# Patient Record
Sex: Female | Born: 1990 | Race: Black or African American | Hispanic: Yes | Marital: Single | Smoking: Former smoker
Health system: Southern US, Community
[De-identification: ages and names within clinical notes are randomized; demographics above are authoritative.]

## PROBLEM LIST (undated history)

## (undated) ENCOUNTER — Inpatient Hospital Stay (HOSPITAL_COMMUNITY): Payer: Self-pay

## (undated) DIAGNOSIS — N739 Female pelvic inflammatory disease, unspecified: Secondary | ICD-10-CM

## (undated) DIAGNOSIS — F32A Depression, unspecified: Secondary | ICD-10-CM

## (undated) DIAGNOSIS — L509 Urticaria, unspecified: Secondary | ICD-10-CM

## (undated) DIAGNOSIS — F419 Anxiety disorder, unspecified: Secondary | ICD-10-CM

## (undated) DIAGNOSIS — Z8619 Personal history of other infectious and parasitic diseases: Secondary | ICD-10-CM

## (undated) DIAGNOSIS — G43909 Migraine, unspecified, not intractable, without status migrainosus: Secondary | ICD-10-CM

## (undated) DIAGNOSIS — D649 Anemia, unspecified: Secondary | ICD-10-CM

## (undated) DIAGNOSIS — F329 Major depressive disorder, single episode, unspecified: Secondary | ICD-10-CM

## (undated) DIAGNOSIS — B009 Herpesviral infection, unspecified: Secondary | ICD-10-CM

## (undated) DIAGNOSIS — M1990 Unspecified osteoarthritis, unspecified site: Secondary | ICD-10-CM

## (undated) DIAGNOSIS — R064 Hyperventilation: Secondary | ICD-10-CM

## (undated) DIAGNOSIS — T783XXA Angioneurotic edema, initial encounter: Secondary | ICD-10-CM

## (undated) DIAGNOSIS — J45909 Unspecified asthma, uncomplicated: Secondary | ICD-10-CM

## (undated) HISTORY — DX: Unspecified asthma, uncomplicated: J45.909

## (undated) HISTORY — DX: Major depressive disorder, single episode, unspecified: F32.9

## (undated) HISTORY — DX: Urticaria, unspecified: L50.9

## (undated) HISTORY — DX: Personal history of other infectious and parasitic diseases: Z86.19

## (undated) HISTORY — DX: Migraine, unspecified, not intractable, without status migrainosus: G43.909

## (undated) HISTORY — DX: Angioneurotic edema, initial encounter: T78.3XXA

## (undated) HISTORY — DX: Unspecified osteoarthritis, unspecified site: M19.90

## (undated) HISTORY — DX: Female pelvic inflammatory disease, unspecified: N73.9

## (undated) HISTORY — DX: Depression, unspecified: F32.A

## (undated) HISTORY — DX: Herpesviral infection, unspecified: B00.9

---
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
## 2012-01-08 ENCOUNTER — Encounter (HOSPITAL_COMMUNITY): Payer: Self-pay | Admitting: Cardiology

## 2012-01-08 ENCOUNTER — Emergency Department (HOSPITAL_COMMUNITY)
Admission: EM | Admit: 2012-01-08 | Discharge: 2012-01-08 | Disposition: A | Discharge status: Home / Self Care | Payer: Medicaid Other | Source: Home / Self Care

## 2012-01-08 DIAGNOSIS — R531 Weakness: Secondary | ICD-10-CM

## 2012-01-08 DIAGNOSIS — R5383 Other fatigue: Secondary | ICD-10-CM

## 2012-01-08 HISTORY — DX: Anemia, unspecified: D64.9

## 2012-01-08 HISTORY — DX: Hyperventilation: R06.4

## 2012-01-08 MED ORDER — IBUPROFEN 800 MG PO TABS: 800.0000 mg | ORAL_TABLET | Freq: Three times a day (TID) | ORAL | Status: AC

## 2012-01-08 MED ORDER — IBUPROFEN 800 MG PO TABS
800.0000 mg | ORAL_TABLET | Freq: Once | ORAL | Status: AC
  Administered 2012-01-08: 800 mg via ORAL

## 2012-01-08 MED ORDER — IBUPROFEN 800 MG PO TABS
ORAL_TABLET | ORAL | Status: AC
  Filled 2012-01-08: qty 1

## 2012-01-08 NOTE — ED Provider Notes (Signed)
History     CSN: [id number]  Arrival date & time 01/08/12  1907   None     Chief Complaint  Patient presents with  . Near Syncope  . Cough  . Chills    (Consider location/radiation/quality/duration/timing/severity/associated sxs/prior treatment) Patient is a 21 y.o. female presenting with cough. The history is provided by the patient. No language interpreter was used.  Cough This is a new problem. The current episode started 1 to 2 hours ago. The problem has been gradually worsening. The cough is non-productive. There has been no fever. Associated symptoms include chills and sweats. Pertinent negatives include no shortness of breath. The treatment provided no relief. She is not a smoker. Her past medical history is significant for pneumonia.  hx of anemia,  No medical doctor,  Pt stopped taking vitamins.  Pt reports she has not been eating or drinking normally,  Pt began feeling bad at work [employer].  Pt felt like she would pass out.   Past Medical History  Diagnosis Date  . Anemia   . Hyperventilation     History reviewed. No pertinent past surgical history.  Family History  Problem Relation Age of Onset  . Hypertension Other   . Diabetes Other   . Cancer Other     History  Substance Use Topics  . Smoking status: Never Smoker   . Smokeless tobacco: Not on file  . Alcohol Use: No    OB History    Grav Para Term Preterm Abortions TAB SAB Ect Mult Living                  Review of Systems  Constitutional: Positive for chills.  Respiratory: Positive for cough. Negative for shortness of breath.     Allergies  Review of patient's allergies indicates no known allergies.  Home Medications   Current Outpatient Rx  Name Route Sig Dispense Refill  . MULTI-VITAMIN/MINERALS PO TABS Oral Take 1 tablet by mouth daily.      Marland Kitchen NORGESTIMATE-ETH ESTRADIOL 0.25-35 MG-MCG PO TABS Oral Take 1 tablet by mouth daily.        BP 117/68  Pulse 79  Temp(Src) 98.2 F (36.8 C)  (Oral)  Resp 22  SpO2 100%  LMP 01/01/2012  Physical Exam  Nursing note and vitals reviewed. Constitutional: She is oriented to person, place, and time. She appears well-developed and well-nourished.  HENT:  Head: Normocephalic and atraumatic.  Right Ear: External ear normal.  Mouth/Throat: Oropharynx is clear and moist.  Eyes: Conjunctivae and EOM are normal. Pupils are equal, round, and reactive to light.  Neck: Normal range of motion.  Cardiovascular: Normal rate and normal heart sounds.   Pulmonary/Chest: Effort normal.  Abdominal: Soft.  Musculoskeletal: Normal range of motion.  Neurological: She is alert and oriented to person, place, and time.  Psychiatric: She has a normal mood and affect.    ED Course  Procedures (including critical care time)  Labs Reviewed - No data to display No results found.   No diagnosis found.    MDM  Pt given ibuprofen,  I advised her she needs a primary MD Pt given referral number       Langston Masker, [place] 01/08/12 2110

## 2012-01-08 NOTE — ED Notes (Signed)
Pt states she was at work started feeling tired and hot. Had episode of hyperventilation. Pt had chills and head started hurting. Has history of hyperventilation.  Had sore throat last week. Has occasional cough with some mucus.

## 2012-01-10 NOTE — ED Provider Notes (Signed)
Dx: weakness Medical screening examination/treatment/procedure(s) were performed by non-physician practitioner and as supervising physician I was immediately available for consultation/collaboration.  Luiz Blare MD   Luiz Blare, MD 01/10/12 [phone]

## 2012-06-26 ENCOUNTER — Encounter (HOSPITAL_COMMUNITY): Payer: Self-pay | Admitting: Emergency Medicine

## 2012-06-26 ENCOUNTER — Emergency Department (INDEPENDENT_AMBULATORY_CARE_PROVIDER_SITE_OTHER)
Admission: EM | Admit: 2012-06-26 | Discharge: 2012-06-26 | Disposition: A | Discharge status: Home / Self Care | Payer: Self-pay | Source: Home / Self Care | Attending: Family Medicine | Admitting: Family Medicine

## 2012-06-26 DIAGNOSIS — H00029 Hordeolum internum unspecified eye, unspecified eyelid: Secondary | ICD-10-CM

## 2012-06-26 MED ORDER — MOXIFLOXACIN HCL 0.5 % OP SOLN: 1.0000 [drp] | Freq: Three times a day (TID) | OPHTHALMIC | Status: AC

## 2012-06-26 MED ORDER — MOXIFLOXACIN HCL 0.5 % OP SOLN: 1.0000 [drp] | Freq: Three times a day (TID) | OPHTHALMIC | Status: DC

## 2012-06-26 NOTE — ED Notes (Signed)
Eye pain onset Sunday night.  Has been using warm compresses

## 2012-06-26 NOTE — ED Provider Notes (Signed)
History     CSN: [id number]  Arrival date & time 06/26/12  1244   First MD Initiated Contact with Patient 06/26/12 1330      No chief complaint on file.   (Consider location/radiation/quality/duration/timing/severity/associated sxs/prior treatment) Patient is a 21 y.o. female presenting with eye problem. The history is provided by the patient and a parent.  Eye Problem  This is a new problem. The current episode started more than 2 days ago. The problem has been gradually worsening. There is pain in the right eye. There was no injury mechanism. The pain is mild. There is no history of trauma to the eye. There is no known exposure to pink eye. She does not wear contacts. Associated symptoms include discharge, eye redness and itching. Pertinent negatives include no blurred vision, no decreased vision, no photophobia, no nausea and no vomiting. She has tried water for the symptoms. The treatment provided mild relief.    Past Medical History  Diagnosis Date  . Anemia   . Hyperventilation     No past surgical history on file.  Family History  Problem Relation Age of Onset  . Hypertension Other   . Diabetes Other   . Cancer Other     History  Substance Use Topics  . Smoking status: Never Smoker   . Smokeless tobacco: Not on file  . Alcohol Use: No    OB History    Grav Para Term Preterm Abortions TAB SAB Ect Mult Living                  Review of Systems  Constitutional: Negative.   HENT: Negative.   Eyes: Positive for discharge, redness and itching. Negative for blurred vision, photophobia, pain and visual disturbance.  Gastrointestinal: Negative for nausea and vomiting.  Skin: Positive for itching.    Allergies  Review of patient's allergies indicates no known allergies.  Home Medications   Current Outpatient Rx  Name Route Sig Dispense Refill  . MOXIFLOXACIN HCL 0.5 % OP SOLN Right Eye Place 1 drop into the right eye 3 (three) times daily. Use after warm  compress to eye. 3 mL 0  . MULTI-VITAMIN/MINERALS PO TABS Oral Take 1 tablet by mouth daily.      Marland Kitchen NORGESTIMATE-ETH ESTRADIOL 0.25-35 MG-MCG PO TABS Oral Take 1 tablet by mouth daily.        There were no vitals taken for this visit.  Physical Exam  Nursing note and vitals reviewed. Constitutional: She appears well-developed and well-nourished.  HENT:  Head: Normocephalic.  Right Ear: External ear normal.  Left Ear: External ear normal.  Mouth/Throat: Oropharynx is clear and moist.  Eyes: EOM are normal. Pupils are equal, round, and reactive to light. No foreign bodies found. Right eye exhibits discharge and exudate. Left eye exhibits no discharge and no exudate. Right conjunctiva is injected. Left conjunctiva is not injected.    ED Course  Procedures (including critical care time)  Labs Reviewed - No data to display No results found.   1. Sty, internal       MDM          Linna Hoff, MD 06/26/12 1425

## 2012-06-26 NOTE — ED Notes (Signed)
Call from patient, requesting less costly Rx , and to have it called in to [facility]. Spoke w Dr Caron Presume, who authorized tobrex opth solution, 1 drop , TID to her affected eye, after a warm compress. Left message on voice mail at pharmacy

## 2012-07-03 IMAGING — US US PELVIS COMPLETE
1 series · 13 of 25 positions shown · non-contrast
Comparison: CT 07/24/2011

CLINICAL DATA: Lower abdominal pain.  Left ovarian cyst on CT

TRANSABDOMINAL AND TRANSVAGINAL ULTRASOUND OF PELVIS
DOPPLER ULTRASOUND OF OVARIES
TECHNIQUE: Both transabdominal and transvaginal ultrasound
examinations of the pelvis were performed. Transabdominal technique
was performed for global imaging of the pelvis including uterus,
ovaries, adnexal regions, and pelvic cul-de-sac.
It was necessary to proceed with endovaginal exam following the
transabdominal exam to visualize the ovaries and uterus.
Color and duplex Doppler ultrasound was utilized to evaluate blood
flow to the ovaries.

[Series 1: us pelvis complete · 0.30mm/px · 13 of 42 slices shown]
[im 1/42]
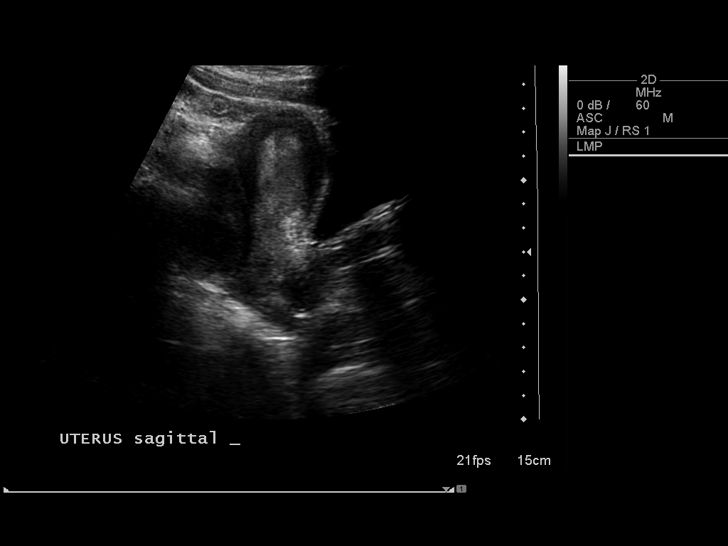
[im 4/42]
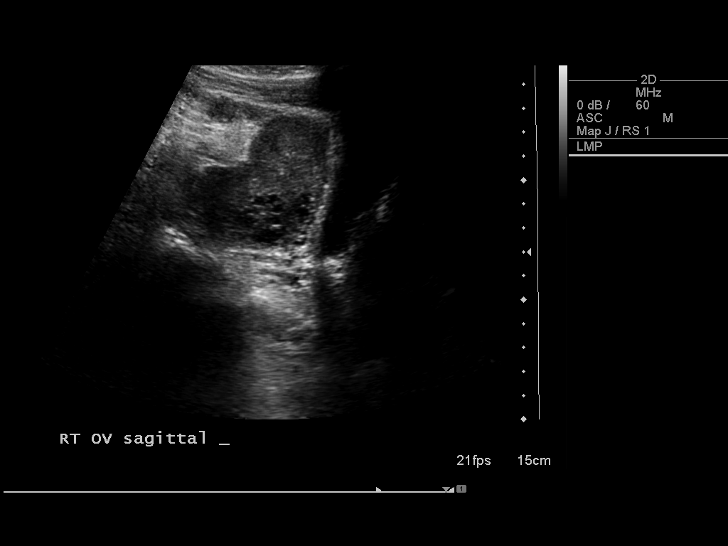
[im 7/42]
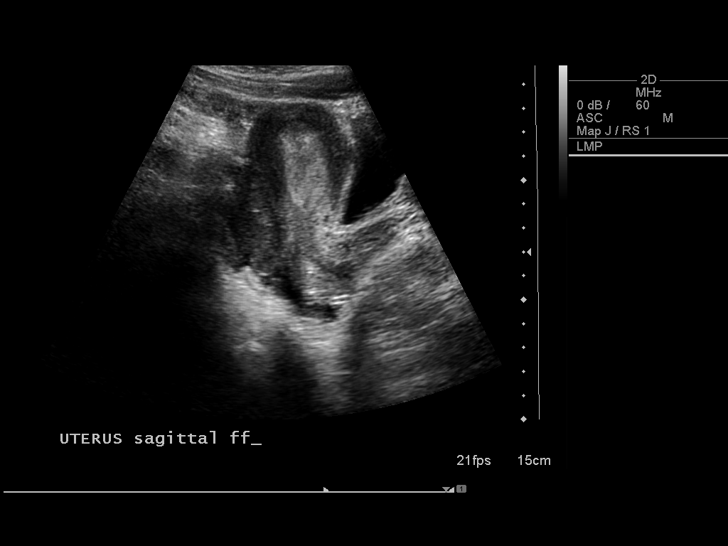
[im 11/42]
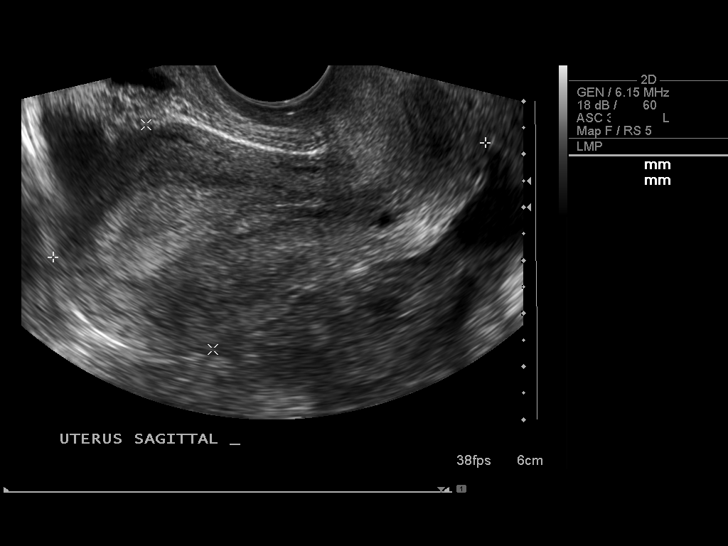
[im 14/42]
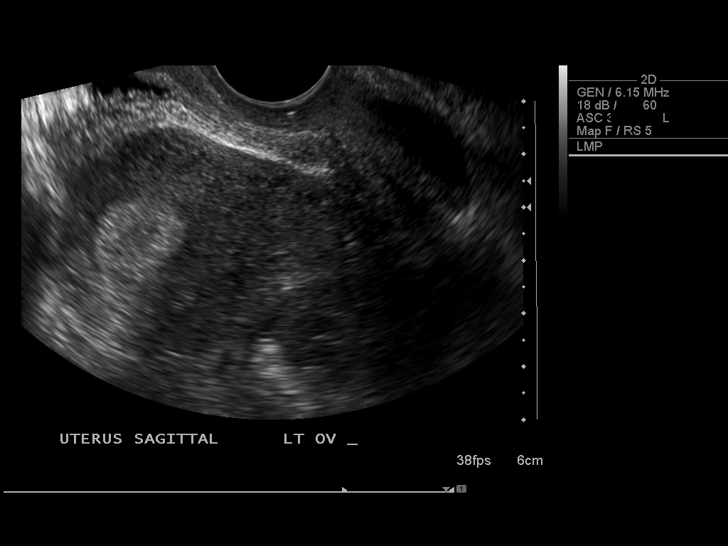
[im 18/42]
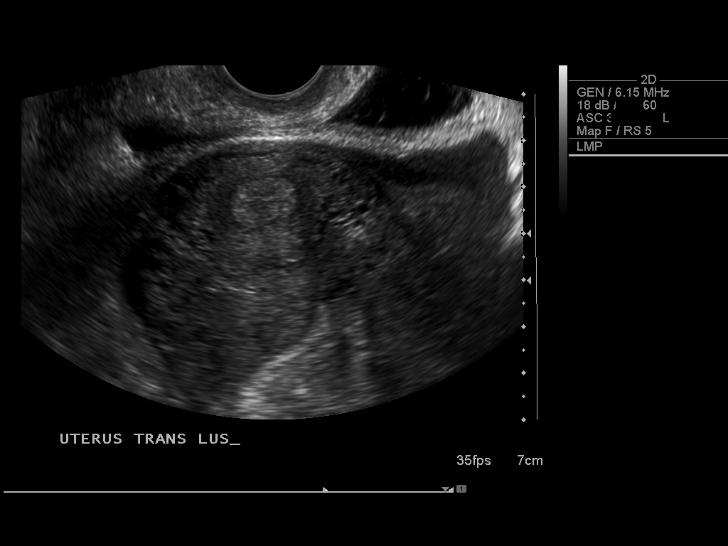
[im 21/42]
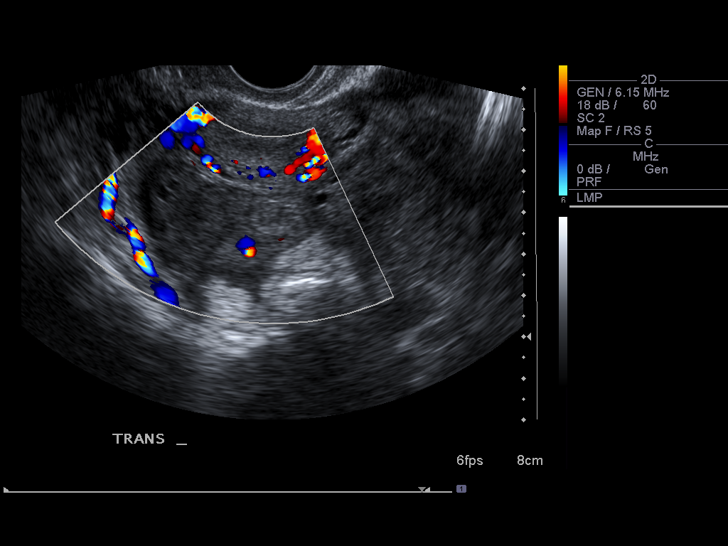
[im 24/42]
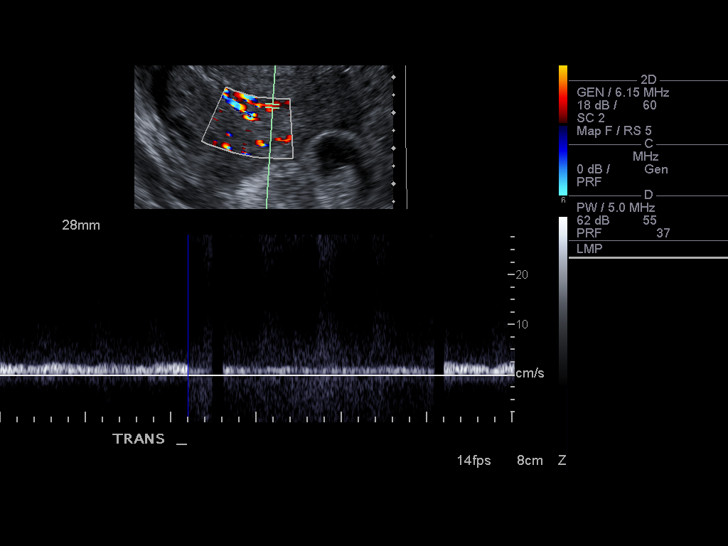
[im 28/42]
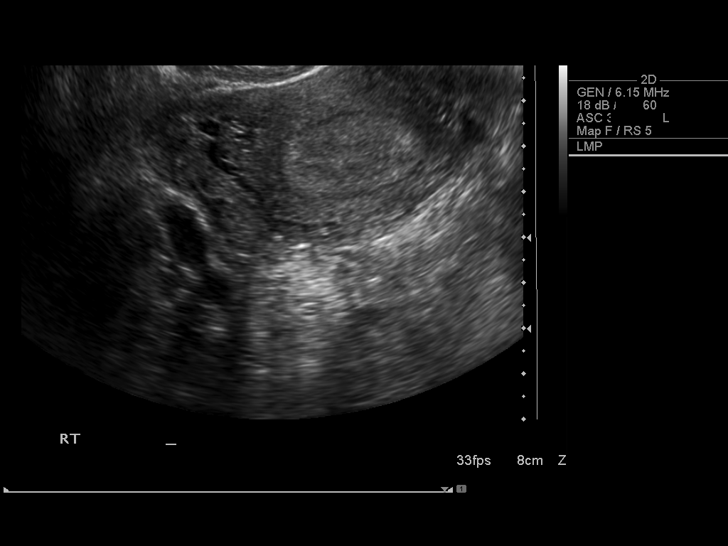
[im 31/42]
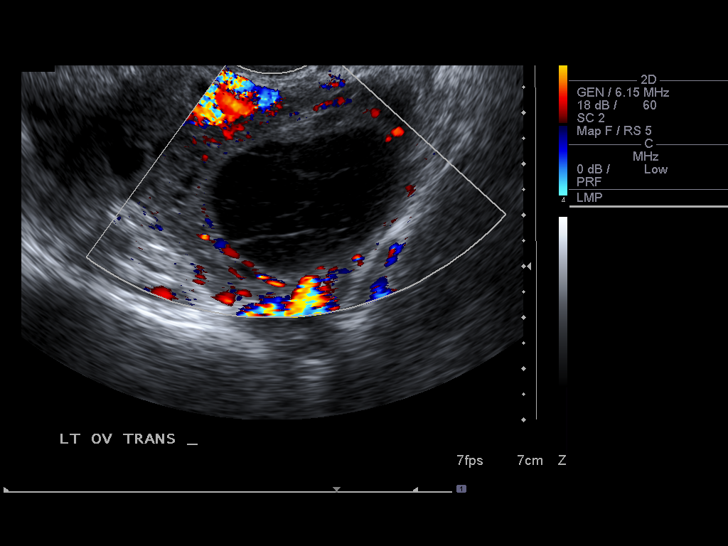
[im 35/42]
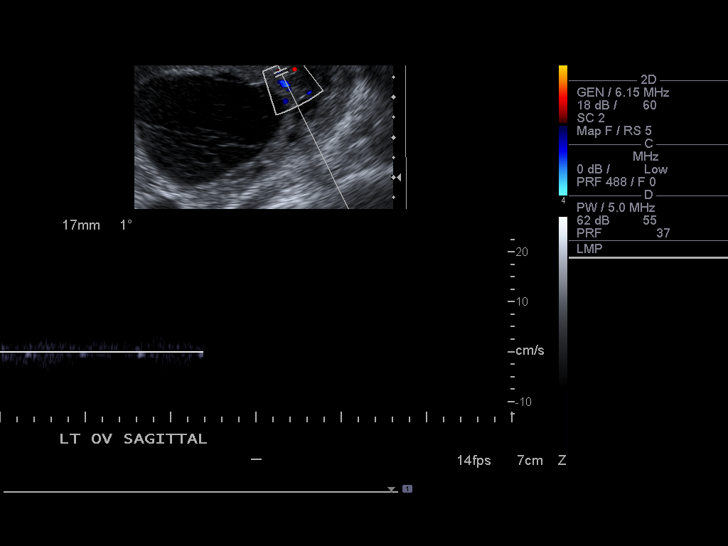
[im 38/42]
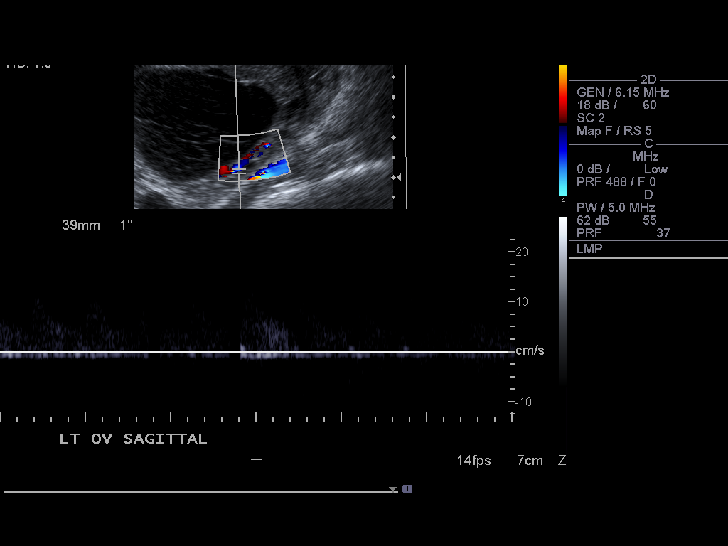
[im 42/42]
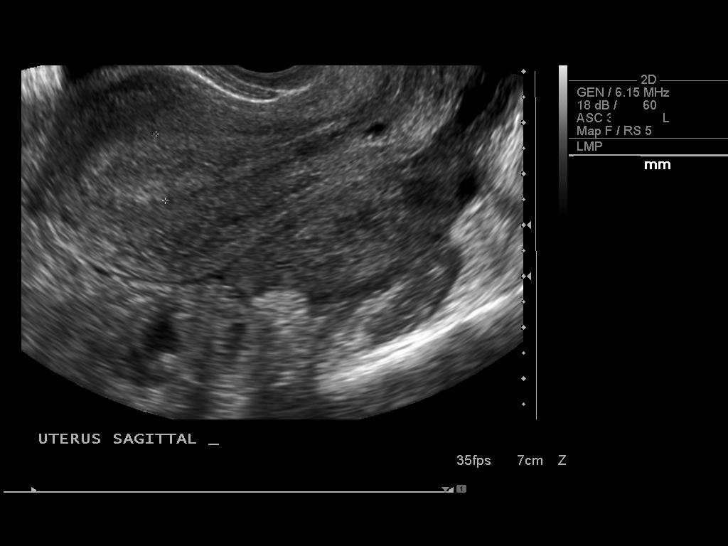

[13 of 25 positions shown; findings below may reference images not displayed]

FINDINGS: Uterus:  Normal in size and appearance

Endometrium:  Thickened endometrium measuring 1.6 cm.  Endometrium
is homogeneous.

Right ovary: Normal appearance/no adnexal mass

Left ovary:   Complex cyst left ovary measures 4 x 4.1 cm.
Internal septations are present.

Pulsed Doppler evaluation demonstrates normal low-resistance
arterial and venous waveforms in both ovaries.

Small amount of free fluid.
IMPRESSION: 4 cm complex cyst left ovary with a small amount of free fluid.
This is most likely a functional cyst.

No sonographic evidence for ovarian torsion.

## 2012-07-09 ENCOUNTER — Other Ambulatory Visit: Payer: Self-pay | Admitting: Obstetrics & Gynecology

## 2012-07-09 ENCOUNTER — Ambulatory Visit (INDEPENDENT_AMBULATORY_CARE_PROVIDER_SITE_OTHER): Payer: Medicaid Other | Admitting: Obstetrics & Gynecology

## 2012-07-09 ENCOUNTER — Encounter: Payer: Self-pay | Admitting: Obstetrics & Gynecology

## 2012-07-09 VITALS — BP 97/67 | HR 76 | Temp 98.1°F | Ht 64.0 in | Wt 149.9 lb

## 2012-07-09 DIAGNOSIS — N949 Unspecified condition associated with female genital organs and menstrual cycle: Secondary | ICD-10-CM

## 2012-07-09 DIAGNOSIS — R102 Pelvic and perineal pain: Secondary | ICD-10-CM

## 2012-07-09 DIAGNOSIS — N730 Acute parametritis and pelvic cellulitis: Secondary | ICD-10-CM

## 2012-07-09 DIAGNOSIS — N739 Female pelvic inflammatory disease, unspecified: Secondary | ICD-10-CM

## 2012-07-09 MED ORDER — CEFTRIAXONE SODIUM 250 MG IJ SOLR: 250.0000 mg | Freq: Once | INTRAMUSCULAR | Status: DC

## 2012-07-09 MED ORDER — CEFTRIAXONE SODIUM 1 G IJ SOLR
250.0000 mg | Freq: Once | INTRAMUSCULAR | Status: AC
  Administered 2012-07-09: 250 mg via INTRAMUSCULAR

## 2012-07-09 MED ORDER — AZITHROMYCIN 250 MG PO TABS: 1000.0000 mg | ORAL_TABLET | Freq: Once | ORAL | Status: AC

## 2012-07-09 NOTE — Patient Instructions (Signed)
Pelvic Inflammatory Disease  Pelvic Inflammatory Disease (PID) is an infection in some or all of your female organs. This includes the womb (uterus), ovaries, fallopian tubes and tissues in the pelvis. PID is a common cause of sudden onset (acute) lower abdominal (pelvic) pain. PID can be treated, but it is a serious infection. It may take weeks before you are completely well. In some cases, hospitalization is needed for surgery or to administer medications to kill germs (antibiotics) through your veins (intravenously).  CAUSES    It may be caused by germs that are spread during sexual contact.   PID can also occur following:   The birth of a baby.   A miscarriage.   An abortion.   Major surgery of the pelvis.   Use of an IUD.   Sexual assault.  SYMPTOMS    Abdominal or pelvic pain.   Fever.   Chills.   Abnormal vaginal discharge.  DIAGNOSIS   Your caregiver will choose some of these methods to make a diagnosis:   A physical exam and history.   Blood tests.   Cultures of the vagina and cervix.   X-rays or ultrasound.   A procedure to look inside the pelvis (laparoscopy).  TREATMENT    Use of antibiotics by mouth or intravenously.   Treatment of sexual partners when the infection is an sexually transmitted disease (STD).   Hospitalization and surgery may be needed.  RISKS AND COMPLICATIONS    PID can cause women to become unable to have children (sterile) if left untreated or if partially treated. That is why it is important to finish all medications given to you.   Sterility or future tubal (ectopic) pregnancies can occur in fully treated individuals. This is why it is so important to follow your prescribed treatment.   It can cause longstanding (chronic) pelvic pain after frequent infections.   Painful intercourse.   Pelvic abscesses.   In rare cases, surgery or a hysterectomy may be needed.   If this is a sexually transmitted infection (STI), you are also at risk for any other STD  including AIDSor human papillomavirus (HPV).  HOME CARE INSTRUCTIONS    Finish all medication as prescribed. Incomplete treatment will put you at risk for sterility and tubal pregnancy.   Only take over-the-counter or prescription medicines for pain, discomfort, or fever as directed by your caregiver.   Do not have sex until treatment is completed or as directed by your caregiver. If PID is confirmed, your recent sexual contacts will need treatment.   Keep your follow-up appointments.  SEEK MEDICAL CARE IF:    You have increased or abnormal vaginal discharge.   You need prescription medication for your pain.   Your partner has an STD.   You are vomiting.   You cannot take your medications.  SEEK IMMEDIATE MEDICAL CARE IF:    You have a fever.   You develop increased abdominal or pelvic pain.   You develop chills.   You have pain when you urinate.   You are not better after 72 hours following treatment.  Document Released: 12/09/2005 Document Revised: 11/28/2011 Document Reviewed: 08/22/2007  [employer] Patient Information 2012 [employer]

## 2012-07-09 NOTE — Progress Notes (Signed)
Subjective:     Patient ID: Shelby Graves, female   DOB: January 03, 1991, 21 y.o.   MRN: [id number]  HPI Patient is a 21 yo woman with PMH of functional left ovarian cyst who presents with left sided pelvic pain and irregular menses. Patient reports that July 2012 she was diagnosed with functional L ovarian cyst and given ortho-cyclen. Her menses corrected and her pelvic pain resolved, however 2/2 headaches and blurry vision she stopped taking the birth control in December 2012. Patient then reports her periods became irregular again and she began having heavy bleeding with clots alternating with spotty bleeding. She currently reports continued left pelvic pain that is constant in nature, 6/10 and nothing seems to make the pain better or worse. She has not tried taking anything for the pain as well. She does not report the pain being precipitated by anything and the pain feels the same, not better nor worse. She denies any fevers, chills, night sweats, N/V/D, bladder or bowel changes. Patient also reports in early 2012 (approx. February/March) she began gaining weight and developing irregular hair growth. Over the past 1 yr she has gained approx. 30 lbs and denies any changes in diet or physical activity. She has also been tweezing the irregular hair growth on her neck and chest.   Review of Systems  Constitutional: Negative.   Eyes: Negative.   Respiratory: Negative.   Cardiovascular: Negative.   Gastrointestinal: Negative.   Genitourinary: Positive for vaginal bleeding, vaginal discharge and pelvic pain ( Left). Negative for difficulty urinating.  Musculoskeletal: Negative.   Neurological: Positive for headaches.  Hematological: Negative.   Psychiatric/Behavioral: Negative.        Objective:   Physical Exam  Constitutional: She is oriented to person, place, and time. She appears well-developed and well-nourished.  HENT:  Head: Normocephalic and atraumatic.  Eyes: Conjunctivae are normal.    Neck: Normal range of motion. Neck supple.  Cardiovascular: Normal rate, regular rhythm and normal heart sounds.  Exam reveals no friction rub.   No murmur heard. Pulmonary/Chest: Effort normal and breath sounds normal.  Abdominal: Soft. Bowel sounds are normal.  Genitourinary: Cervix exhibits motion tenderness and discharge ( blood at os). Right adnexum displays tenderness and fullness. Left adnexum displays tenderness and fullness. Vaginal discharge found.  Musculoskeletal: Normal range of motion.  Neurological: She is alert and oriented to person, place, and time.  Skin: Skin is warm and dry.  Psychiatric: She has a normal mood and affect. Her behavior is normal. Judgment and thought content normal.       Assessment:     1) PID - patient has B/L adnexal tenderness and CMT, recent sexual intercourse. GC/CHL and UPT obtained. 2) Possible PCOS - will treat for PID first, then re-evaluate PCOS at follow-up    Plan:     1) PID - Rocephin IM x 1 in clinic, Rx sent for Azithromycin 1000mg  x 1 2) [place], pelvic for assessment of pelvic pain/adnexal tenderness

## 2012-07-10 ENCOUNTER — Ambulatory Visit (HOSPITAL_COMMUNITY)
Admission: RE | Admit: 2012-07-10 | Discharge: 2012-07-10 | Disposition: A | Discharge status: Home / Self Care | Payer: Medicaid Other | Source: Ambulatory Visit | Attending: Obstetrics & Gynecology | Admitting: Obstetrics & Gynecology

## 2012-07-10 DIAGNOSIS — R102 Pelvic and perineal pain: Secondary | ICD-10-CM

## 2012-07-10 DIAGNOSIS — N949 Unspecified condition associated with female genital organs and menstrual cycle: Secondary | ICD-10-CM | POA: Insufficient documentation

## 2012-07-10 LAB — GC/CHLAMYDIA PROBE AMP, GENITAL: Chlamydia, DNA Probe: NEGATIVE

## 2012-07-13 ENCOUNTER — Ambulatory Visit (INDEPENDENT_AMBULATORY_CARE_PROVIDER_SITE_OTHER): Payer: Medicaid Other | Admitting: Obstetrics and Gynecology

## 2012-07-13 ENCOUNTER — Encounter: Payer: Self-pay | Admitting: Obstetrics and Gynecology

## 2012-07-13 VITALS — BP 110/62 | Ht 64.0 in | Wt 151.0 lb

## 2012-07-13 DIAGNOSIS — N898 Other specified noninflammatory disorders of vagina: Secondary | ICD-10-CM

## 2012-07-13 DIAGNOSIS — E559 Vitamin D deficiency, unspecified: Secondary | ICD-10-CM

## 2012-07-13 DIAGNOSIS — N921 Excessive and frequent menstruation with irregular cycle: Secondary | ICD-10-CM | POA: Insufficient documentation

## 2012-07-13 DIAGNOSIS — N920 Excessive and frequent menstruation with regular cycle: Secondary | ICD-10-CM

## 2012-07-13 LAB — POCT WET PREP (WET MOUNT)
Bacteria Wet Prep HPF POC: NEGATIVE
KOH Wet Prep POC: NEGATIVE
pH: 5

## 2012-07-13 LAB — CBC
HCT: 34.2 % — ABNORMAL LOW (ref 36.0–46.0)
Platelets: 354 10*3/uL (ref 150–400)
RDW: 16.4 % — ABNORMAL HIGH (ref 11.5–15.5)
WBC: 6.3 10*3/uL (ref 4.0–10.5)

## 2012-07-13 NOTE — Progress Notes (Signed)
C/o vag d/c with vaginal itching since earlier this yr off and on.  Thinks she has recurrent yeast infxns. C/o heavy cycles since January some with pain.  Tried ortho cyclen but gave her bad migraines and she has no history of headaches or migraines.  Filed Vitals:   07/13/12 0953  BP: 110/62   ROS: noncontributory  Pelvic exam:  VULVA: normal appearing vulva with no masses, tenderness or lesions,  VAGINA: normal appearing vagina with normal color and discharge, no lesions, CERVIX: normal appearing cervix without discharge or lesions,  UTERUS: uterus is normal size, shape, consistency and nontender,  ADNEXA: normal adnexa in size, nontender and no masses.  Results for orders placed in visit on 07/09/12  GC/CHLAMYDIA PROBE AMP, GENITAL      Component Value Range   Chlamydia, DNA Probe NEGATIVE     GC Probe Amp, Genital NEGATIVE    POCT PREGNANCY, URINE      Component Value Range   Preg Test, Ur NEGATIVE  NEGATIVE   Results for orders placed in visit on 07/13/12  POCT WET PREP (WET MOUNT)      Component Value Range   Source Wet Prep POC       WBC, Wet Prep HPF POC       Bacteria Wet Prep HPF POC Negative     BACTERIA WET PREP MORPHOLOGY POC       Clue Cells Wet Prep HPF POC None     CLUE CELLS WET PREP WHIFF POC Negative Whiff     Yeast Wet Prep HPF POC None     KOH Wet Prep POC Negative     Trichomonas Wet Prep HPF POC Negative     pH 5.0       A/P Pt had u/s on 7/19 - will try to get report Labs today Wet prep Trial of rephresh and reeval sxs (itching) at [facility]

## 2012-07-14 LAB — VITAMIN D 25 HYDROXY (VIT D DEFICIENCY, FRACTURES): Vit D, 25-Hydroxy: 24 ng/mL — ABNORMAL LOW (ref 30–89)

## 2012-07-14 LAB — TSH: TSH: 1.026 u[IU]/mL (ref 0.350–4.500)

## 2012-07-16 ENCOUNTER — Ambulatory Visit (INDEPENDENT_AMBULATORY_CARE_PROVIDER_SITE_OTHER): Payer: Self-pay | Admitting: Family Medicine

## 2012-07-16 ENCOUNTER — Encounter: Payer: Self-pay | Admitting: Family Medicine

## 2012-07-16 VITALS — BP 104/71 | HR 74 | Ht 65.0 in | Wt 148.0 lb

## 2012-07-16 DIAGNOSIS — N920 Excessive and frequent menstruation with regular cycle: Secondary | ICD-10-CM

## 2012-07-16 DIAGNOSIS — R109 Unspecified abdominal pain: Secondary | ICD-10-CM | POA: Insufficient documentation

## 2012-07-16 DIAGNOSIS — K3189 Other diseases of stomach and duodenum: Secondary | ICD-10-CM

## 2012-07-16 DIAGNOSIS — E559 Vitamin D deficiency, unspecified: Secondary | ICD-10-CM | POA: Insufficient documentation

## 2012-07-16 DIAGNOSIS — D509 Iron deficiency anemia, unspecified: Secondary | ICD-10-CM | POA: Insufficient documentation

## 2012-07-16 DIAGNOSIS — G43109 Migraine with aura, not intractable, without status migrainosus: Secondary | ICD-10-CM | POA: Insufficient documentation

## 2012-07-16 LAB — POCT URINALYSIS DIPSTICK
Leukocytes, UA: NEGATIVE
Protein, UA: NEGATIVE
Urobilinogen, UA: 0.2

## 2012-07-16 LAB — CBC
MCHC: 33.5 g/dL (ref 30.0–36.0)
Platelets: 348 10*3/uL (ref 150–400)
RDW: 16.1 % — ABNORMAL HIGH (ref 11.5–15.5)

## 2012-07-16 MED ORDER — AMITRIPTYLINE HCL 25 MG PO TABS: 25.0000 mg | ORAL_TABLET | Freq: Every day | ORAL | Status: DC

## 2012-07-16 MED ORDER — DOCUSATE SODIUM 100 MG PO CAPS: 100.0000 mg | ORAL_CAPSULE | Freq: Two times a day (BID) | ORAL | Status: AC | PRN

## 2012-07-16 MED ORDER — CALCIUM-VITAMIN D 500-200 MG-UNIT PO TABS: 1.0000 | ORAL_TABLET | Freq: Two times a day (BID) | ORAL | Status: DC

## 2012-07-16 MED ORDER — FERROUS SULFATE 325 (65 FE) MG PO TABS: 325.0000 mg | ORAL_TABLET | Freq: Two times a day (BID) | ORAL | Status: DC

## 2012-07-16 NOTE — Assessment & Plan Note (Signed)
A: discussed that this and polymenorrhea is common in women her age.  Patient with normal transvaginal ultrasounds, normal uterine stripe and negative u preg. Likelihood of endometrial pathology very low given age and lack of risk factors. Endometrial biopsy low yield and not work risk at this time.  P:  Check coags at f/u.  patient to consider taking additional birth control (depo, nexplanon, IUD) to improve symptoms.  Will hold off on treatment of this until f/u.

## 2012-07-16 NOTE — Assessment & Plan Note (Signed)
A: chronic daily migraine. No evidence of intracranial pathology or pseudotumor cerebri. P:  Start elavil Avoid excessive motrin use as this may trigger rebound headaches.

## 2012-07-16 NOTE — Assessment & Plan Note (Signed)
A: unclear etiology. Reassuring exam. Differential include UTI (negative UA), GERD, biliary colic, dysmenorrhea, abdominal migraines, pancreatitis and appendicitis. Pancreatitis and appendicitis highly unlikely due to lack of fever, prolonged course and normal exam.  P: -check CBC, CMP, -consider lipase  -f/u based on labs.

## 2012-07-16 NOTE — Patient Instructions (Signed)
Debbe,  Thank you for coming in to see me today.  1. For headaches: start elavil. Take 1 tab nightly. Try to not take ibuprofen for next week or so as to avoid rebound headaches. 2. For anemia: start iron work up to twice daily. Take the stool softener colace, if you develop constipation.  3. For stomach pain:  We will start with labs today. Based on labs we will come up with a treatment plan. This may be pain related to your period. If you develop fever, vomiting or severe please call and come in.   F/u with me in 2 weeks.   Dr. Armen Pickup

## 2012-07-16 NOTE — Progress Notes (Signed)
Subjective:     Patient ID: Shelby Graves, female   DOB: 1991/08/18, 21 y.o.   MRN: [id number]  HPI 21 yo F new patient presents to establish primary care. She previously received medical care at urgent care. She would like to discuss the following:  1. Frequent headaches: usually unilateral headaches increasing in frequency since the last month. Started in October 2012. She initially associated with taking OCPs so she stopped taking them. Has used motrin in the past w/o significant relief. Headaches occur daily. Precipitated by mood lability. Associated with fatigue.She denies focal weakness or sensory changes including vision changes. She denies head trauma or injury.   2. Anemia: low Hgb. Took iron in the past but it caused diarrhea so she stopped. Admit to fatigue,  SOB and DOE but this is getting better as she is working on conditioning. Denies palpitations, presyncope and syncope.   3. Heavy menses: since Jan 2013. Two period per month. Associated with cramping and heavy bleeding. Menarches age 41. Periods last 7 days. She is not sexually active. Currently bleeding.   4. Periumbilical pain: x 2 weeks. Exacerbated by food. Associated with nausea. No vomiting or fever. Bowels range from constipation to loose. Currently loose. Denies blood in stool.   5. Vit D deficiency: noted on recent blood work. Patient drinks milk.  Past Medical History  Diagnosis Date  . Hyperventilation   . Anemia 2012  . Migraine 2010  . Ovarian cyst 2012    Diagnosed on pelvic ultrasound. F/u Ultrasound 06/2012 revealed no cyst.   . PID (pelvic inflammatory disease) 2012    GC and chlamydia   . Asthma 2009    last used inhaler 2009   History reviewed. No pertinent past surgical history.  Family History  Problem Relation Age of Onset  . Hyperlipidemia Mother   . Hypertension Mother   . Kidney disease Mother   . Hypertension Father   . Hypertension Brother   . Hyperlipidemia Brother   . Cancer Maternal  Aunt 58    Ovarian CA  . Diabetes Maternal Grandmother   . Diabetes Maternal Grandfather    History   Social History  . Marital Status: Single    Spouse Name: N/A    Number of Children: N/A  . Years of Education: N/A   Occupational History  . Student      [employer]   Social History Main Topics  . Smoking status: Never Smoker   . Smokeless tobacco: Never Used  . Alcohol Use: No  . Drug Use: No  . Sexually Active: Not Currently    Birth Control/ Protection: Condom   Other Topics Concern  . Not on file   Social History Narrative   Lives alone. Own apartment.    Review of Systems As per HPI +: cough, DOE, N/D/Constipation, abdominal pain, frequent urination at night, pain with urination, weight loss, headaches, excessive thirst  -: fever, night sweat,     Objective:   Physical Exam BP 104/71  Pulse 74  Ht 5\' 5"  (1.651 m)  Wt 148 lb (67.132 kg)  BMI 24.63 kg/m2  LMP 07/04/2012 General appearance: alert, cooperative and no distress Head: Normocephalic, without obvious abnormality, atraumatic discomfort to palpation or R forehead and temple.  Eyes: conjunctivae/corneas clear. PERRL, EOM's intact. Fundi benign. Ears: normal TM's and external ear canals both ears Nose: Nares normal. Septum midline. Mucosa normal. No drainage or sinus tenderness. Throat: lips, mucosa, and tongue normal; teeth and gums normal Neck: no adenopathy, no  carotid bruit, no JVD, supple, symmetrical, trachea midline and thyroid not enlarged, symmetric, no tenderness/mass/nodules Lungs: clear to auscultation bilaterally Heart: regular rate and rhythm, S1, S2 normal, no murmur, click, rub or gallop Abdomen: soft, non-tender; bowel sounds normal; no masses,  no organomegaly discomfort to palpation of periumbilical area w/o rebound or guarding. No RUQ  or RLQ tenderness.  Extremities: extremities normal, atraumatic, no cyanosis or edema Pulses: 2+ and symmetric Skin: Skin color, texture, turgor normal. No  rashes or lesions Neurologic: Grossly normal  Assessment and Plan:

## 2012-07-16 NOTE — Assessment & Plan Note (Signed)
A: suspected based on microcytic anemia. P: check iron levels Start iron with colace as needed.

## 2012-07-16 NOTE — Assessment & Plan Note (Signed)
A: at risk given black race.  P: replete with oral vit D per orders.

## 2012-07-17 ENCOUNTER — Telehealth: Payer: Self-pay | Admitting: Family Medicine

## 2012-07-17 ENCOUNTER — Encounter: Payer: Self-pay | Admitting: Family Medicine

## 2012-07-17 DIAGNOSIS — N920 Excessive and frequent menstruation with regular cycle: Secondary | ICD-10-CM

## 2012-07-17 LAB — COMPREHENSIVE METABOLIC PANEL
CO2: 27 mEq/L (ref 19–32)
Creat: 0.78 mg/dL (ref 0.50–1.10)
Glucose, Bld: 72 mg/dL (ref 70–99)
Sodium: 136 mEq/L (ref 135–145)
Total Bilirubin: 0.5 mg/dL (ref 0.3–1.2)
Total Protein: 7.2 g/dL (ref 6.0–8.3)

## 2012-07-17 LAB — IRON: Iron: 53 ug/dL (ref 42–145)

## 2012-07-17 LAB — FERRITIN: Ferritin: 6 ng/mL — ABNORMAL LOW (ref 10–291)

## 2012-07-17 MED ORDER — FERROUS SULFATE 325 (65 FE) MG PO TABS: 325.0000 mg | ORAL_TABLET | Freq: Three times a day (TID) | ORAL | Status: DC

## 2012-07-17 NOTE — Telephone Encounter (Signed)
Called patient. Left VM. Labs normal except IDA. Plan to work up to taking iron TID with 1/2 glass of orange juice, not with food for best absorption.

## 2012-08-06 ENCOUNTER — Ambulatory Visit (INDEPENDENT_AMBULATORY_CARE_PROVIDER_SITE_OTHER): Payer: Medicaid Other | Admitting: Obstetrics and Gynecology

## 2012-08-06 ENCOUNTER — Encounter: Payer: Self-pay | Admitting: Obstetrics and Gynecology

## 2012-08-06 VITALS — BP 100/62 | Ht 65.0 in | Wt 154.0 lb

## 2012-08-06 DIAGNOSIS — N920 Excessive and frequent menstruation with regular cycle: Secondary | ICD-10-CM

## 2012-08-06 DIAGNOSIS — E559 Vitamin D deficiency, unspecified: Secondary | ICD-10-CM

## 2012-08-06 MED ORDER — VITAMIN D3 1.25 MG (50000 UT) PO CAPS: 50000.0000 [IU] | ORAL_CAPSULE | ORAL | Status: DC

## 2012-08-06 NOTE — Progress Notes (Signed)
Pt seen for f/u today U/S reviewed at outside facility and noted to be wnl with nl size uterus and nl bil ovaries Labs  Filed Vitals:   08/06/12 1002  BP: 100/62   A/P Vit D deficiency - RX vit D Pt declines OCPs secondary to migraines and makes them worse Discussed prog only OCP vs lysteda Pt wants to discuss with her fahter and will call to let me know her decision F/U heavy cycle and meds in 

## 2012-08-12 ENCOUNTER — Ambulatory Visit: Payer: Self-pay | Admitting: Family Medicine

## 2012-09-18 ENCOUNTER — Ambulatory Visit (INDEPENDENT_AMBULATORY_CARE_PROVIDER_SITE_OTHER): Payer: Self-pay | Admitting: Family Medicine

## 2012-09-18 ENCOUNTER — Encounter: Payer: Self-pay | Admitting: Family Medicine

## 2012-09-18 VITALS — BP 102/68 | HR 66 | Temp 98.2°F | Ht 65.0 in | Wt 155.0 lb

## 2012-09-18 DIAGNOSIS — N920 Excessive and frequent menstruation with regular cycle: Secondary | ICD-10-CM

## 2012-09-18 DIAGNOSIS — Z309 Encounter for contraceptive management, unspecified: Secondary | ICD-10-CM

## 2012-09-18 DIAGNOSIS — D509 Iron deficiency anemia, unspecified: Secondary | ICD-10-CM

## 2012-09-18 MED ORDER — DOCUSATE SODIUM 100 MG PO CAPS: 100.0000 mg | ORAL_CAPSULE | Freq: Two times a day (BID) | ORAL | Status: DC

## 2012-09-18 MED ORDER — NORETHINDRONE 0.35 MG PO TABS: 1.0000 | ORAL_TABLET | Freq: Every day | ORAL | Status: DC

## 2012-09-18 NOTE — Assessment & Plan Note (Addendum)
A:  Menometrorrhagia. Normal pelvic U/S 07/10/12. No fibroids.  P: Reassurance. Endometriosis unlikely given cyclical nature of pain. Irreg periods common. Do not worry about infertility until she has been unsuccessful conceiving after one year of actively trying.  Iron daily workup to TID Colace

## 2012-09-18 NOTE — Progress Notes (Signed)
Subjective:     Patient ID: Shelby Graves, female   DOB: Nov 14, 1991, 21 y.o.   MRN: [id number]  HPI 21 yo F presents for f/u visit to discuss the following:  1. Menometrorrhagia: heavy menstrual periods. LMP 08/31/12-09/08/12. Started bleeding again 09/10/12-09/15/12 with clots and cramping. Not compliant with iron. Has L sided pelvic pain that is worsened with intercourse. Wants to know if this could be endometriosis.   2. Contraception: having sex. One partner. Inconsistent condom use. Last unprotected sex yesterday. Reluctant to use contraception out of concern for worsening bleeding, moodiness, weight gain, and infertility risk. Does not desire pregnancy anytime soon. Has not tried to get pregnant (one year of unprotected sex).   3. Weight management: doing yoga class daily for toning. Has gained 7 lbs over the past two months. Often skips lunch and eats a small breakfast. Goal weight 140 lbs.    Review of Systems As per HPI     Objective:   Physical Exam BP 102/68  Pulse 66  Temp 98.2 F (36.8 C) (Oral)  Ht 5\' 5"  (1.651 m)  Wt 155 lb (70.308 kg)  BMI 25.79 kg/m2  LMP 09/11/2012 Wt Readings from Last 3 Encounters:  09/18/12 155 lb (70.308 kg) (84.41%*)  08/06/12 154 lb (69.854 kg) (83.93%*)  07/16/12 148 lb (67.132 kg) (79.19%*)   * Growth percentiles are based on CDC 2-20 Years data.  General appearance: alert, cooperative and no distress     Assessment and Plan:

## 2012-09-18 NOTE — Patient Instructions (Addendum)
Shelby Graves,  Thank you for coming in today  1. For contraception and to help prevent/lighten bleeding between periods: take ortho micronor daily at the same time each day. 2. STD and additional pregnancy prevention: condoms with every sexual encounter. 3. Iron deficiency anemia: iron at least twice daily, goal of three times daily. Colace twice daily to prevent constipation.  4. Weight management: low carb diet  For your diet:  1. Make sure to eat breakfast, lunch and dinner (also may add one snack mid morning or mid afternoon).  2. Carbs: no more than 2 servings (30 gram/2oz) per meal and 1 serving per snack.  3. 1 serving of vegetable with every lunch and dinner.  4. Exercise such that you sweat some and your heart rate goes up most days of the week.  5. Water, water, water   F/u in 2 months (2 cycles) to let me know how the pill is working for you.   Dr. Armen Pickup

## 2012-09-18 NOTE — Assessment & Plan Note (Signed)
Ortho micronor Condoms

## 2012-12-02 ENCOUNTER — Ambulatory Visit (INDEPENDENT_AMBULATORY_CARE_PROVIDER_SITE_OTHER): Payer: No Typology Code available for payment source | Admitting: Family Medicine

## 2012-12-02 ENCOUNTER — Encounter: Payer: Self-pay | Admitting: Family Medicine

## 2012-12-02 VITALS — BP 112/76 | HR 73 | Temp 98.6°F | Ht 65.0 in | Wt 153.3 lb

## 2012-12-02 DIAGNOSIS — H538 Other visual disturbances: Secondary | ICD-10-CM

## 2012-12-02 DIAGNOSIS — N921 Excessive and frequent menstruation with irregular cycle: Secondary | ICD-10-CM

## 2012-12-02 DIAGNOSIS — N920 Excessive and frequent menstruation with regular cycle: Secondary | ICD-10-CM

## 2012-12-02 DIAGNOSIS — D509 Iron deficiency anemia, unspecified: Secondary | ICD-10-CM

## 2012-12-02 DIAGNOSIS — F39 Unspecified mood [affective] disorder: Secondary | ICD-10-CM | POA: Insufficient documentation

## 2012-12-02 DIAGNOSIS — G43109 Migraine with aura, not intractable, without status migrainosus: Secondary | ICD-10-CM

## 2012-12-02 DIAGNOSIS — Z23 Encounter for immunization: Secondary | ICD-10-CM

## 2012-12-02 LAB — CBC
HCT: 36.5 % (ref 36.0–46.0)
RDW: 15.7 % — ABNORMAL HIGH (ref 11.5–15.5)
WBC: 6.4 10*3/uL (ref 4.0–10.5)

## 2012-12-02 LAB — PROLACTIN: Prolactin: 6.3 ng/mL

## 2012-12-02 MED ORDER — AMITRIPTYLINE HCL 50 MG PO TABS: 50.0000 mg | ORAL_TABLET | Freq: Every day | ORAL | Status: DC

## 2012-12-02 MED ORDER — CALCIUM-VITAMIN D 500-200 MG-UNIT PO TABS: 1.0000 | ORAL_TABLET | Freq: Two times a day (BID) | ORAL | Status: DC

## 2012-12-02 MED ORDER — MEDROXYPROGESTERONE ACETATE 10 MG PO TABS: ORAL_TABLET | ORAL | Status: DC

## 2012-12-02 MED ORDER — IBUPROFEN 600 MG PO TABS: 600.0000 mg | ORAL_TABLET | Freq: Three times a day (TID) | ORAL | Status: DC | PRN

## 2012-12-02 MED ORDER — FERROUS SULFATE 325 (65 FE) MG PO TABS: 325.0000 mg | ORAL_TABLET | Freq: Three times a day (TID) | ORAL | Status: DC

## 2012-12-02 NOTE — Assessment & Plan Note (Addendum)
A. HA and light sensitivity with normal ocular exam and visual acuity. Suspect migraine.  P: Restart elavil 50 mg nightly for prevention.

## 2012-12-02 NOTE — Assessment & Plan Note (Addendum)
1. Provera on  The 21st day of your cycle (start today) repeat up to three cycles if still experiencing heavy or irregular bleeding.  2. Ibuprofen 600 mg 3 times daily starting the day before period. 3. Stop birth control pill.

## 2012-12-02 NOTE — Assessment & Plan Note (Signed)
A: mood disorder anxiety predominant with some depressive symptoms.  P: Elavil 50 mg nightly (pateint tolerated 25 mg well).  Advised patient seek counseling services on campus as they are often free or low cost.

## 2012-12-02 NOTE — Progress Notes (Signed)
Subjective:     Patient ID: Shelby Graves, female   DOB: 04-25-1991, 21 y.o.   MRN: [id number]  HPI 21 yo F presents for f/u visit to discuss the following:  1. Menometrorrhagia: was taking OCP for the last 3 months. Lightened periods. Still having frequent periods. LMP 11/11/12 x 9 days. Prior to that 11/02/12 x 7 days. Has cramping with periods.   2. Headaches: intermittent. Bilateral frontal and temple. Associated with sensitivity to light and blurred vision in R eye. Denies double vision. Denies Nausea, fever, vomiting. She denies personal or family history of vision loss or impairment. She is not taking NSAID or elavil. She admits to poor sleep due to staying up late studying and anxiety.   3. Anxiety: x 4 months. Associated with feeling overwhelmed, crying and shortness of breath. Occurs once weekly. Last episode yesterday associated with misplaced rent money order and $25 fee. She is going to school full time and working part time. She is not in a romantic relationship. She denies personal or family history of anxiety or depression.   Review of Systems As per HPI     Objective:   Physical Exam BP 112/76  Pulse 73  Temp 98.6 F (37 C) (Oral)  Ht 5\' 5"  (1.651 m)  Wt 153 lb 4.8 oz (69.536 kg)  BMI 25.51 kg/m2 General appearance: alert, cooperative and no distress Eyes: conjunctivae/corneas clear. PERRL, EOM's intact. Fundi benign. GAD-7: score of 16. 1-5. 2-1,4, 7. 3-2,3,6. Extremely difficult to function. Moderate-severe anxiety.  PHQ-9: score of 13. 1-6,7,8. 2-3,4. 3-1,2,5. Very difficult to function. Moderate depressive symptoms. Minor depression.      Assessment and Plan:

## 2012-12-02 NOTE — Patient Instructions (Addendum)
Shelby Graves,  Thank you for coming in today.   For bleeding: 1. Provera on  The 21st day of your cycle (start today) repeat up to three cycles if still experiencing heavy or irregular bleeding.  2. Ibuprofen 600 mg 3 times daily starting the day before period. 3. Stop birth control pill.  For anxiety with poor sleep: 1. Increase elavil to 50 mg nightly 2. Talk with your parents about your concerns and needs.  3. Look in to counseling services at student health.   F/u in 6 weeks.   Dr. Armen Pickup

## 2012-12-03 ENCOUNTER — Encounter: Payer: Self-pay | Admitting: *Deleted

## 2012-12-03 ENCOUNTER — Telehealth: Payer: Self-pay | Admitting: *Deleted

## 2012-12-03 NOTE — Telephone Encounter (Signed)
LMOVM for pt to return call.  Please just let her know that her labs were normal.  Will also mail reminder incase pt does not get the meesage. Finas Delone, Maryjo Rochester

## 2012-12-03 NOTE — Telephone Encounter (Signed)
Message copied by Osborne Oman on Thu Dec 03, 2012  1:50 PM ------      Message from: Dessa Phi      Created: Thu Dec 03, 2012 12:22 PM       Normal labs: hgb and prolactin level. Please inform patient.

## 2013-01-04 ENCOUNTER — Telehealth: Payer: Self-pay | Admitting: Family Medicine

## 2013-01-04 DIAGNOSIS — Z0000 Encounter for general adult medical examination without abnormal findings: Secondary | ICD-10-CM

## 2013-01-04 NOTE — Telephone Encounter (Signed)
Dental referral placed for health care maintenance. If a more specific diagnosis is required the patient will have to schedule an appt.

## 2013-01-04 NOTE — Telephone Encounter (Signed)
Patient is calling requesting a Dental referral.  The need it faxed over to [facility].  The fax # is [phone].

## 2013-01-15 ENCOUNTER — Encounter (HOSPITAL_COMMUNITY): Payer: Self-pay | Admitting: Emergency Medicine

## 2013-01-15 ENCOUNTER — Emergency Department (HOSPITAL_COMMUNITY)
Admission: EM | Admit: 2013-01-15 | Discharge: 2013-01-15 | Disposition: A | Discharge status: Home / Self Care | Payer: No Typology Code available for payment source | Attending: Emergency Medicine | Admitting: Emergency Medicine

## 2013-01-15 DIAGNOSIS — Z8679 Personal history of other diseases of the circulatory system: Secondary | ICD-10-CM | POA: Insufficient documentation

## 2013-01-15 DIAGNOSIS — Z862 Personal history of diseases of the blood and blood-forming organs and certain disorders involving the immune mechanism: Secondary | ICD-10-CM | POA: Insufficient documentation

## 2013-01-15 DIAGNOSIS — J45909 Unspecified asthma, uncomplicated: Secondary | ICD-10-CM | POA: Insufficient documentation

## 2013-01-15 DIAGNOSIS — Z8659 Personal history of other mental and behavioral disorders: Secondary | ICD-10-CM | POA: Insufficient documentation

## 2013-01-15 DIAGNOSIS — R109 Unspecified abdominal pain: Secondary | ICD-10-CM | POA: Insufficient documentation

## 2013-01-15 DIAGNOSIS — Z8619 Personal history of other infectious and parasitic diseases: Secondary | ICD-10-CM | POA: Insufficient documentation

## 2013-01-15 DIAGNOSIS — M545 Low back pain, unspecified: Secondary | ICD-10-CM | POA: Insufficient documentation

## 2013-01-15 DIAGNOSIS — F17200 Nicotine dependence, unspecified, uncomplicated: Secondary | ICD-10-CM | POA: Insufficient documentation

## 2013-01-15 DIAGNOSIS — N739 Female pelvic inflammatory disease, unspecified: Secondary | ICD-10-CM | POA: Insufficient documentation

## 2013-01-15 DIAGNOSIS — Z8709 Personal history of other diseases of the respiratory system: Secondary | ICD-10-CM | POA: Insufficient documentation

## 2013-01-15 DIAGNOSIS — Z8742 Personal history of other diseases of the female genital tract: Secondary | ICD-10-CM | POA: Insufficient documentation

## 2013-01-15 DIAGNOSIS — Z3202 Encounter for pregnancy test, result negative: Secondary | ICD-10-CM | POA: Insufficient documentation

## 2013-01-15 HISTORY — DX: Anxiety disorder, unspecified: F41.9

## 2013-01-15 LAB — URINE MICROSCOPIC-ADD ON

## 2013-01-15 LAB — URINALYSIS, ROUTINE W REFLEX MICROSCOPIC
Glucose, UA: NEGATIVE mg/dL
Nitrite: NEGATIVE
Protein, ur: NEGATIVE mg/dL
Urobilinogen, UA: 1 mg/dL (ref 0.0–1.0)

## 2013-01-15 LAB — POCT PREGNANCY, URINE: Preg Test, Ur: NEGATIVE

## 2013-01-15 LAB — WET PREP, GENITAL: Yeast Wet Prep HPF POC: NONE SEEN

## 2013-01-15 MED ORDER — METRONIDAZOLE 500 MG PO TABS
500.0000 mg | ORAL_TABLET | Freq: Once | ORAL | Status: AC
  Administered 2013-01-15: 500 mg via ORAL
  Filled 2013-01-15: qty 1

## 2013-01-15 MED ORDER — DOXYCYCLINE HYCLATE 100 MG PO TABS
100.0000 mg | ORAL_TABLET | Freq: Once | ORAL | Status: AC
  Administered 2013-01-15: 100 mg via ORAL
  Filled 2013-01-15: qty 1

## 2013-01-15 MED ORDER — OXYCODONE-ACETAMINOPHEN 5-325 MG PO TABS
1.0000 | ORAL_TABLET | Freq: Once | ORAL | Status: AC
  Administered 2013-01-15: 1 via ORAL
  Filled 2013-01-15: qty 1

## 2013-01-15 MED ORDER — OXYCODONE-ACETAMINOPHEN 5-325 MG PO TABS: 1.0000 | ORAL_TABLET | ORAL | Status: DC | PRN

## 2013-01-15 MED ORDER — METRONIDAZOLE 500 MG PO TABS: 500.0000 mg | ORAL_TABLET | Freq: Two times a day (BID) | ORAL | Status: DC

## 2013-01-15 MED ORDER — LIDOCAINE HCL (PF) 1 % IJ SOLN
INTRAMUSCULAR | Status: AC
  Administered 2013-01-15: 2 mL
  Filled 2013-01-15: qty 5

## 2013-01-15 MED ORDER — DOXYCYCLINE HYCLATE 100 MG PO CAPS: 100.0000 mg | ORAL_CAPSULE | Freq: Two times a day (BID) | ORAL | Status: DC

## 2013-01-15 MED ORDER — CEFTRIAXONE SODIUM 250 MG IJ SOLR
250.0000 mg | Freq: Once | INTRAMUSCULAR | Status: AC
  Administered 2013-01-15: 250 mg via INTRAMUSCULAR
  Filled 2013-01-15: qty 250

## 2013-01-15 NOTE — ED Notes (Signed)
Period started last Friday and stopped this am and now has a vag d/c hurts to void also  Lower abd pain

## 2013-01-15 NOTE — ED Provider Notes (Signed)
History     CSN: [id number]  Arrival date & time 01/15/13  1134   First MD Initiated Contact with Patient 01/15/13 1151       chief complaint: Vaginal pain   The history is provided by the patient.   patient reports to 3 days of worsening vaginal discharge and lower abdominal pain.  She also has low back pain.  Her symptoms are mild to moderate in severity.  She has a history of PID, gonorrhea, chlamydia there is been treated before in the past.  She denies fever but reports chills today.  Her pain is moderate in severity.  No flank pain or urinary symptoms  Past Medical History  Diagnosis Date  . Hyperventilation   . Anemia 2012  . Migraine 2010  . Ovarian cyst 2012    Diagnosed on pelvic ultrasound. F/u Ultrasound 06/2012 revealed no cyst.   . PID (pelvic inflammatory disease) 2012    GC and chlamydia   . Asthma 2009    last used inhaler 2009  . Yeast infection   . Hx of chlamydia infection   . History of gonorrhea   . H/O varicella   . Anxiety     History reviewed. No pertinent past surgical history.  Family History  Problem Relation Age of Onset  . Hyperlipidemia Mother   . Hypertension Mother   . Kidney disease Mother   . Hypertension Father   . Hypertension Brother   . Hyperlipidemia Brother   . Cancer Maternal Aunt 58    Ovarian CA  . Diabetes Maternal Grandmother   . Diabetes Maternal Grandfather     History  Substance Use Topics  . Smoking status: Current Some Day Smoker  . Smokeless tobacco: Never Used  . Alcohol Use: No    OB History    Grav Para Term Preterm Abortions TAB SAB Ect Mult Living                  Review of Systems  All other systems reviewed and are negative.    Allergies  Review of patient's allergies indicates no known allergies.  Home Medications   Current Outpatient Rx  Name  Route  Sig  Dispense  Refill  . OVER THE COUNTER MEDICATION   Oral   Take 1 tablet by mouth 2 (two) times daily. Vitex Vitamin for hormone  correction and irregular bleeding.           BP 124/73  Pulse 111  Temp 99.1 F (37.3 C) (Oral)  Resp 18  SpO2 100%  Physical Exam  Nursing note and vitals reviewed. Constitutional: She is oriented to person, place, and time. She appears well-developed and well-nourished. No distress.  HENT:  Head: Normocephalic and atraumatic.  Eyes: EOM are normal.  Neck: Normal range of motion.  Cardiovascular: Normal rate, regular rhythm and normal heart sounds.   Pulmonary/Chest: Effort normal and breath sounds normal.  Abdominal: Soft. She exhibits no distension.       Mild suprapubic abdominal tenderness  Genitourinary:       Normal external genitalia.  No adnexal masses or fullness.  Cervical motion tenderness on exam.  Cervical erythema with frank cervical discharge.  No vaginal bleeding noted.  Musculoskeletal: Normal range of motion.  Neurological: She is alert and oriented to person, place, and time.  Skin: Skin is warm and dry.  Psychiatric: She has a normal mood and affect. Judgment normal.    ED Course  Procedures (including critical care time)  Labs Reviewed  URINALYSIS, ROUTINE W REFLEX MICROSCOPIC - Abnormal; Notable for the following:    Ketones, ur >80 (*)     Leukocytes, UA SMALL (*)     All other components within normal limits  WET PREP, GENITAL - Abnormal; Notable for the following:    WBC, Wet Prep HPF POC TOO NUMEROUS TO COUNT (*)     All other components within normal limits  POCT PREGNANCY, URINE  URINE MICROSCOPIC-ADD ON  GC/CHLAMYDIA PROBE AMP   No results found.   1. Pelvic inflammatory disease (PID)       MDM  Patient will be treated for pelvic inflammatory disease.  Home with pain medicine and antibiotics.        Lyanne Co, MD 01/15/13 1355

## 2013-01-18 NOTE — ED Notes (Signed)
+  Chlamydia. Patient treated with Rocephin. [facility] faxed.

## 2013-02-15 ENCOUNTER — Encounter: Payer: Self-pay | Admitting: Family Medicine

## 2013-02-15 ENCOUNTER — Ambulatory Visit (INDEPENDENT_AMBULATORY_CARE_PROVIDER_SITE_OTHER): Payer: No Typology Code available for payment source | Admitting: Family Medicine

## 2013-02-15 VITALS — BP 102/64 | HR 82 | Temp 98.2°F | Ht 65.0 in | Wt 150.0 lb

## 2013-02-15 DIAGNOSIS — Z0000 Encounter for general adult medical examination without abnormal findings: Secondary | ICD-10-CM

## 2013-02-15 DIAGNOSIS — F329 Major depressive disorder, single episode, unspecified: Secondary | ICD-10-CM

## 2013-02-15 DIAGNOSIS — N921 Excessive and frequent menstruation with irregular cycle: Secondary | ICD-10-CM

## 2013-02-15 DIAGNOSIS — F39 Unspecified mood [affective] disorder: Secondary | ICD-10-CM

## 2013-02-15 DIAGNOSIS — N920 Excessive and frequent menstruation with regular cycle: Secondary | ICD-10-CM

## 2013-02-15 DIAGNOSIS — G43109 Migraine with aura, not intractable, without status migrainosus: Secondary | ICD-10-CM

## 2013-02-15 NOTE — Assessment & Plan Note (Signed)
See above regarding specific issues.  She had GC/Chlamydia, HIV done recently. When she gets results, she was advised to let us know so we can document results into our records.  She is not on birth control and declines at this time. She will use condoms/be abstinent.

## 2013-02-15 NOTE — Assessment & Plan Note (Signed)
She seems to be getting more headaches. She was advised to follow-up to discuss this further. OTC analgesics do not seem to provide significant relief.

## 2013-02-15 NOTE — Assessment & Plan Note (Signed)
Her stressors seem to be causing her significant distress. PHQ-9 10/27. She was advised to follow-up to discuss this.

## 2013-02-15 NOTE — Assessment & Plan Note (Signed)
Improved on Vitex. Normal period for the past month.

## 2013-02-15 NOTE — Patient Instructions (Addendum)
Please call and let us know your lab results done at [facility].   We will check your thyroid hormone today.  If your lab results are normal, I will send you a letter with the results. If abnormal, someone at the clinic will get in touch with you.   Please follow-up with Dr. Armen Pickup to talk about your migraines.   It was nice to meet you today.

## 2013-02-15 NOTE — Progress Notes (Signed)
  Subjective:    Patient ID: Shelby Graves, female    DOB: [birth year], 22 y.o.   MRN: [id number]  HPI # Preventative She is not on birth control. She does not want to be because it messes up her periods.  She will try being abstinent/using condoms.   # Menometorrhagia This resolved after starting Vitex.  Her periods have been regular for the past month. She stopped taking a few weeks ago when hospitalized for PID. She will re-start and wanted to let us know.   # Migraines, headaches She continues to get these frequently. She thinks it is worse now due to her increased stress level.  She also endorses unintentional 5 lb weight gain, not eating as well, "depression".   Review of Systems Denies fevers, chills, abdominal pain, dysuria, vaginal discharge/irritation  Allergies, medication, past medical history reviewed.  Smoking status noted.  Family history: 10/27, very difficult      Objective:   Physical Exam GEN: NAD; well-nourished, -appearing PSYCH: pleasant; appears mildly anxious CV: RRR, normal S1/S2, no murmur PULM: NI WOB; CTAB ABD: soft, NT, ND EXT: no edema NECK: no thyromegaly, palpable nodules      Assessment & Plan:

## 2013-02-16 ENCOUNTER — Encounter: Payer: Self-pay | Admitting: Family Medicine

## 2013-05-24 ENCOUNTER — Encounter: Payer: Self-pay | Admitting: Family Medicine

## 2013-05-24 ENCOUNTER — Telehealth: Payer: Self-pay | Admitting: Family Medicine

## 2013-05-24 ENCOUNTER — Ambulatory Visit (INDEPENDENT_AMBULATORY_CARE_PROVIDER_SITE_OTHER): Payer: BC Managed Care – PPO | Admitting: Family Medicine

## 2013-05-24 VITALS — BP 107/71 | HR 99 | Temp 98.6°F | Ht 65.0 in | Wt 152.0 lb

## 2013-05-24 DIAGNOSIS — D509 Iron deficiency anemia, unspecified: Secondary | ICD-10-CM

## 2013-05-24 DIAGNOSIS — R498 Other voice and resonance disorders: Secondary | ICD-10-CM

## 2013-05-24 DIAGNOSIS — R499 Unspecified voice and resonance disorder: Secondary | ICD-10-CM | POA: Insufficient documentation

## 2013-05-24 MED ORDER — FERROUS SULFATE 325 (65 FE) MG PO TABS: 325.0000 mg | ORAL_TABLET | Freq: Three times a day (TID) | ORAL | Status: DC

## 2013-05-24 MED ORDER — OMEPRAZOLE 40 MG PO CPDR: 40.0000 mg | DELAYED_RELEASE_CAPSULE | Freq: Every day | ORAL | Status: DC

## 2013-05-24 NOTE — Patient Instructions (Signed)
Galaxy,  Thank you for coming in today. Please start Prilosec take in 20 minutes daily before your meal.   If hgb is < 12 you should restart daily iron.   Let us try the Prilosec for 2-3 weeks. Then retry singing. If there is no improvement, call me and I will place ENT referral.   Dr. Armen Pickup

## 2013-05-24 NOTE — Assessment & Plan Note (Signed)
A: patient not compliant with medications. I suspect that she is still anemic. P:  POC Hgb. Restart oral iron therapy for Hgb < 12 .

## 2013-05-24 NOTE — Telephone Encounter (Signed)
Patient's hemoglobin is 10.4. Please restart iron therapy per ferrous sulfate 325 mg TID with meals.

## 2013-05-24 NOTE — Progress Notes (Signed)
Subjective:     Patient ID: Shelby Graves, female   DOB: 01-Jun-1991, 22 y.o.   MRN: [id number]  HPI 22 yo  female presents for follow visit to discuss the following:  #1 late menstrual period: Period was 2 weeks late. First day of her LMP  was 05/18/2013. She takes active with one partner and uses condoms consistently. She would not like additional birth control.  #2 fatigue: Patient presented persistent fatigue. She currently works at [employer] Friday's usually gets up at 2 AM. She states her last dose. Was heavy for the first 2 days only. She stopped taking oral iron therapy. She denies chest pain palpitations.  #3 change in her voice: Patient reports checking her voice over the past 6 months. She was previously in a choir singing alto and soprano two years ago She now feels that she cannot sing the soprano part. She's also concerned because she tends to have a chronic soreness on the left side of her throat. She denies fever. She does have a history of reflux. She states that she burps and gets heartburn. She has taken an OTC PPI in the past but not recently.   Review of Systems As per HPI     Objective:   Physical Exam BP 107/71  Pulse 99  Temp(Src) 98.6 F (37 C) (Oral)  Ht 5\' 5"  (1.651 m)  Wt 152 lb (68.947 kg)  BMI 25.29 kg/m2  LMP 05/20/2013 General appearance: alert, cooperative and no distress Ears: normal TM's and external ear canals both ears Throat: lips, mucosa, and tongue normal; teeth and gums normal, tonsillar stone on the R side Lungs: clear to auscultation bilaterally Heart: regular rate and rhythm, S1, S2 normal, no murmur, click, rub or gallop    Assessment and Plan:

## 2013-05-24 NOTE — Assessment & Plan Note (Signed)
A: change in voice either physiologically, could be strain but patient does not sign regularly. Also irritation related to reflux is likely. P: Rest voice Daily PPI per orders Advised patient to call in for persistent pain and straining after taking PPI for 2-3 weeks. If she is still symptomatic I will send her to ENT for direct laryngoscopy.

## 2013-07-21 ENCOUNTER — Encounter: Payer: Self-pay | Admitting: Sports Medicine

## 2013-07-21 ENCOUNTER — Ambulatory Visit (INDEPENDENT_AMBULATORY_CARE_PROVIDER_SITE_OTHER): Payer: BC Managed Care – PPO | Admitting: Sports Medicine

## 2013-07-21 VITALS — BP 102/69 | HR 67 | Temp 98.3°F | Wt 158.0 lb

## 2013-07-21 DIAGNOSIS — S59909A Unspecified injury of unspecified elbow, initial encounter: Secondary | ICD-10-CM

## 2013-07-21 DIAGNOSIS — S59901A Unspecified injury of right elbow, initial encounter: Secondary | ICD-10-CM | POA: Insufficient documentation

## 2013-07-21 MED ORDER — MELOXICAM 15 MG PO TABS: 15.0000 mg | ORAL_TABLET | Freq: Every day | ORAL | Status: DC

## 2013-07-21 NOTE — Progress Notes (Signed)
  [facility]  Patient name: Shelby Graves MRN [id number]  Date of birth: [birth year]  CC & HPI:  Shelby Graves is a 22 y.o. female presenting today for:  # R Elbow pain:  Context  pain and weakness at medial/posterior elbow following volleyball game  Onset/Duration:  8 days  Illicit ing Event/Injury:  Yes , overhead serving during pool volleyball  Character:  sharp and occasionally burning   Severity:  severe, job limiting  Radiation:  occasionally partially down forearm  Aggravating:  straightening or swinging arm  Associated Symptoms:  no numbness/tingling, weakness  Effective Therapies:  nothing tried  Inneffective Therapies:    RED FLAGS:  no trauma, no falls    ROS:  PER HPI  Pertinent History Reviewed:  Medical & Surgical Hx:  Reviewed:  Medications: Reviewed & Updated - see associated section Social History: Reviewed -  reports that she has quit smoking. She started smoking about 10 months ago. She has never used smokeless tobacco.  Objective Findings:  Vitals: BP 102/69  Pulse 67  Temp(Src) 98.3 F (36.8 C) (Oral)  Wt 158 lb (71.668 kg)  BMI 26.29 kg/m2  PE: GENERAL:  young at female. In no discomfort; no respiratory distress  PSYCH:  alert and appropriate, good insight      NeuroMSK  right elbow Exam: Appearance:  there is no edema , bruising or deformity   Palpation:  tender to palpation diffusely on the medial aspect of the arm   ROM: Active Passive  Full but with pain      Neurovascular:   upper extremity myotomes 5+/5  Upper street dermatomes grossly intact to light touch   MSK Testing:  there is slight laxity of the MCL on the right elbow with valgus stress but there is a solid endpoint.  She does have a positive Tinel sign radiating down to her right fourth and fifth fingers .  There's no radial head tenderness, upper extremity squeeze test is negative           Assessment & Plan:

## 2013-07-21 NOTE — Patient Instructions (Addendum)
It was nice to see you today.   Today we discussed: 1. Injury of elbow, right, initial encounter I think you have sprained a ligament in your elbow: Take daily for the next 2 weeks then as needed - meloxicam (MOBIC) 15 MG tablet; Take 1 tablet (15 mg total) by mouth daily.  Dispense: 30 tablet; Refill: 0  Be sure to rest your elbow and avoid big motions.  If you continue to have pain and would like to consider a brace please let our office know  Please plan to return to see our Sports medicine Department if you are not improved in 2 weeks.   Their number is: [phone]

## 2013-07-21 NOTE — Assessment & Plan Note (Addendum)
Laxity of MCL of R elbow with + Tinnels There was no associated trauma to will defer x-rays at this time Suspect MCL ligament injury with secondary Nerve Irritation Given anti-inflammatories, instructed to rest, ICE TID F/u in two weeks with SM if not improved for [place]

## 2013-08-24 ENCOUNTER — Ambulatory Visit (INDEPENDENT_AMBULATORY_CARE_PROVIDER_SITE_OTHER): Payer: BC Managed Care – PPO | Admitting: Family Medicine

## 2013-08-24 VITALS — BP 102/68 | Ht 65.0 in | Wt 156.0 lb

## 2013-08-24 DIAGNOSIS — G5621 Lesion of ulnar nerve, right upper limb: Secondary | ICD-10-CM

## 2013-08-24 DIAGNOSIS — G5620 Lesion of ulnar nerve, unspecified upper limb: Secondary | ICD-10-CM

## 2013-08-24 DIAGNOSIS — M7700 Medial epicondylitis, unspecified elbow: Secondary | ICD-10-CM

## 2013-08-24 DIAGNOSIS — M7701 Medial epicondylitis, right elbow: Secondary | ICD-10-CM

## 2013-08-24 MED ORDER — DICLOFENAC SODIUM 75 MG PO TBEC: 75.0000 mg | DELAYED_RELEASE_TABLET | Freq: Two times a day (BID) | ORAL | Status: DC

## 2013-08-24 NOTE — Progress Notes (Signed)
CC: Right elbow pain and numbness HPI: Patient is a pleasant 22 year old female who presents for evaluation of right elbow pain. She states that her pain initially started at the end of July when she was on vacation and was playing some recreational volleyball in the pool. She notes that she was doing a series of 3 serves in a row and felt immediate pain in the posterior medial aspect of her elbow. She had no improvement so she saw her primary doctor in early August who recommended ice and meloxicam. Patient states that her pain has improved some but she does continue to have some pain. She mostly is concerned about the numbness that she is feeling along the medial aspect of her forearm as well as into her right pinky at times. She says that the arm seems to fall asleep very easily. 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 She feels leg holding the tray when she is waiting on tables also increases her pain. When the pain happens it is sharp. She states the arm feels weak. She notes that she also has trouble with full extension of her elbow.  ROS: As above in the HPI. All other systems are stable or negative.  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 She does not smoke. She drinks alcohol occasionally. Family: Positive for diabetes, hypertension, ovarian cancer  Allergies: No known drug allergies    OBJECTIVE: APPEARANCE:  Patient in no acute distress.The patient appeared well nourished and normally developed. HEENT: No scleral icterus. Conjunctiva non-injected Resp: Non labored Skin: No rash MSK:  Right elbow:  - FROM in flexion, extension, pronation, supination - No tenderness to palpation over the lateral upper condyle, antecubital fossa, olecranon - Tenderness to palpation over the medial epicondyle as well as in the cubital tunnel - Strength is 5 out of 5 in elbow flexion,  pronation, supination, wrist flexion and extension. Wrist flexion against resistance causes pain. Normal grip strength in the fingers. - Sensation to light touch is abnormal on the medial forearm and pinky finger of the right hand  MSK [place]: Limited musculoskeletal ultrasound of the right elbow was performed in the transverse and longitudinal views. Medial elbow was visualized. There was increased hypoechoic signal in both longitudinal and transverse views at the medial condyle at the attachment of the common flexor tendon. No obvious tear. Ulnar nerve also visualized and appeared mildly thickened without hypoechoic changes or subluxation  ASSESSMENT: #1. Right medial elbow pain secondary to medial epicondylitis and ulnar nerve irritation   PLAN: Unfortunately, patient has not had a good response yet to NSAID therapy. My initial plan was to treat her with a six-day prednisone dose pack however patient declined this because she says she re: has trouble sleeping. We will therefore try diclofenac along with ice massage and home exercise plan. Patient was given exercises to do with wrist extension, flexion, pronation, and supination. She also declined a body helix compression sleeve today do to expense. We will see her back in 3 weeks to see how she is doing.

## 2013-08-24 NOTE — Patient Instructions (Addendum)
Thank you for coming in today.  No additional right arm lifting in gym class. Limit activity/lifting with right arm to the exercises on the handout that I gave you.  1. Taken diclofenac 2 x per day with food 2. Do exercises 2 x per day 3. Consider prednisone  Followup in 3 weeks.

## 2013-08-31 ENCOUNTER — Ambulatory Visit: Payer: BC Managed Care – PPO | Admitting: Family Medicine

## 2013-09-02 ENCOUNTER — Ambulatory Visit (INDEPENDENT_AMBULATORY_CARE_PROVIDER_SITE_OTHER): Payer: BC Managed Care – PPO | Admitting: Family Medicine

## 2013-09-02 ENCOUNTER — Other Ambulatory Visit (HOSPITAL_COMMUNITY)
Admission: RE | Admit: 2013-09-02 | Discharge: 2013-09-02 | Disposition: A | Discharge status: Home / Self Care | Payer: BC Managed Care – PPO | Source: Ambulatory Visit | Attending: Family Medicine | Admitting: Family Medicine

## 2013-09-02 ENCOUNTER — Encounter: Payer: Self-pay | Admitting: Family Medicine

## 2013-09-02 VITALS — BP 104/72 | HR 84 | Temp 98.0°F | Ht 65.0 in | Wt 159.0 lb

## 2013-09-02 DIAGNOSIS — Z113 Encounter for screening for infections with a predominantly sexual mode of transmission: Secondary | ICD-10-CM | POA: Insufficient documentation

## 2013-09-02 DIAGNOSIS — N760 Acute vaginitis: Secondary | ICD-10-CM

## 2013-09-02 DIAGNOSIS — N898 Other specified noninflammatory disorders of vagina: Secondary | ICD-10-CM

## 2013-09-02 LAB — POCT WET PREP (WET MOUNT): Clue Cells Wet Prep Whiff POC: POSITIVE

## 2013-09-02 MED ORDER — METRONIDAZOLE 500 MG PO TABS: 500.0000 mg | ORAL_TABLET | Freq: Two times a day (BID) | ORAL | Status: DC

## 2013-09-02 NOTE — Progress Notes (Signed)
Patient ID: Shelby Graves, female   DOB: 1991-05-15, 22 y.o.   MRN: [id number]  [facility] Fonda Rochon M. Rhilynn Preyer, MD Phone: [phone]   Subjective: HPI: Patient is a 22 y.o. female presenting to clinic today for vaginal odor and STD check.  Vaginitis Patient presents for evaluation of an abnormal vaginal odor. Symptoms have been present for 1 month. Vaginal symptoms: abnormal bleeding: bleeding after period and odor. Contraception: none. She denies blisters, bumps, local irritation and vulvar itching. Sexually transmitted infection risk: possible STD exposure. Menstrual flow: Regular.  She has history of GC/Ch with PID and is currently feeling some inflammation in her pelvis.  History Reviewed: Former smoker. Health Maintenance: Needs flu shot  ROS: Please see HPI above.  Objective: Office vital signs reviewed. BP 104/72  Pulse 84  Temp(Src) 98 F (36.7 C) (Oral)  Ht 5\' 5"  (1.651 m)  Wt 159 lb (72.122 kg)  BMI 26.46 kg/m2  LMP 08/23/2013  Physical Examination:  General: Awake, alert. NAD HEENT: Atraumatic, normocephalic Neck: No masses palpated. No LAD Pulm: CTAB, no wheezes Cardio: RRR, no murmurs appreciated Abdomen:+BS, soft, nontender, nondistended GU: No external irritation. White, mucus d/c in vaginal. No CMT however cervix is inflammed. No abd tenderness Extremities: No edema Neuro: Grossly intact  Assessment: 22 y.o. female with vaginitis  Plan: See Problem List and After Visit Summary

## 2013-09-02 NOTE — Patient Instructions (Addendum)
Bacterial Vaginosis  Bacterial vaginosis is an infection of the vagina. A healthy vagina has many kinds of good germs (bacteria). Sometimes the number of good germs can change. This allows bad germs to move in and cause an infection. You may be given medicine (antibiotics) to treat the infection. Or, you may not need treatment at all.  HOME CARE   Take your medicine as told. Finish them even if you start to feel better.   Do not have sex until you finish your medicine.   Do not douche.   Practice safe sex.   Tell your sex partner that you have an infection. They should see their doctor for treatment if they have problems.  GET HELP RIGHT AWAY IF:   You do not get better after 3 days of treatment.   You have grey fluid (discharge) coming from your vagina.   You have pain.   You have a temperature of 102 F (38.9 C) or higher.  MAKE SURE YOU:    Understand these instructions.   Will watch your condition.   Will get help right away if you are not doing well or get worse.  Document Released: 09/17/2008 Document Revised: 03/02/2012 Document Reviewed: 09/17/2008  [employer] Patient Information 2014 [employer].

## 2013-09-03 DIAGNOSIS — N760 Acute vaginitis: Secondary | ICD-10-CM | POA: Insufficient documentation

## 2013-09-03 DIAGNOSIS — B9689 Other specified bacterial agents as the cause of diseases classified elsewhere: Secondary | ICD-10-CM | POA: Insufficient documentation

## 2013-09-03 NOTE — Assessment & Plan Note (Signed)
Labs and exam consistent with BV. Will treat with Flagyl PO. Patient advised not to drink alcohol while on medication. Tested for other STD and will call if any of these are positive. Pt agrees with plan and will return if not improved.

## 2013-09-14 ENCOUNTER — Ambulatory Visit (INDEPENDENT_AMBULATORY_CARE_PROVIDER_SITE_OTHER): Payer: BC Managed Care – PPO | Admitting: Family Medicine

## 2013-09-14 VITALS — BP 112/79 | Ht 65.0 in | Wt 160.0 lb

## 2013-09-14 DIAGNOSIS — M25529 Pain in unspecified elbow: Secondary | ICD-10-CM

## 2013-09-14 DIAGNOSIS — M25521 Pain in right elbow: Secondary | ICD-10-CM

## 2013-09-14 DIAGNOSIS — S59901A Unspecified injury of right elbow, initial encounter: Secondary | ICD-10-CM

## 2013-09-14 DIAGNOSIS — S59909A Unspecified injury of unspecified elbow, initial encounter: Secondary | ICD-10-CM

## 2013-09-14 MED ORDER — MELOXICAM 15 MG PO TABS: 15.0000 mg | ORAL_TABLET | Freq: Every day | ORAL | Status: DC

## 2013-09-14 NOTE — Patient Instructions (Signed)
Thank you for coming in today  Try ACE wrap on elbow Continue ice Continue meloxicam  Call if you decide that you would like referral to surgeon

## 2013-09-14 NOTE — Progress Notes (Signed)
CC: Followup right elbow pain HPI: Patient is a 22 year old female who I saw several weeks ago for right elbow pain and ulnar hand numbness following a injury while playing volleyball several weeks prior. She had evidence of ulnar nerve irritation as well as medial epicondylitis on exam and ultrasound. She was given a home exercise program as well as meloxicam. She declined prednisone at that time. She returns for followup today. She states that overall she thinks her symptoms have improved some. She does continue to get intermittent pain and numbness. She notes that she continues to get the numbness in her fifth finger. She gets pain that radiates sometimes from her elbow down her forearm. She has had a couple times where she felt a pop in her elbow that was associated with pain and numbness.  ROS: As above in the HPI. All other systems are stable or negative.  OBJECTIVE: APPEARANCE:  Patient in no acute distress.The patient appeared well nourished and normally developed. HEENT: No scleral icterus. Conjunctiva non-injected Resp: Non labored Skin: No rash MSK:  Elbow exam: Full range of motion without pain in flexion, extension, supination, pronation Mild tenderness to palpation over the medial upper condyle Palpation over the cubital tunnel and ulnar nerve recreates the patient's symptoms with pain and numbness Strength is 5 out of 5 in grip, interosseous , pinky finger flexion and extension No atrophy of hypothenar eminence  ASSESSMENT: #1. Right elbow pain most consistent with ulnar nerve irritation and possible ulnar nerve subluxation   PLAN: I did discuss with the patient that if her symptoms continue to be very bothersome with her, I recommend consultation with an upper extremity surgeon given my concern for ulnar nerve subluxation. She declined this at this time I would prefer to wait since her symptoms do continue to be improving slowly. I did also recommended elbow compression  sleeve however do to cost limitation she declined this today. We did discuss that using an over-the-counter Ace wrap may provide some compression and she can try this. Recommended that she continue her meloxicam which was refilled today as well as continue to ice the elbow. We did review the home exercises for medial epicondylitis. She will followup as needed if her symptoms continue or worsen or call if she decides she would like to see the surgeon.

## 2014-04-12 ENCOUNTER — Encounter: Payer: BC Managed Care – PPO | Admitting: Family Medicine

## 2014-04-26 ENCOUNTER — Other Ambulatory Visit (HOSPITAL_COMMUNITY)
Admission: RE | Admit: 2014-04-26 | Discharge: 2014-04-26 | Disposition: A | Discharge status: Home / Self Care | Payer: BC Managed Care – PPO | Source: Ambulatory Visit | Attending: Family Medicine | Admitting: Family Medicine

## 2014-04-26 ENCOUNTER — Encounter: Payer: Self-pay | Admitting: Family Medicine

## 2014-04-26 ENCOUNTER — Ambulatory Visit (INDEPENDENT_AMBULATORY_CARE_PROVIDER_SITE_OTHER): Payer: BC Managed Care – PPO | Admitting: Family Medicine

## 2014-04-26 VITALS — BP 104/73 | HR 80 | Temp 98.5°F | Wt 170.0 lb

## 2014-04-26 DIAGNOSIS — Z124 Encounter for screening for malignant neoplasm of cervix: Secondary | ICD-10-CM

## 2014-04-26 DIAGNOSIS — D649 Anemia, unspecified: Secondary | ICD-10-CM | POA: Insufficient documentation

## 2014-04-26 DIAGNOSIS — Z01419 Encounter for gynecological examination (general) (routine) without abnormal findings: Secondary | ICD-10-CM | POA: Insufficient documentation

## 2014-04-26 DIAGNOSIS — Z202 Contact with and (suspected) exposure to infections with a predominantly sexual mode of transmission: Secondary | ICD-10-CM

## 2014-04-26 DIAGNOSIS — Z20828 Contact with and (suspected) exposure to other viral communicable diseases: Secondary | ICD-10-CM

## 2014-04-26 DIAGNOSIS — Z113 Encounter for screening for infections with a predominantly sexual mode of transmission: Secondary | ICD-10-CM | POA: Insufficient documentation

## 2014-04-26 DIAGNOSIS — D509 Iron deficiency anemia, unspecified: Secondary | ICD-10-CM

## 2014-04-26 DIAGNOSIS — N898 Other specified noninflammatory disorders of vagina: Secondary | ICD-10-CM

## 2014-04-26 LAB — POCT WET PREP (WET MOUNT): CLUE CELLS WET PREP WHIFF POC: NEGATIVE

## 2014-04-26 LAB — CBC
HEMATOCRIT: 32 % — AB (ref 36.0–46.0)
HEMOGLOBIN: 10.8 g/dL — AB (ref 12.0–15.0)
MCH: 24.8 pg — AB (ref 26.0–34.0)
MCHC: 33.8 g/dL (ref 30.0–36.0)
MCV: 73.6 fL — ABNORMAL LOW (ref 78.0–100.0)
Platelets: 351 10*3/uL (ref 150–400)
RBC: 4.35 MIL/uL (ref 3.87–5.11)
RDW: 15.3 % (ref 11.5–15.5)
WBC: 7.5 10*3/uL (ref 4.0–10.5)

## 2014-04-26 NOTE — Progress Notes (Signed)
Shelby Graves is a 23 y.o. female who [facility] for [facility].  Pt just turned 23. Sexually active w/ men. Condoms exclusively. Periods are regular. LMP 04/15/14. Unusual odor but no irritation.   Anemia: not taking iron supplement. Still w/ regular fatigue.   The following portions of the patient's history were reviewed and updated as appropriate: allergies, current medications, past medical history, family and social history, and problem list.  Patient is a nonsmoker.   Past Medical History  Diagnosis Date  . Hyperventilation   . Anemia 2012  . Migraine 2010  . Ovarian cyst 2012    Diagnosed on pelvic ultrasound. F/u Ultrasound 06/2012 revealed no cyst.   . PID (pelvic inflammatory disease) 2012    GC and chlamydia   . Asthma 2009    last used inhaler 2009  . Yeast infection   . Hx of chlamydia infection   . History of gonorrhea   . H/O varicella   . Anxiety     ROS as above otherwise neg.    Medications reviewed. Current Outpatient Prescriptions  Medication Sig Dispense Refill  . ferrous sulfate 325 (65 FE) MG tablet Take 1 tablet (325 mg total) by mouth 3 (three) times daily with meals.  90 tablet  2  . meloxicam (MOBIC) 15 MG tablet Take 1 tablet (15 mg total) by mouth daily.  30 tablet  1  . metroNIDAZOLE (FLAGYL) 500 MG tablet Take 1 tablet (500 mg total) by mouth 2 (two) times daily.  14 tablet  0  . omeprazole (PRILOSEC) 40 MG capsule Take 1 capsule (40 mg total) by mouth daily. Take 20 minutes before meals.  30 capsule  3   No current facility-administered medications for this visit.    Exam:  BP 104/73  Pulse 80  Temp(Src) 98.5 F (36.9 C) (Oral)  Wt 170 lb (77.111 kg)  LMP 04/20/2014 Gen: Well NAD HEENT: EOMI,  MMM GU: vagina nml. Walls well rugated, cervix closed and nml.   No results found for this or any previous visit (from the past 72 hour(s)).  A/P (as seen in Problem list)  No problem-specific assessment & plan notes found for this  encounter.  Wet prep, GC, CHL HIV, RPR sent  CBC for anemaia. Counseled to start iron

## 2014-04-26 NOTE — Patient Instructions (Signed)
You are doing  We will get blood work to evaluate your anemia We will call you if there are any signs of infection

## 2014-04-27 LAB — RPR

## 2014-04-27 LAB — HIV ANTIBODY (ROUTINE TESTING W REFLEX): HIV: NONREACTIVE

## 2014-04-27 MED ORDER — FERROUS SULFATE 325 (65 FE) MG PO TABS: 325.0000 mg | ORAL_TABLET | Freq: Two times a day (BID) | ORAL | Status: DC

## 2014-04-27 NOTE — Addendum Note (Signed)
Addended by: Ozella RocksMERRELL, Phuc Kluttz J on: 04/27/2014 07:06 AM   Modules accepted: Orders

## 2014-04-28 LAB — CERVICOVAGINAL ANCILLARY ONLY
Chlamydia: NEGATIVE
Neisseria Gonorrhea: NEGATIVE

## 2014-05-20 ENCOUNTER — Ambulatory Visit: Payer: BC Managed Care – PPO

## 2014-05-20 ENCOUNTER — Ambulatory Visit (INDEPENDENT_AMBULATORY_CARE_PROVIDER_SITE_OTHER): Payer: BC Managed Care – PPO | Admitting: Internal Medicine

## 2014-05-20 VITALS — BP 98/82 | HR 85 | Temp 98.5°F | Resp 16 | Ht 64.75 in | Wt 172.4 lb

## 2014-05-20 DIAGNOSIS — S46911A Strain of unspecified muscle, fascia and tendon at shoulder and upper arm level, right arm, initial encounter: Secondary | ICD-10-CM

## 2014-05-20 DIAGNOSIS — M25519 Pain in unspecified shoulder: Secondary | ICD-10-CM

## 2014-05-20 DIAGNOSIS — S66911A Strain of unspecified muscle, fascia and tendon at wrist and hand level, right hand, initial encounter: Secondary | ICD-10-CM

## 2014-05-20 DIAGNOSIS — M25511 Pain in right shoulder: Secondary | ICD-10-CM

## 2014-05-20 DIAGNOSIS — M25531 Pain in right wrist: Secondary | ICD-10-CM

## 2014-05-20 DIAGNOSIS — IMO0002 Reserved for concepts with insufficient information to code with codable children: Secondary | ICD-10-CM

## 2014-05-20 DIAGNOSIS — S63509A Unspecified sprain of unspecified wrist, initial encounter: Secondary | ICD-10-CM

## 2014-05-20 MED ORDER — HYDROCODONE-ACETAMINOPHEN 5-325 MG PO TABS: 1.0000 | ORAL_TABLET | Freq: Four times a day (QID) | ORAL | Status: DC | PRN
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
## 2014-05-20 NOTE — Patient Instructions (Addendum)
Shoulder Dislocation Your shoulder is made up of three bones: the collar bone (clavicle); the shoulder blade (scapula), which includes the socket (glenoid cavity); and the upper arm bone (humerus). Your shoulder joint is the place where these bones meet. Strong, fibrous tissues hold these bones together (ligaments). Muscles and strong, fibrous tissues that connect the muscles to these bones (tendons) allow your arm to move through this joint. The range of motion of your shoulder joint is more extensive than most of your other joints, and the glenoid cavity is very shallow. That is the reason that your shoulder joint is one of the most unstable joints in your body. It is far more prone to dislocation than your other joints. Shoulder dislocation is when your humerus is forced out of your shoulder joint. CAUSES Shoulder dislocation is caused by a forceful impact on your shoulder. This impact usually is from an injury, such as a sports injury or a fall. SYMPTOMS Symptoms of shoulder dislocation include:  Deformity of your shoulder.  Intense pain.  Inability to move your shoulder joint.  Numbness, weakness, or tingling around your shoulder joint (your neck or down your arm).  Bruising or swelling around your shoulder. DIAGNOSIS In order to diagnose a dislocated shoulder, your caregiver will perform a physical exam. Your caregiver also may have an X-ray exam done to see if you have any broken bones. Magnetic resonance imaging (MRI) is a procedure that sometimes is done to help your caregiver see any damage to the soft tissues around your shoulder, particularly your rotator cuff tendons. Additionally, your caregiver also may have electromyography done to measure the electrical discharges produced in your muscles if you have signs or symptoms of nerve damage. TREATMENT A shoulder dislocation is treated by placing the humerus back in the joint (reduction). Your caregiver does this either manually (closed  reduction), by moving your humerus back into the joint through manipulation, or through surgery (open reduction). When your humerus is back in place, severe pain should improve almost immediately. You also may need to have surgery if you have a weak shoulder joint or ligaments, and you have recurring shoulder dislocations, despite rehabilitation. In rare cases, surgery is necessary if your nerves or blood vessels are damaged during the dislocation. After your reduction, your arm will be placed in a shoulder immobilizer or sling to keep it from moving. Your caregiver will have you wear your shoulder immoblizer or sling for 3 days to 3 weeks, depending on how serious your dislocation is. When your shoulder immobilizer or sling is removed, your caregiver may prescribe physical therapy to help improve the range of motion in your shoulder joint. HOME CARE INSTRUCTIONS  The following measures can help to reduce pain and speed up the healing process:  Rest your injured joint. Do not move it. Avoid activities similar to the one that caused your injury.  Apply ice to your injured joint for the first day or two after your reduction or as directed by your caregiver. Applying ice helps to reduce inflammation and pain.  Put ice in a plastic bag.  Place a towel between your skin and the bag.  Leave the ice on for 15-20 minutes at a time, every 2 hours while you are awake.  Exercise your hand by squeezing a soft ball. This helps to eliminate stiffness and swelling in your hand and wrist.  Take over-the-counter or prescription medicine for pain or discomfort as told by your caregiver. SEEK IMMEDIATE MEDICAL CARE IF:   Your  shoulder immobilizer or sling becomes damaged.  Your pain becomes worse rather than better.  You lose feeling in your arm or hand, or they become white and cold. MAKE SURE YOU:   Understand these instructions.  Will watch your condition.  Will get help right away if you are not  doing well or get worse. Document Released: 09/03/2001 Document Revised: 03/02/2012 Document Reviewed: 09/29/2011 [facility] Patient Information 2014 [place], [place]. RICE: Routine Care for Injuries The routine care of many injuries includes Rest, Ice, Compression, and Elevation (RICE). HOME CARE INSTRUCTIONS  Rest is needed to allow your body to heal. Routine activities can usually be resumed when comfortable. Injured tendons and bones can take up to 6 weeks to heal. Tendons are the cord-like structures that attach muscle to bone.  Ice following an injury helps keep the swelling down and reduces pain.  Put ice in a plastic bag.  Place a towel between your skin and the bag.  Leave the ice on for 15-20 minutes, 03-04 times a day. Do this while awake, for the first 24 to 48 hours. After that, continue as directed by your caregiver.  Compression helps keep swelling down. It also gives support and helps with discomfort. If an elastic bandage has been applied, it should be removed and reapplied every 3 to 4 hours. It should not be applied tightly, but firmly enough to keep swelling down. Watch fingers or toes for swelling, bluish discoloration, coldness, numbness, or excessive pain. If any of these problems occur, remove the bandage and reapply loosely. Contact your caregiver if these problems continue.  Elevation helps reduce swelling and decreases pain. With extremities, such as the arms, hands, legs, and feet, the injured area should be placed near or above the level of the heart, if possible. SEEK IMMEDIATE MEDICAL CARE IF:  You have persistent pain and swelling.  You develop redness, numbness, or unexpected weakness.  Your symptoms are getting worse rather than improving after several days. These symptoms may indicate that further evaluation or further X-rays are needed. Sometimes, X-rays may not show a small broken bone (fracture) until 1 week or 10 days later. Make a follow-up appointment  with your caregiver. Ask when your X-ray results will be ready. Make sure you get your X-ray results. Document Released: 03/23/2001 Document Revised: 03/02/2012 Document Reviewed: 05/10/2011 [facility] Patient Information 2014 [place], [place].

## 2014-05-20 NOTE — Progress Notes (Signed)
   Subjective:    Patient ID: Shelby Graves, female    DOB: 03/09/1991, 23 y.o.   MRN: [id number]  HPI    Review of Systems     Objective:   Physical Exam        Assessment & Plan:

## 2014-10-11 ENCOUNTER — Encounter (HOSPITAL_COMMUNITY): Payer: Self-pay | Admitting: Emergency Medicine

## 2014-10-11 ENCOUNTER — Emergency Department (HOSPITAL_COMMUNITY)
Admission: EM | Admit: 2014-10-11 | Discharge: 2014-10-11 | Disposition: A | Discharge status: Home / Self Care | Payer: BC Managed Care – PPO | Source: Home / Self Care

## 2014-10-11 DIAGNOSIS — J069 Acute upper respiratory infection, unspecified: Secondary | ICD-10-CM

## 2014-10-11 LAB — POCT RAPID STREP A: STREPTOCOCCUS, GROUP A SCREEN (DIRECT): NEGATIVE

## 2014-10-11 NOTE — Discharge Instructions (Signed)
Upper Respiratory Infection, Adult Cepacol Lozenges Motrin 600 mg every 6 hours  Lots of fluids Nyquil or Alka Seltzer Cold Plus  An upper respiratory infection (URI) is also sometimes known as the common cold. The upper respiratory tract includes the nose, sinuses, throat, trachea, and bronchi. Bronchi are the airways leading to the lungs. Most people improve within 1 week, but symptoms can last up to 2 weeks. A residual cough may last even longer.  CAUSES Many different viruses can infect the tissues lining the upper respiratory tract. The tissues become irritated and inflamed and often become very moist. Mucus production is also common. A cold is contagious. You can easily spread the virus to others by oral contact. This includes kissing, sharing a glass, coughing, or sneezing. Touching your mouth or nose and then touching a surface, which is then touched by another person, can also spread the virus. SYMPTOMS  Symptoms typically develop 1 to 3 days after you come in contact with a cold virus. Symptoms vary from person to person. They may include:  Runny nose.  Sneezing.  Nasal congestion.  Sinus irritation.  Sore throat.  Loss of voice (laryngitis).  Cough.  Fatigue.  Muscle aches.  Loss of appetite.  Headache.  Low-grade fever. DIAGNOSIS  You might diagnose your own cold based on familiar symptoms, since most people get a cold 2 to 3 times a year. Your caregiver can confirm this based on your exam. Most importantly, your caregiver can check that your symptoms are not due to another disease such as strep throat, sinusitis, pneumonia, asthma, or epiglottitis. Blood tests, throat tests, and X-rays are not necessary to diagnose a common cold, but they may sometimes be helpful in excluding other more serious diseases. Your caregiver will decide if any further tests are required. RISKS AND COMPLICATIONS  You may be at risk for a more severe case of the common cold if you smoke  cigarettes, have chronic heart disease (such as heart failure) or lung disease (such as asthma), or if you have a weakened immune system. The very young and very old are also at risk for more serious infections. Bacterial sinusitis, middle ear infections, and bacterial pneumonia can complicate the common cold. The common cold can worsen asthma and chronic obstructive pulmonary disease (COPD). Sometimes, these complications can require emergency medical care and may be life-threatening. PREVENTION  The best way to protect against getting a cold is to practice good hygiene. Avoid oral or hand contact with people with cold symptoms. Wash your hands often if contact occurs. There is no clear evidence that vitamin C, vitamin E, echinacea, or exercise reduces the chance of developing a cold. However, it is always recommended to get plenty of rest and practice good nutrition. TREATMENT  Treatment is directed at relieving symptoms. There is no cure. Antibiotics are not effective, because the infection is caused by a virus, not by bacteria. Treatment may include:  Increased fluid intake. Sports drinks offer valuable electrolytes, sugars, and fluids.  Breathing heated mist or steam (vaporizer or shower).  Eating chicken soup or other clear broths, and maintaining good nutrition.  Getting plenty of rest.  Using gargles or lozenges for comfort.  Controlling fevers with ibuprofen or acetaminophen as directed by your caregiver.  Increasing usage of your inhaler if you have asthma. Zinc gel and zinc lozenges, taken in the first 24 hours of the common cold, can shorten the duration and lessen the severity of symptoms. Pain medicines may help with fever, muscle  aches, and throat pain. A variety of non-prescription medicines are available to treat congestion and runny nose. Your caregiver can make recommendations and may suggest nasal or lung inhalers for other symptoms.  HOME CARE INSTRUCTIONS   Only take  over-the-counter or prescription medicines for pain, discomfort, or fever as directed by your caregiver.  Use a warm mist humidifier or inhale steam from a shower to increase air moisture. This may keep secretions moist and make it easier to breathe.  Drink enough water and fluids to keep your urine clear or pale yellow.  Rest as needed.  Return to work when your temperature has returned to normal or as your caregiver advises. You may need to stay home longer to avoid infecting others. You can also use a face mask and careful hand washing to prevent spread of the virus. SEEK MEDICAL CARE IF:   After the first few days, you feel you are getting worse rather than better.  You need your caregiver's advice about medicines to control symptoms.  You develop chills, worsening shortness of breath, or brown or red sputum. These may be signs of pneumonia.  You develop yellow or brown nasal discharge or pain in the face, especially when you bend forward. These may be signs of sinusitis.  You develop a fever, swollen neck glands, pain with swallowing, or white areas in the back of your throat. These may be signs of strep throat. SEEK IMMEDIATE MEDICAL CARE IF:   You have a fever.  You develop severe or persistent headache, ear pain, sinus pain, or chest pain.  You develop wheezing, a prolonged cough, cough up blood, or have a change in your usual mucus (if you have chronic lung disease).  You develop sore muscles or a stiff neck. Document Released: 06/04/2001 Document Revised: 03/02/2012 Document Reviewed: 03/16/2014 [facility] Patient Information 2015 [place], [place]. This information is not intended to replace advice given to you by your health care provider. Make sure you discuss any questions you have with your health care provider.

## 2014-10-11 NOTE — ED Notes (Signed)
C/o  Feeling bad last weekend and gradually getting worse.  C/o  Fatigue. Sore throat. Pain with swallowing.  Body aches.  Headache.  Postnasal drip and congestion.  Denies fever, n/v/d.  No relief with no otc meds.

## 2014-10-11 NOTE — ED Provider Notes (Signed)
CSN: [id number]     Arrival date & time 10/11/14  1311 History   First MD Initiated Contact with Patient 10/11/14 1322     Chief Complaint  Patient presents with  . URI   (Consider location/radiation/quality/duration/timing/severity/associated sxs/prior Treatment) HPI Comments: C/O upper resp sx's, sore throat, nasal congestion, fatigue and malaise, headache. For 2 d.   Past Medical History  Diagnosis Date  . Hyperventilation   . Anemia 2012  . Migraine 2010  . Ovarian cyst 2012    Diagnosed on pelvic ultrasound. F/u Ultrasound 06/2012 revealed no cyst.   . PID (pelvic inflammatory disease) 2012    GC and chlamydia   . Asthma 2009    last used inhaler 2009  . Yeast infection   . Hx of chlamydia infection   . History of gonorrhea   . H/O varicella   . Anxiety   . Allergy   . Arthritis   . Depression    History reviewed. No pertinent past surgical history. Family History  Problem Relation Age of Onset  . Hyperlipidemia Mother   . Hypertension Mother   . Kidney disease Mother   . Hypertension Father   . Hypertension Brother   . Hyperlipidemia Brother   . Cancer Maternal Aunt 58    Ovarian CA  . Diabetes Maternal Grandmother   . Diabetes Maternal Grandfather    History  Substance Use Topics  . Smoking status: Former Smoker    Start date: 09/15/2012  . Smokeless tobacco: Never Used  . Alcohol Use: No   OB History   Grav Para Term Preterm Abortions TAB SAB Ect Mult Living                 Review of Systems  Constitutional: Positive for activity change and fatigue. Negative for fever, chills and appetite change.  HENT: Positive for congestion, postnasal drip, rhinorrhea, sore throat and voice change. Negative for ear pain and facial swelling.   Eyes: Negative.   Respiratory: Positive for cough. Negative for shortness of breath and wheezing.   Cardiovascular: Negative.   Gastrointestinal: Negative.   Genitourinary: Negative.   Musculoskeletal: Negative for neck  pain and neck stiffness.  Skin: Negative for pallor and rash.  Neurological: Negative.     Allergies  Review of patient's allergies indicates no known allergies.  Home Medications   Prior to Admission medications   Medication Sig Start Date End Date Taking? Authorizing Provider  HYDROcodone-acetaminophen (NORCO/VICODIN) 5-325 MG per tablet Take 1 tablet by mouth every 6 (six) hours as needed. 05/20/14   Jonita Albeehris W Guest, MD  omeprazole (PRILOSEC) 40 MG capsule Take 1 capsule (40 mg total) by mouth daily. Take 20 minutes before meals. 05/24/13   Josalyn C Funches, MD   BP 122/81  Pulse 84  Temp(Src) 98.3 F (36.8 C) (Oral)  Resp 18  SpO2 97%  LMP 09/26/2014 Physical Exam  Nursing note and vitals reviewed. Constitutional: She is oriented to person, place, and time. She appears well-developed and well-nourished. No distress.  HENT:  Mouth/Throat: No oropharyngeal exudate.  Bilat TM's nl OP with minor erythema and clear PND Turbinates red and boggy  Eyes: Conjunctivae and EOM are normal.  Neck: Normal range of motion. Neck supple.  Cardiovascular: Normal rate, regular rhythm and normal heart sounds.   Pulmonary/Chest: Effort normal and breath sounds normal. No respiratory distress. She has no wheezes. She has no rales.  Musculoskeletal: Normal range of motion. She exhibits no edema.  Lymphadenopathy:    She  has no cervical adenopathy.  Neurological: She is alert and oriented to person, place, and time.  Skin: Skin is warm and dry. No rash noted.  Psychiatric: She has a normal mood and affect.    ED Course  Procedures (including critical care time) Labs Review Labs Reviewed  POCT RAPID STREP A ([facility] CARE ONLY)   Results for orders placed during the hospital encounter of 10/11/14  POCT RAPID STREP A ([facility] CARE ONLY)      Result Value Ref Range   Streptococcus, Group A Screen (Direct) NEGATIVE  NEGATIVE    Imaging Review No results found.   MDM   1. URI (upper  respiratory infection)    OTC meds as directed Cepacol loz Fluids rest Motrin 600 mg      Hayden Rasmussenavid Latania Bascomb, NP 10/11/14 1407

## 2014-10-13 LAB — CULTURE, GROUP A STREP

## 2014-10-14 NOTE — ED Provider Notes (Signed)
Medical screening examination/treatment/procedure(s) were performed by resident physician or non-physician practitioner and as supervising physician I was immediately available for consultation/collaboration.   Slevin Gunby DOUGLAS MD.   Armonie Staten D Kerianne Gurr, MD 10/14/14 1004 

## 2014-10-16 ENCOUNTER — Telehealth: Payer: BC Managed Care – PPO | Admitting: Family

## 2014-10-16 DIAGNOSIS — H1030 Unspecified acute conjunctivitis, unspecified eye: Secondary | ICD-10-CM

## 2014-10-16 MED ORDER — NEOMYCIN-POLYMYXIN-DEXAMETH 3.5-10000-0.1 OP SUSP: 2.0000 [drp] | Freq: Four times a day (QID) | OPHTHALMIC | Status: DC

## 2014-10-16 NOTE — Progress Notes (Signed)
We are sorry that you are not feeling well.  Here is how we plan to help!  Based on what you have shared with me it looks like you have conjunctivitis.  Conjunctivitis is a common inflammatory or infectious condition of the eye that is often referred to as "pink eye".  In most cases it is contagious (viral or bacterial). However, not all conjunctivitis requires antibiotics (ex. Allergic).  We have made appropriate suggestions for you based upon your presentation.  I have prescribed Polytrim Ophthalmic drops 1-2 drops 4 times a day times 5 days  Pink eye can be highly contagious.  It is typically spread through direct contact with secretions, or contaminated objects or surfaces that one may have touched.  Strict handwashing is suggested with soap and water is urged.  If not available, use alcohol based had sanitizer.  Avoid unnecessary touching of the eye.  If you wear contact lenses, you will need to refrain from wearing them until you see no white discharge from the eye for at least 24 hours after being on medication.  You should see symptom improvement in 1-2 days after starting the medication regimen.  Call us if symptoms are not improved in 1-2 days.  Home Care:  Wash your hands often!  Do not wear your contacts until you complete your treatment plan.  Avoid sharing towels, bed linen, personal items with a person who has pink eye.  See attention for anyone in your home with similar symptoms.  Get Help Right Away If:  Your symptoms do not improve.  You develop blurred or loss of vision.  Your symptoms worsen (increased discharge, pain or redness)  Your e-visit answers were reviewed by a board certified advanced clinical practitioner to complete your personal care plan.  Depending on the condition, your plan could have included both over the counter or prescription medications.  Please review your pharmacy choice.  If there is a problem, you may call our nursing hot line at [phone]  and have the prescription routed to another pharmacy.  Your safety is important to us.  If you have drug allergies check your prescription carefully.    You can use MyChart to ask questions about today's visit, request a non-urgent call back, or ask for a work or school excuse.  You will get an e-mail in the next two days asking about your experience.  I hope that your e-visit has been valuable and will speed your recovery. Thank you for using e-visits.      

## 2014-10-26 ENCOUNTER — Telehealth: Payer: Self-pay | Admitting: Family Medicine

## 2014-10-26 ENCOUNTER — Other Ambulatory Visit (HOSPITAL_COMMUNITY)
Admission: RE | Admit: 2014-10-26 | Discharge: 2014-10-26 | Disposition: A | Discharge status: Home / Self Care | Payer: BC Managed Care – PPO | Source: Ambulatory Visit | Attending: Family Medicine | Admitting: Family Medicine

## 2014-10-26 ENCOUNTER — Ambulatory Visit (INDEPENDENT_AMBULATORY_CARE_PROVIDER_SITE_OTHER): Payer: BC Managed Care – PPO | Admitting: Family Medicine

## 2014-10-26 ENCOUNTER — Encounter: Payer: Self-pay | Admitting: Family Medicine

## 2014-10-26 VITALS — BP 106/65 | HR 80 | Temp 98.1°F | Ht 67.45 in | Wt 173.0 lb

## 2014-10-26 DIAGNOSIS — N760 Acute vaginitis: Secondary | ICD-10-CM | POA: Diagnosis not present

## 2014-10-26 DIAGNOSIS — Z113 Encounter for screening for infections with a predominantly sexual mode of transmission: Secondary | ICD-10-CM | POA: Diagnosis not present

## 2014-10-26 DIAGNOSIS — N898 Other specified noninflammatory disorders of vagina: Secondary | ICD-10-CM

## 2014-10-26 LAB — POCT WET PREP (WET MOUNT)
CLUE CELLS WET PREP WHIFF POC: NEGATIVE
WBC, Wet Prep HPF POC: >20

## 2014-10-26 LAB — POCT URINE PREGNANCY: PREG TEST UR: NEGATIVE

## 2014-10-26 MED ORDER — FLUCONAZOLE 150 MG PO TABS: 150.0000 mg | ORAL_TABLET | Freq: Once | ORAL | Status: DC

## 2014-10-26 NOTE — Telephone Encounter (Signed)
+   for yeast infection.  Diflucan x 1 called in, pt informed and no questions.  Twana FirstBryan R. Paulina FusiHess, DO of [facility] 10/26/2014, 5:53 PM

## 2014-10-26 NOTE — Progress Notes (Signed)
  Subjective:    Shelby Graves is a 23 y.o. female who presents for sexually transmitted disease check/vaginitis. Sexual history reviewed with the patient. STI Exposure: sexual contact with individual with uncertain background 1 month ago. Previous history of STI none. Current symptoms vaginal discharge: copious, white and malodorous, vaginal irritation: mild, pelvic pain: mild. Contraception: none.  Denies substantial fever, nausea, vomiting, diarrhea, or constipation.  One sexual encounter over the past 6 months that was unprotected.  Otherwise she has not had any sexual encounters.  Menstrual History: OB History    No data available      Menarche age: [age]  Patient's last menstrual period was 09/30/2014.    The following portions of the patient's history were reviewed and updated as appropriate: allergies, current medications, past family history, past medical history, past social history, past surgical history and problem list.  Review of Systems Pertinent items are noted in HPI.    Objective:    BP 106/65 mmHg  Pulse 80  Temp(Src) 98.1 F (36.7 C) (Oral)  Ht 5' 7.45" (1.713 m)  Wt 173 lb (78.472 kg)  BMI 26.74 kg/m2  LMP 09/30/2014 General:   alert, cooperative and appears stated age  Pelvis:  Vaginal: normal mucosa without prolapse or lesions Cervix: normal appearance Clinical staff offered to be present for exam: yes  Initials: JDF  Cultures:  GC and Chlamydia genprobes, HIV antibody blood test and wet prep/RPR     Assessment:    Possible STD exposure, Vaginitis       Plan:    Discussed safe sexual practice in detail Appropriate educational material was distributed See orders for STD cultures and assays Pt will call for results

## 2014-10-26 NOTE — Patient Instructions (Signed)

## 2014-10-26 NOTE — Addendum Note (Signed)
Addended by: Henri MedalHARTSELL, JAZMIN M on: 10/26/2014 02:50 PM   Modules accepted: Orders

## 2014-10-27 ENCOUNTER — Telehealth: Payer: Self-pay | Admitting: Family Medicine

## 2014-10-27 LAB — CERVICOVAGINAL ANCILLARY ONLY
Chlamydia: NEGATIVE
Neisseria Gonorrhea: NEGATIVE

## 2014-10-27 LAB — RPR

## 2014-10-27 LAB — HIV ANTIBODY (ROUTINE TESTING W REFLEX): HIV: NONREACTIVE

## 2014-10-27 NOTE — Telephone Encounter (Signed)
Discussed negative RPR/HIV with pt, no questions.  Twana FirstBryan R. Paulina FusiHess, DO [facility] 10/27/2014, 7:33 AM

## 2014-10-30 ENCOUNTER — Encounter: Payer: Self-pay | Admitting: Family Medicine

## 2014-12-23 DIAGNOSIS — G8929 Other chronic pain: Secondary | ICD-10-CM | POA: Insufficient documentation

## 2014-12-23 DIAGNOSIS — M549 Dorsalgia, unspecified: Secondary | ICD-10-CM

## 2015-06-09 ENCOUNTER — Ambulatory Visit (INDEPENDENT_AMBULATORY_CARE_PROVIDER_SITE_OTHER): Payer: BLUE CROSS/BLUE SHIELD | Admitting: Obstetrics & Gynecology

## 2015-06-09 ENCOUNTER — Encounter: Payer: Self-pay | Admitting: Obstetrics & Gynecology

## 2015-06-09 VITALS — BP 121/79 | HR 84 | Temp 98.7°F | Ht 65.0 in | Wt 166.8 lb

## 2015-06-09 DIAGNOSIS — R102 Pelvic and perineal pain: Secondary | ICD-10-CM

## 2015-06-09 NOTE — Progress Notes (Signed)
   Subjective:    Patient ID: Shelby Graves, female    DOB: May 08, 1991, 24 y.o.   MRN: [id number]  HPI  24 yo S AAP0 here today with the complaint of LLQ pain for "a few months" "constant", She has tried Tylenol with no help. She has occasional dyspareunia. Not monogamous.  Review of Systems She had cultures and HIV testing at the health dept in March [place] pap 5/15, normal She will start [employer] in the fall and works at [employer].    Objective:   Physical Exam [id number] Breathing and ambulating normally Abd- benign       Assessment & Plan:

## 2015-06-10 LAB — GC/CHLAMYDIA PROBE AMP
CT Probe RNA: NEGATIVE
GC Probe RNA: NEGATIVE

## 2015-06-14 ENCOUNTER — Ambulatory Visit (HOSPITAL_COMMUNITY)
Admission: RE | Admit: 2015-06-14 | Discharge: 2015-06-14 | Disposition: A | Discharge status: Home / Self Care | Payer: BLUE CROSS/BLUE SHIELD | Source: Ambulatory Visit | Attending: Obstetrics & Gynecology | Admitting: Obstetrics & Gynecology

## 2015-06-14 DIAGNOSIS — N831 Corpus luteum cyst: Secondary | ICD-10-CM | POA: Insufficient documentation

## 2015-06-14 DIAGNOSIS — R1032 Left lower quadrant pain: Secondary | ICD-10-CM | POA: Insufficient documentation

## 2015-06-14 DIAGNOSIS — R102 Pelvic and perineal pain: Secondary | ICD-10-CM

## 2015-06-14 DIAGNOSIS — N941 Dyspareunia: Secondary | ICD-10-CM | POA: Diagnosis not present

## 2015-08-04 ENCOUNTER — Encounter: Payer: Self-pay | Admitting: Student

## 2016-02-11 ENCOUNTER — Encounter (HOSPITAL_COMMUNITY): Payer: Self-pay | Admitting: *Deleted

## 2016-02-11 ENCOUNTER — Emergency Department (HOSPITAL_COMMUNITY)
Admission: EM | Admit: 2016-02-11 | Discharge: 2016-02-11 | Disposition: A | Discharge status: Home / Self Care | Payer: Worker's Compensation | Attending: Emergency Medicine | Admitting: Emergency Medicine

## 2016-02-11 DIAGNOSIS — Z87891 Personal history of nicotine dependence: Secondary | ICD-10-CM | POA: Insufficient documentation

## 2016-02-11 DIAGNOSIS — Z8659 Personal history of other mental and behavioral disorders: Secondary | ICD-10-CM | POA: Diagnosis not present

## 2016-02-11 DIAGNOSIS — X58XXXA Exposure to other specified factors, initial encounter: Secondary | ICD-10-CM | POA: Diagnosis not present

## 2016-02-11 DIAGNOSIS — Z8679 Personal history of other diseases of the circulatory system: Secondary | ICD-10-CM | POA: Diagnosis not present

## 2016-02-11 DIAGNOSIS — T1592XA Foreign body on external eye, part unspecified, left eye, initial encounter: Secondary | ICD-10-CM

## 2016-02-11 DIAGNOSIS — J45909 Unspecified asthma, uncomplicated: Secondary | ICD-10-CM | POA: Insufficient documentation

## 2016-02-11 DIAGNOSIS — Z8739 Personal history of other diseases of the musculoskeletal system and connective tissue: Secondary | ICD-10-CM | POA: Diagnosis not present

## 2016-02-11 DIAGNOSIS — Z862 Personal history of diseases of the blood and blood-forming organs and certain disorders involving the immune mechanism: Secondary | ICD-10-CM | POA: Diagnosis not present

## 2016-02-11 DIAGNOSIS — Y9389 Activity, other specified: Secondary | ICD-10-CM | POA: Diagnosis not present

## 2016-02-11 DIAGNOSIS — Z8742 Personal history of other diseases of the female genital tract: Secondary | ICD-10-CM | POA: Insufficient documentation

## 2016-02-11 DIAGNOSIS — Z8619 Personal history of other infectious and parasitic diseases: Secondary | ICD-10-CM | POA: Diagnosis not present

## 2016-02-11 DIAGNOSIS — Y9289 Other specified places as the place of occurrence of the external cause: Secondary | ICD-10-CM | POA: Insufficient documentation

## 2016-02-11 DIAGNOSIS — Y998 Other external cause status: Secondary | ICD-10-CM | POA: Diagnosis not present

## 2016-02-11 MED ORDER — FLUORESCEIN SODIUM 1 MG OP STRP
1.0000 | ORAL_STRIP | Freq: Once | OPHTHALMIC | Status: AC
  Administered 2016-02-11: 1 via OPHTHALMIC
  Filled 2016-02-11: qty 1

## 2016-02-11 MED ORDER — TETRACAINE HCL 0.5 % OP SOLN
2.0000 [drp] | Freq: Once | OPHTHALMIC | Status: AC
  Administered 2016-02-11: 2 [drp] via OPHTHALMIC
  Filled 2016-02-11: qty 4

## 2016-02-11 MED ORDER — FENTANYL CITRATE (PF) 100 MCG/2ML IJ SOLN
50.0000 ug | Freq: Once | INTRAMUSCULAR | Status: AC
  Administered 2016-02-11: 50 ug via NASAL
  Filled 2016-02-11: qty 2

## 2016-02-11 MED ORDER — OXYCODONE-ACETAMINOPHEN 5-325 MG PO TABS
1.0000 | ORAL_TABLET | Freq: Once | ORAL | Status: AC
  Administered 2016-02-11: 1 via ORAL
  Filled 2016-02-11: qty 1

## 2016-02-11 MED ORDER — ERYTHROMYCIN 5 MG/GM OP OINT
TOPICAL_OINTMENT | Freq: Four times a day (QID) | OPHTHALMIC | Status: DC
  Administered 2016-02-11: 03:00:00 via OPHTHALMIC
  Filled 2016-02-11: qty 3.5

## 2016-02-11 NOTE — ED Notes (Signed)
Pt was at work doing Special educational needs teacher in window at [employer] and someone threw a ramkin in window and a piece broke off and flew in pt's left eye,  She tried to remove the small black piece but the pain was too much

## 2016-02-11 NOTE — ED Provider Notes (Signed)
CSN: [id number]     Arrival date & time 02/11/16  0022 History   First MD Initiated Contact with Patient 02/11/16 0135     Chief Complaint  Patient presents with  . Foreign Body in Eye     (Consider location/radiation/quality/duration/timing/severity/associated sxs/prior Treatment) Patient is a 25 y.o. female presenting with foreign body in eye. The history is provided by the patient. No language interpreter was used.  Foreign Body in Eye This is a new problem. The current episode started today. Associated symptoms comments: Patient at her work in [employer] when a serving plate dropped to the floor causing a piece to lodge in her left eye. No visual changes. No other injury..    Past Medical History  Diagnosis Date  . Hyperventilation   . Anemia 2012  . Migraine 2010  . Ovarian cyst 2012    Diagnosed on pelvic ultrasound. F/u Ultrasound 06/2012 revealed no cyst.   . PID (pelvic inflammatory disease) 2012    GC and chlamydia   . Asthma 2009    last used inhaler 2009  . Yeast infection   . Hx of chlamydia infection   . History of gonorrhea   . H/O varicella   . Anxiety   . Allergy   . Arthritis   . Depression    History reviewed. No pertinent past surgical history. Family History  Problem Relation Age of Onset  . Hyperlipidemia Mother   . Hypertension Mother   . Kidney disease Mother   . Hypertension Father   . Hypertension Brother   . Hyperlipidemia Brother   . Cancer Maternal Aunt 58    Ovarian CA  . Diabetes Maternal Grandmother   . Diabetes Maternal Grandfather    Social History  Substance Use Topics  . Smoking status: Former Smoker    Start date: 09/15/2012  . Smokeless tobacco: Never Used  . Alcohol Use: No   OB History    Gravida Para Term Preterm AB TAB SAB Ectopic Multiple Living       Review of Systems  Eyes: Positive for pain. Negative for visual disturbance.      Allergies  Review of patient's allergies indicates no  known allergies.  Home Medications   Prior to Admission medications   Not on File   BP 117/68 mmHg  Pulse 84  Temp(Src) 98.7 F (37.1 C) (Oral)  Resp 18  Ht 5' (1.524 m)  Wt 69.4 kg  BMI 29.88 kg/m2  SpO2 98%  LMP 01/20/2016 Physical Exam  Constitutional: She appears well-developed and well-nourished. No distress.  HENT:  Head: Normocephalic.  Eyes: Conjunctivae are normal.  No hyphema of left eye. No lid swelling or redness. Left eye excessively tearing. There is a small black FB in upper lid on inversion, medial aspect. No fluorescein uptake to indicate corneal abrasion.     ED Course  Procedures (including critical care time) Labs Review Labs Reviewed - No data to display  Imaging Review No results found. I have personally reviewed and evaluated these images and lab results as part of my medical decision-making.   EKG Interpretation None     PROCEDURE:  Tetracaine drops instilled in the left eye with pain relief. Cotton tip used to removed FB from upper lid without difficulty. MDM   Final diagnoses:  None    1. FB left eye, removed  FB removed. No corneal abrasion present. Will cover with erythromycin ointment for 3 days.  Refer to ophthalmology prn for any persistent pain.      Elpidio Anis, PA-C 02/11/16 0239  April Palumbo, MD 02/11/16 [phone]

## 2016-02-11 NOTE — Discharge Instructions (Signed)
Eye Foreign Body  A foreign body refers to any object on the surface of the eye or in the eyeball that should not be there. A foreign body may be a small speck of dirt or dust, a hair or eyelash, a splinter, or any other object.   SIGNS AND SYMPTOMS  Symptoms depend on what the foreign body is and where it is in the eye. The most common locations are:    On the inner surface of the upper or lower eyelids or on the covering of the white part of the eye (conjunctiva). Symptoms in this location are:    Pain and irritation, especially when blinking.    The feeling that something is in the eye.   On the surface of the clear covering on the front of the eye (cornea). Symptoms in this location include:    Pain and irritation.     Small "rust rings" around a metallic foreign body.    The feeling that something is in the eye.    Inside the eyeball. Foreign bodies inside the eye may cause:     Great pain.     Immediate loss of vision.     Distortion of the pupil.  DIAGNOSIS   Foreign bodies are found during an exam by an eye specialist. Those on the eyelids, conjunctiva, or cornea are usually (but not always) easily found. When a foreign body is inside the eyeball, a cloudiness of the lens (cataract) may form almost right away. This makes it hard for an eye specialist to find the foreign body. Tests may be needed, including ultrasound testing, X-rays, and CT scans.  TREATMENT    Foreign bodies on the eyelids, conjunctiva, or cornea are often removed easily and painlessly.   Rust in the cornea may require the use of a drill-like instrument to remove the rust.   If the foreign body has caused a scratch or a rubbing or scraping (abrasion) of the cornea, this may be treated with antibiotic drops or ointment. A pressure patch may be put over your eye.   If the foreign body is inside your eyeball, surgery is needed right away. This is a medical emergency. Foreign bodies inside the eye threaten vision. A person may even  lose his or her eye.  HOME CARE INSTRUCTIONS    Take medicines only as directed by your health care provider. Use eye drops or ointment as directed.   If no eye patch was applied:    Keep your eye closed as much as possible.    Do not rub your eye.    Wear dark glasses as needed to protect your eyes from bright light.    Do not wear contact lenses until your eye feels normal again, or as instructed by your health care provider.    Wear a protective eye covering if there is a risk of eye injury. This is important when working with high-speed tools.   If your eye is patched:    Follow your health care provider's instructions for when to remove the patch.    Do notdrive or operate machinery if your eye is patched. Your ability to judge distances is impaired.   Keep all follow-up visits as directed by your health care provider. This is important.  SEEK MEDICAL CARE IF:    You have increased pain in your eye.   Your vision gets worse.    You have problems with your eye patch.    You have fluid (discharge)   coming from your injured eye.    You have redness and swelling around your affected eye.   MAKE SURE YOU:    Understand these instructions.   Will watch your condition.   Will get help right away if you are not doing well or get worse.     This information is not intended to replace advice given to you by your health care provider. Make sure you discuss any questions you have with your health care provider.     Document Released: 12/09/2005 Document Revised: 12/30/2014 Document Reviewed: 05/06/2013  Elsevier Interactive Patient Education 2016 [employer].

## 2016-02-11 NOTE — ED Notes (Signed)
Percocet given; pt still screaming in pain and unable to open left eye. Protocol order for fentanyl was ordered by this RN. Spoke with Lavonna Rua PA who said to put in 2 drops of tetracaine in left eye

## 2016-02-11 NOTE — ED Notes (Signed)
A black speck is visible above the pt's left eye. 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

## 2016-02-17 ENCOUNTER — Ambulatory Visit (INDEPENDENT_AMBULATORY_CARE_PROVIDER_SITE_OTHER): Payer: Worker's Compensation | Admitting: Family Medicine

## 2016-02-17 VITALS — BP 114/68 | HR 72 | Temp 98.6°F | Resp 19 | Ht 65.0 in | Wt 158.4 lb

## 2016-02-17 DIAGNOSIS — H019 Unspecified inflammation of eyelid: Secondary | ICD-10-CM

## 2016-02-17 DIAGNOSIS — T1592XS Foreign body on external eye, part unspecified, left eye, sequela: Secondary | ICD-10-CM | POA: Diagnosis not present

## 2016-02-17 MED ORDER — ERYTHROMYCIN 5 MG/GM OP OINT: 1.0000 "application " | TOPICAL_OINTMENT | Freq: Four times a day (QID) | OPHTHALMIC | Status: DC

## 2016-02-17 MED ORDER — CIPROFLOXACIN HCL 0.3 % OP SOLN: 2.0000 [drp] | OPHTHALMIC | Status: DC

## 2016-02-17 NOTE — Patient Instructions (Addendum)
Keep it covered - wear sunglasses, think about a blackout cover while you are sleeping.  DO NOT SCRATCH IT - YOU WILL SCRATCH YOUR EYEBALL. Recheck Monday morning.  I have put in a stat optho referral for you to be seen on Monday Put in the erythromycin drop up to every hour but at least four times a day.  Corneal Abrasion The cornea is the clear covering at the front and center of the eye. When looking at the colored portion of the eye (iris), you are looking through the cornea. This very thin tissue is made up of many layers. The surface layer is a single layer of cells (corneal epithelium) and is one of the most sensitive tissues in the body. If a scratch or injury causes the corneal epithelium to come off, it is called a corneal abrasion. If the injury extends to the tissues below the epithelium, the condition is called a corneal ulcer. CAUSES   Scratches.  Trauma.  Foreign body in the eye. Some people have recurrences of abrasions in the area of the original injury even after it has healed (recurrent erosion syndrome). Recurrent erosion syndrome generally improves and goes away with time. SYMPTOMS   Eye pain.  Difficulty or inability to keep the injured eye open.  The eye becomes very sensitive to light.  Recurrent erosions tend to happen suddenly, first thing in the morning, usually after waking up and opening the eye. DIAGNOSIS  Your health care provider can diagnose a corneal abrasion during an eye exam. Dye is usually placed in the eye using a drop or a small paper strip moistened by your tears. When the eye is examined with a special light, the abrasion shows up clearly because of the dye. TREATMENT   Small abrasions may be treated with antibiotic drops or ointment alone.  A pressure patch may be put over the eye. If this is done, follow your doctor's instructions for when to remove the patch. Do not drive or use machines while the eye patch is on. Judging distances is hard to  do with a patch on. If the abrasion becomes infected and spreads to the deeper tissues of the cornea, a corneal ulcer can result. This is serious because it can cause corneal scarring. Corneal scars interfere with light passing through the cornea and cause a loss of vision in the involved eye. HOME CARE INSTRUCTIONS  Use medicine or ointment as directed. Only take over-the-counter or prescription medicines for pain, discomfort, or fever as directed by your health care provider.  Do not drive or operate machinery if your eye is patched. Your ability to judge distances is impaired.  If your health care provider has given you a follow-up appointment, it is very important to keep that appointment. Not keeping the appointment could result in a severe eye infection or permanent loss of vision. If there is any problem keeping the appointment, let your health care provider know. SEEK MEDICAL CARE IF:   You have pain, light sensitivity, and a scratchy feeling in one eye or both eyes.  Your pressure patch keeps loosening up, and you can blink your eye under the patch after treatment.  Any kind of discharge develops from the eye after treatment or if the lids stick together in the morning.  You have the same symptoms in the morning as you did with the original abrasion days, weeks, or months after the abrasion healed.   This information is not intended to replace advice given to you  by your health care provider. Make sure you discuss any questions you have with your health care provider.   Document Released: 12/06/2000 Document Revised: 08/30/2015 Document Reviewed: 08/16/2013 Elsevier Interactive Patient Education [employer]

## 2016-02-17 NOTE — Progress Notes (Addendum)
Subjective:  By signing my name below, I, Rawaa Al Rifaie, attest that this documentation has been prepared under the direction and in the presence of Norberto Sorenson, MD.  Broadus John, Medical Scribe. 02/17/2016.  4:06 PM.   Patient ID: Shelby Graves, female    DOB: Mar 30, 1991, 25 y.o.   MRN: [id number]  Chief Complaint  Patient presents with  . Eye Drainage    itching, W/C  . Depression    see screening    HPI HPI Comments: Shelby Graves is a 25 y.o. female who presents to [facility] complaining of left eye pain. Pt reports that a week ago, she had an incident at work where she had a foreign object in her left eye. She went to the ED where they removed the foreign object that presented with no corneal abrasion. She was prescribed erythromycin for 3 days. Pt states that since her ED visit, her eyes have been itching, and notes having associated photophobia. Pt has been applying the ointment since her ED visit 1-2 times per day (4 days more than prescribed) and reports having vision changes with it. Pt states that she woke up today not being able to open her eyes. she indicates that she has the sensation as if there is a grind of sand is in her eye. She also has associated clear discharge,  increased changes in vision, and continuing sympotms of photophobia and the persistent itching. Pt does not usually wear sun-glasses. She reports no antibiotics allergies.    No Known Allergies  Prior to Admission medications   Not on File    Review of Systems  Constitutional: Positive for activity change.  HENT: Negative for facial swelling.   Eyes: Positive for photophobia, pain, discharge, itching and visual disturbance. Negative for redness.  Neurological: Positive for facial asymmetry and headaches. Negative for numbness.  Hematological: Does not bruise/bleed easily.  Psychiatric/Behavioral: The patient is nervous/anxious.       Objective:   Physical Exam    Constitutional: She is oriented to person, place, and time. She appears well-developed and well-nourished. No distress.  HENT:  Head: Normocephalic and atraumatic.  Eyes: EOM are normal. Pupils are equal, round, and reactive to light.  Little hemorrhage collection around 3 and 7 o'clock, but no uptake of the fluorescein.  2 mm nodule in the inner upper lid, possible yellowish pustule.  Fundoscopic exam is benign but difficult to tolerate due to pt's discomfort. No fluorescein uptake seen.  Neck: Neck supple.  Cardiovascular: Normal rate.   Pulmonary/Chest: Effort normal.  Neurological: She is alert and oriented to person, place, and time. No cranial nerve deficit.  Skin: Skin is warm and dry.  Psychiatric: She has a normal mood and affect. Her behavior is normal.  Nursing note and vitals reviewed.   BP 114/68 mmHg  Pulse 72  Temp(Src) 98.6 F (37 C) (Oral)  Resp 19  Ht  (1.651 m)  Wt 158 lb 6.4 oz (71.85 kg)  BMI 26.36 kg/m2  SpO2 95%  LMP 01/20/2016     Visual Acuity Screening   Right eye Left eye Both eyes  Without correction:  With correction:       Assessment & Plan:   1. Inflammatory lesion of eyelid   2. Foreign body in eye, left, sequela   It appears that pt has an inflammatory reaction around the site of the foreign body that was removed in the ER 1 wk prior from  inner upper conjunctiva of eyelid. I do not see any foreign body and the eye itself appears normal on exam.   However, the fact that pt is c/o blurred vision and photophobia that is worsening in her left eye is VERY concerning to me as is the mechanism of injury with a piece of plate flew up into her eye. After proparacaine was applied vision exam is normal and photophobia largely resolved. However, pt was really unable to tolerate the exam when I had her eyelid flipped so I would have liked to better examine the area.  Pt reports she has had a lot of difficulty applying the romycin gel  which she has done 1-2x/d so will switch to gtts instead. Advised eye protection and to be very cautious not to rub eye.  Placed stat optho referral and will have pt recheck on Mon morning with [place] if she has not heard about an appointment so that we can decide on the acuity at that time. oow until recheck  Orders Placed This Encounter  Procedures  . Ambulatory referral to Ophthalmology    Referral Priority:  Urgent    Referral Type:  Consultation    Referral Reason:  Specialty Services Required    Requested Specialty:  Ophthalmology    Number of Visits Requested:  1    Meds ordered this encounter  Medications  . erythromycin [facility]) ophthalmic ointment    Sig: Place 1 application into the left eye 4 (four) times daily. Use up to every hour until relief.    Dispense:  3.5 g    Refill:  0  . ciprofloxacin (CILOXAN) 0.3 % ophthalmic solution    Sig: Place 2 drops into the left eye every 2 (two) hours. Administer 1 drop, every 2 hours, while awake, for 2 days    Dispense:  10 mL    Refill:  0    I personally performed the services described in this documentation, which was scribed in my presence. The recorded information has been reviewed and considered, and addended by me as needed.  Norberto Sorenson, MD MPH

## 2016-02-19 ENCOUNTER — Ambulatory Visit (INDEPENDENT_AMBULATORY_CARE_PROVIDER_SITE_OTHER): Payer: Worker's Compensation | Admitting: Physician Assistant

## 2016-02-19 VITALS — BP 102/60 | HR 85 | Temp 98.5°F | Resp 18 | Ht 65.0 in | Wt 158.6 lb

## 2016-02-19 DIAGNOSIS — H5712 Ocular pain, left eye: Secondary | ICD-10-CM

## 2016-02-19 DIAGNOSIS — H0289 Other specified disorders of eyelid: Secondary | ICD-10-CM

## 2016-02-19 NOTE — Progress Notes (Signed)
02/19/2016 6:53 PM   DOB: 08-24-1991 / MRN: [id number]  SUBJECTIVE:  Shelby Graves is a 25 y.o. female presenting for eye lid pain that started on 02/11/16 after she was struck in the eye by an object at work. She presented to the ED and had a foreign body removed from her upper medial eyelid using a cotton swab.  She presented to Dr. Clelia Croft on 02/17/16 for follow up and was placed on Cipro drops and referred to Dr. Dione Booze.  Today she feels her symptoms are worse.  She has been compliant with Cipro drops.   She reports early morning crusting along with excessive tearing.  She denies photophobia, nausea, and vision problems.       She has No Known Allergies.   She  has a past medical history of Hyperventilation; Anemia (2012); Migraine (2010); Ovarian cyst (2012); PID (pelvic inflammatory disease) (2012); Asthma (2009); Yeast infection; chlamydia infection; History of gonorrhea; H/O varicella; Anxiety; Allergy; Arthritis; and Depression.    She  reports that she has quit smoking. She started smoking about 3 years ago. She has never used smokeless tobacco. She reports that she does not drink alcohol or use illicit drugs. She  reports that she does not currently engage in sexual activity. She reports using the following method of birth control/protection: Condom. The patient  has no past surgical history on file.  Her family history includes Cancer (age of onset: 40) in her maternal aunt; Diabetes in her maternal grandfather and maternal grandmother; Hyperlipidemia in her brother and mother; Hypertension in her brother, father, and mother; Kidney disease in her mother.  Review of Systems  Constitutional: Negative for fever and chills.  Eyes: Positive for discharge. Negative for blurred vision, double vision, pain and redness.  Respiratory: Negative for cough and shortness of breath.   Cardiovascular: Negative for chest pain.  Gastrointestinal: Negative for nausea and abdominal pain.  Genitourinary:  Negative for dysuria, urgency and frequency.  Musculoskeletal: Negative for myalgias.  Skin: Negative for rash.  Neurological: Negative for dizziness, tingling and headaches.  Psychiatric/Behavioral: Negative for depression. The patient is not nervous/anxious.     Problem list and medications reviewed and updated by myself where necessary, and exist elsewhere in the encounter.   OBJECTIVE:  BP 102/60 mmHg  Pulse 85  Temp(Src) 98.5 F (36.9 C) (Oral)  Resp 18  Ht  (1.651 m)  Wt 158 lb 9.6 oz (71.94 kg)  BMI 26.39 kg/m2  SpO2 94%  LMP 01/20/2016  Physical Exam  Constitutional: She is oriented to person, place, and time. She appears well-developed.  Eyes: Conjunctivae, EOM and lids are normal. Pupils are equal, round, and reactive to light. Lids are everted and swept, no foreign bodies found.  Slit lamp exam:      The left eye shows no corneal abrasion, no corneal flare, no corneal ulcer, no foreign body, no hyphema, no hypopyon, no fluorescein uptake and no anterior chamber bulge.  Left lid inverted.  I see no abnormalities.   Cardiovascular: Normal rate.   Pulmonary/Chest: Effort normal.  Abdominal: She exhibits no distension.  Musculoskeletal: Normal range of motion.  Neurological: She is alert and oriented to person, place, and time. No cranial nerve deficit.  Skin: Skin is warm and dry. She is not diaphoretic.  Psychiatric: She has a normal mood and affect.  Vitals reviewed.   No results found for this or any previous visit (from the past 72 hour(s)).  No results found.  Vision screened by  me after proparacaine and right eye 20/30 and left 20/25.  ASSESSMENT AND PLAN  Shelby Graves was seen today for follow-up.  Diagnoses and all orders for this visit:  Eyelid pain, left:   She was seen resting comfortably upon entering the room. I can not explain why she is having so much pain.  Her exam is normal today. Will coordinate with referrals to ensure she is quickly sent  to Dr. Dione Booze for an evaluation.    The patient was advised to call or return to clinic if she does not see an improvement in symptoms or to seek the care of the closest emergency department if she worsens with the above plan.   Deliah Boston, MHS, PA-C [facility] 02/19/2016 6:53 PM

## 2016-02-20 NOTE — Progress Notes (Signed)
That's great thank you.  Deliah Boston, MS, PA-C 2:26 PM, 02/20/2016

## 2016-03-15 ENCOUNTER — Ambulatory Visit (INDEPENDENT_AMBULATORY_CARE_PROVIDER_SITE_OTHER): Payer: BLUE CROSS/BLUE SHIELD | Admitting: Student

## 2016-03-15 VITALS — BP 111/60 | HR 89 | Temp 97.7°F | Wt 149.5 lb

## 2016-03-15 DIAGNOSIS — K529 Noninfective gastroenteritis and colitis, unspecified: Secondary | ICD-10-CM | POA: Diagnosis not present

## 2016-03-15 NOTE — Assessment & Plan Note (Signed)
Nausea vomiting likely secondary to viral gastroenteritis. Now with resolving symptoms. - Patient advised to maintain adequate hydration and to eat light foods until she can to better tolerate heavier foods. - Will follow as needed.

## 2016-03-15 NOTE — Progress Notes (Signed)
   Subjective:    Patient ID: Shelby Graves, female    DOB: 09/26/1991, 25 y.o.   MRN: [id number]   CC: nausea/vomiting  HPI: 25 year old female presenting for nausea and vomiting.  Nausea vomiting - Began on 3/18 after returning from [place] to visit family. [name] in [place] with ill with nausea vomiting at that time. - Patient denies fevers, chest pain, short of breath. - Patient last vomited on 3/21 and last had diarrhea on 3/22 - She attributes this to taking a probiotic yogurt on 3/22 - She has been able to food and drink and is able to eat breakfast this morning.    Review of Systems ROS  Per history of present illness otherwise patient denies headache, changes in vision, weakness.  Past Medical, Surgical, Social, and Family History Reviewed & Updated per EMR.   Objective:  BP 111/60 mmHg  Pulse 89  Temp(Src) 97.7 F (36.5 C) (Oral)  Wt 149 lb 8 oz (67.813 kg)  LMP 02/21/2016 (Exact Date) Vitals and nursing note reviewed  General: NAD Cardiac: RRR, normal heart sounds Respiratory: CTAB, normal effort Abdomen: soft, nontender, nondistended. Bowel sounds present Skin: warm and dry, no rashes noted Neuro: alert and oriented, no focal deficits   Assessment & Plan:    Noninfectious gastroenteritis and colitis Nausea vomiting likely secondary to viral gastroenteritis. Now with resolving symptoms. - Patient advised to maintain adequate hydration and to eat light foods until she can to better tolerate heavier foods. - Will follow as needed.     Alyssa A. Kennon RoundsHaney MD, MS Family Medicine Resident PGY-2 Pager [phone]

## 2016-03-15 NOTE — Patient Instructions (Signed)
Follow-up as needed. You likely had a viral gastroenteritis. Maintain adequate hydration, and eat light foods until you can tolerate heavier foods If questions or concerns call the office at [phone].

## 2016-05-21 ENCOUNTER — Encounter (HOSPITAL_COMMUNITY): Payer: Self-pay | Admitting: *Deleted

## 2016-05-21 ENCOUNTER — Inpatient Hospital Stay (HOSPITAL_COMMUNITY): Payer: Medicaid Other

## 2016-05-21 ENCOUNTER — Inpatient Hospital Stay (HOSPITAL_COMMUNITY)
Admission: AD | Admit: 2016-05-21 | Discharge: 2016-05-21 | Disposition: A | Discharge status: Home / Self Care | Payer: Medicaid Other | Source: Ambulatory Visit | Attending: Obstetrics and Gynecology | Admitting: Obstetrics and Gynecology

## 2016-05-21 DIAGNOSIS — R109 Unspecified abdominal pain: Secondary | ICD-10-CM | POA: Diagnosis not present

## 2016-05-21 DIAGNOSIS — O9989 Other specified diseases and conditions complicating pregnancy, childbirth and the puerperium: Secondary | ICD-10-CM | POA: Diagnosis not present

## 2016-05-21 DIAGNOSIS — Z3A01 Less than 8 weeks gestation of pregnancy: Secondary | ICD-10-CM

## 2016-05-21 DIAGNOSIS — O26891 Other specified pregnancy related conditions, first trimester: Secondary | ICD-10-CM | POA: Diagnosis not present

## 2016-05-21 DIAGNOSIS — Z87891 Personal history of nicotine dependence: Secondary | ICD-10-CM | POA: Diagnosis not present

## 2016-05-21 DIAGNOSIS — R102 Pelvic and perineal pain: Secondary | ICD-10-CM | POA: Diagnosis present

## 2016-05-21 DIAGNOSIS — Z3490 Encounter for supervision of normal pregnancy, unspecified, unspecified trimester: Secondary | ICD-10-CM

## 2016-05-21 DIAGNOSIS — O26899 Other specified pregnancy related conditions, unspecified trimester: Secondary | ICD-10-CM

## 2016-05-21 LAB — CBC WITH DIFFERENTIAL/PLATELET
BASOS ABS: 0 10*3/uL (ref 0.0–0.1)
Basophils Relative: 0 %
EOS PCT: 1 %
Eosinophils Absolute: 0.1 10*3/uL (ref 0.0–0.7)
HCT: 30.8 % — ABNORMAL LOW (ref 36.0–46.0)
Hemoglobin: 10.8 g/dL — ABNORMAL LOW (ref 12.0–15.0)
LYMPHS PCT: 23 %
Lymphs Abs: 2.3 10*3/uL (ref 0.7–4.0)
MCH: 24.5 pg — ABNORMAL LOW (ref 26.0–34.0)
MCHC: 35.1 g/dL (ref 30.0–36.0)
MCV: 69.8 fL — AB (ref 78.0–100.0)
Monocytes Absolute: 0.7 10*3/uL (ref 0.1–1.0)
Monocytes Relative: 7 %
NEUTROS ABS: 6.9 10*3/uL (ref 1.7–7.7)
Neutrophils Relative %: 70 %
PLATELETS: 339 10*3/uL (ref 150–400)
RBC: 4.41 MIL/uL (ref 3.87–5.11)
RDW: 16.9 % — ABNORMAL HIGH (ref 11.5–15.5)
WBC: 9.9 10*3/uL (ref 4.0–10.5)

## 2016-05-21 LAB — ABO/RH: ABO/RH(D): AB POS

## 2016-05-21 LAB — POCT PREGNANCY, URINE: PREG TEST UR: POSITIVE — AB

## 2016-05-21 LAB — HCG, QUANTITATIVE, PREGNANCY: hCG, Beta Chain, Quant, S: 72401 m[IU]/mL — ABNORMAL HIGH (ref <5)

## 2016-05-21 NOTE — Discharge Instructions (Signed)
Prenatal Care Providers °[facility]    [facility] ° Phone- [phone]     Phone: [phone] °         °[facility]                      [facility] ° @[place]     Phone: [phone] ° Phone: [phone] °        [facility] °[facility]     Phone: [phone] ° Phone: [phone]   °        [facility] °[facility] [place]                hone: [phone] ° Phone: [phone] °        [facility] °Dr[facility] Bernard Marshall      Phone: [phone] ° Phone: [phone] °        [facility] °[facility]                Phone: [phone] ° Women’s [facility]  ° Phone:[phone]    [facility] °         Phone: [phone] °[facility] °  Phone: [phone] °Safe Medications in Pregnancy  ° °Acne: °Benzoyl Peroxide °Salicylic Acid ° °Backache/Headache: °Tylenol: 2 regular strength every 4 hours OR °             2 Extra strength every 6 hours ° °Colds/Coughs/Allergies: °Benadryl (alcohol free) 25 mg every 6 hours as needed °Breath right strips °Claritin °Cepacol throat lozenges °Chloraseptic throat spray °Cold-Eeze- up to three times per day °Cough drops, alcohol free °Flonase (by prescription only) °Guaifenesin °Mucinex °Robitussin DM (plain only, alcohol free) °Saline nasal spray/drops °Sudafed (pseudoephedrine) & Actifed ** use only after [redacted] weeks gestation and if you do not have high blood pressure °Tylenol °Vicks Vaporub °Zinc lozenges °Zyrtec  ° °Constipation: °Colace °Ducolax suppositories °Fleet enema °Glycerin suppositories °Metamucil °Milk of magnesia °Miralax °Senokot °Smooth move tea ° °Diarrhea: °Kaopectate °Imodium A-D ° °*NO pepto Bismol ° °Hemorrhoids: °Anusol °Anusol HC °Preparation H °Tucks ° °Indigestion: °Tums °Maalox °Mylanta °Zantac  °Pepcid ° °Insomnia: °Benadryl (alcohol free) 25mg every 6 hours as needed °Tylenol  PM °Unisom, no Gelcaps ° °Leg Cramps: °Tums °MagGel ° °Nausea/Vomiting:  °Bonine °Dramamine °Emetrol °Ginger extract °Sea bands °Meclizine  °Nausea medication to take during pregnancy:  °Unisom (doxylamine succinate 25 mg tablets) Take one tablet daily at bedtime. If symptoms are not adequately controlled, the dose can be increased to a maximum recommended dose of two tablets daily (1/2 tablet in the morning, 1/2 tablet mid-afternoon and one at bedtime). °Vitamin B6 100mg tablets. Take one tablet twice a day (up to 200 mg per day). ° °Skin Rashes: °Aveeno products °Benadryl cream or 25mg every 6 hours as needed °Calamine Lotion °1% cortisone cream ° °Yeast infection: °Gyne-lotrimin 7 °Monistat 7 ° ° °**If taking multiple medications, please check labels to avoid duplicating the same active ingredients °**take medication as directed on the label °** Do not exceed 4000 mg of tylenol in 24 hours °**Do not take medications that contain aspirin or ibuprofen ° ° ° ° °First Trimester of Pregnancy °The first trimester of pregnancy is from week 1 until the end of week 12 (months 1 through 3). A week after a sperm fertilizes an egg, the egg will implant on the wall of the uterus. This   embryo will begin to develop into a baby. Genes from you and your partner are forming the baby. The female genes determine whether the baby is a boy or a girl. At 6-8 weeks, the eyes and face are formed, and the heartbeat can be seen on ultrasound. At the end of 12 weeks, all the baby's organs are formed.  °Now that you are pregnant, you will want to do everything you can to have a healthy baby. Two of the most important things are to get good prenatal care and to follow your health care provider's instructions. Prenatal care is all the medical care you receive before the baby's birth. This care will help prevent, find, and treat any problems during the pregnancy and childbirth. °BODY CHANGES °Your body goes through many changes during pregnancy.  The changes vary from woman to woman.  °· You may gain or lose a couple of pounds at first. °· You may feel sick to your stomach (nauseous) and throw up (vomit). If the vomiting is uncontrollable, call your health care provider. °· You may tire easily. °· You may develop headaches that can be relieved by medicines approved by your health care provider. °· You may urinate more often. Painful urination may mean you have a bladder infection. °· You may develop heartburn as a result of your pregnancy. °· You may develop constipation because certain hormones are causing the muscles that push waste through your intestines to slow down. °· You may develop hemorrhoids or swollen, bulging veins (varicose veins). °· Your breasts may begin to grow larger and become tender. Your nipples may stick out more, and the tissue that surrounds them (areola) may become darker. °· Your gums may bleed and may be sensitive to brushing and flossing. °· Dark spots or blotches (chloasma, mask of pregnancy) may develop on your face. This will likely fade after the baby is born. °· Your menstrual periods will stop. °· You may have a loss of appetite. °· You may develop cravings for certain kinds of food. °· You may have changes in your emotions from day to day, such as being excited to be pregnant or being concerned that something may go wrong with the pregnancy and baby. °· You may have more vivid and strange dreams. °· You may have changes in your hair. These can include thickening of your hair, rapid growth, and changes in texture. Some women also have hair loss during or after pregnancy, or hair that feels dry or thin. Your hair will most likely return to normal after your baby is born. °WHAT TO EXPECT AT YOUR PRENATAL VISITS °During a routine prenatal visit: °· You will be weighed to make sure you and the baby are growing normally. °· Your blood pressure will be taken. °· Your abdomen will be measured to track your baby's growth. °· The  fetal heartbeat will be listened to starting around week 10 or 12 of your pregnancy. °· Test results from any previous visits will be discussed. °Your health care provider may ask you: °· How you are feeling. °· If you are feeling the baby move. °· If you have had any abnormal symptoms, such as leaking fluid, bleeding, severe headaches, or abdominal cramping. °· If you are using any tobacco products, including cigarettes, chewing tobacco, and electronic cigarettes. °· If you have any questions. °Other tests that may be performed during your first trimester include: °· Blood tests to find your blood type and to check for the presence of any previous   infections. They will also be used to check for low iron levels (anemia) and Rh antibodies. Later in the pregnancy, blood tests for diabetes will be done along with other tests if problems develop. °· Urine tests to check for infections, diabetes, or protein in the urine. °· An ultrasound to confirm the proper growth and development of the baby. °· An amniocentesis to check for possible genetic problems. °· Fetal screens for spina bifida and Down syndrome. °· You may need other tests to make sure you and the baby are doing well. °· HIV (human immunodeficiency virus) testing. Routine prenatal testing includes screening for HIV, unless you choose not to have this test. °HOME CARE INSTRUCTIONS  °Medicines °· Follow your health care provider's instructions regarding medicine use. Specific medicines may be either safe or unsafe to take during pregnancy. °· Take your prenatal vitamins as directed. °· If you develop constipation, try taking a stool softener if your health care provider approves. °Diet °· Eat regular, well-balanced meals. Choose a variety of foods, such as meat or vegetable-based protein, fish, milk and low-fat dairy products, vegetables, fruits, and whole grain breads and cereals. Your health care provider will help you determine the amount of weight gain that  is right for you. °· Avoid raw meat and uncooked cheese. These carry germs that can cause birth defects in the baby. °· Eating four or five small meals rather than three large meals a day may help relieve nausea and vomiting. If you start to feel nauseous, eating a few soda crackers can be helpful. Drinking liquids between meals instead of during meals also seems to help nausea and vomiting. °· If you develop constipation, eat more high-fiber foods, such as fresh vegetables or fruit and whole grains. Drink enough fluids to keep your urine clear or pale yellow. °Activity and Exercise °· Exercise only as directed by your health care provider. Exercising will help you: °¨ Control your weight. °¨ Stay in shape. °¨ Be prepared for labor and delivery. °· Experiencing pain or cramping in the lower abdomen or low back is a good sign that you should stop exercising. Check with your health care provider before continuing normal exercises. °· Try to avoid standing for long periods of time. Move your legs often if you must stand in one place for a long time. °· Avoid heavy lifting. °· Wear low-heeled shoes, and practice good posture. °· You may continue to have sex unless your health care provider directs you otherwise. °Relief of Pain or Discomfort °· Wear a good support bra for breast tenderness.   °· Take warm sitz baths to soothe any pain or discomfort caused by hemorrhoids. Use hemorrhoid cream if your health care provider approves.   °· Rest with your legs elevated if you have leg cramps or low back pain. °· If you develop varicose veins in your legs, wear support hose. Elevate your feet for 15 minutes, 3-4 times a day. Limit salt in your diet. °Prenatal Care °· Schedule your prenatal visits by the twelfth week of pregnancy. They are usually scheduled monthly at first, then more often in the last 2 months before delivery. °· Write down your questions. Take them to your prenatal visits. °· Keep all your prenatal visits as  directed by your health care provider. °Safety °· Wear your seat belt at all times when driving. °· Make a list of emergency phone numbers, including numbers for family, friends, the hospital, and police and fire departments. °General Tips °· Ask your health care   provider for a referral to a local prenatal education class. Begin classes no later than at the beginning of month 6 of your pregnancy. °· Ask for help if you have counseling or nutritional needs during pregnancy. Your health care provider can offer advice or refer you to specialists for help with various needs. °· Do not use hot tubs, steam rooms, or saunas. °· Do not douche or use tampons or scented sanitary pads. °· Do not cross your legs for long periods of time. °· Avoid cat litter boxes and soil used by cats. These carry germs that can cause birth defects in the baby and possibly loss of the fetus by miscarriage or stillbirth. °· Avoid all smoking, herbs, alcohol, and medicines not prescribed by your health care provider. Chemicals in these affect the formation and growth of the baby. °· Do not use any tobacco products, including cigarettes, chewing tobacco, and electronic cigarettes. If you need help quitting, ask your health care provider. You may receive counseling support and other resources to help you quit. °· Schedule a dentist appointment. At home, brush your teeth with a soft toothbrush and be gentle when you floss. °SEEK MEDICAL CARE IF:  °· You have dizziness. °· You have mild pelvic cramps, pelvic pressure, or nagging pain in the abdominal area. °· You have persistent nausea, vomiting, or diarrhea. °· You have a bad smelling vaginal discharge. °· You have pain with urination. °· You notice increased swelling in your face, hands, legs, or ankles. °SEEK IMMEDIATE MEDICAL CARE IF:  °· You have a fever. °· You are leaking fluid from your vagina. °· You have spotting or bleeding from your vagina. °· You have severe abdominal cramping or  pain. °· You have rapid weight gain or loss. °· You vomit blood or material that looks like coffee grounds. °· You are exposed to German measles and have never had them. °· You are exposed to fifth disease or chickenpox. °· You develop a severe headache. °· You have shortness of breath. °· You have any kind of trauma, such as from a fall or a car accident. °  °This information is not intended to replace advice given to you by your health care provider. Make sure you discuss any questions you have with your health care provider. °  °Document Released: 12/03/2001 Document Revised: 12/30/2014 Document Reviewed: 10/19/2013 °Elsevier Interactive Patient Education ©2016 [employer] ° °

## 2016-05-21 NOTE — MAU Provider Note (Signed)
History     CSN: [id number]  Arrival date and time: 05/21/16 1801   None     No chief complaint on file.  Pelvic Pain The patient's primary symptoms include pelvic pain. This is a new problem. The current episode started 1 to 4 weeks ago. The problem occurs intermittently. The problem has been unchanged. Pain severity now: 5/10  The problem affects the left side. She is pregnant. Associated symptoms include abdominal pain and nausea. Pertinent negatives include no chills, constipation, diarrhea, dysuria, fever, frequency, urgency or vomiting. Exacerbated by: certain positions  She has tried nothing for the symptoms. She is sexually active. She uses nothing for contraception. Menstrual history: LMP 03/31/16    Past Medical History  Diagnosis Date  . Hyperventilation   . Anemia 2012  . Migraine 2010  . Ovarian cyst 2012    Diagnosed on pelvic ultrasound. F/u Ultrasound 06/2012 revealed no cyst.   . PID (pelvic inflammatory disease) 2012    GC and chlamydia   . Asthma 2009    last used inhaler 2009  . Yeast infection   . Hx of chlamydia infection   . History of gonorrhea   . H/O varicella   . Anxiety   . Allergy   . Arthritis   . Depression     No past surgical history on file.  Family History  Problem Relation Age of Onset  . Hyperlipidemia Mother   . Hypertension Mother   . Kidney disease Mother   . Hypertension Father   . Hypertension Brother   . Hyperlipidemia Brother   . Cancer Maternal Aunt 58    Ovarian CA  . Diabetes Maternal Grandmother   . Diabetes Maternal Grandfather     Social History  Substance Use Topics  . Smoking status: Former Smoker    Start date: 09/15/2012  . Smokeless tobacco: Never Used  . Alcohol Use: No    Allergies: No Known Allergies  Prescriptions prior to admission  Medication Sig Dispense Refill Last Dose  . ciprofloxacin (CILOXAN) 0.3 % ophthalmic solution Place 2 drops into the left eye every 2 (two) hours. Administer 1 drop,  every 2 hours, while awake, for 2 days 10 mL 0 Taking  . erythromycin [facility](ROMYCIN) ophthalmic ointment Place 1 application into the left eye 4 (four) times daily. Use up to every hour until relief. 3.5 g 0 Taking    Review of Systems  Constitutional: Negative for fever and chills.  Gastrointestinal: Positive for nausea and abdominal pain. Negative for vomiting, diarrhea and constipation.  Genitourinary: Positive for pelvic pain. Negative for dysuria, urgency and frequency.   Physical Exam   Blood pressure 112/73, pulse 98, temperature 98.5 F (36.9 C), resp. rate 18, weight 67.132 kg (148 lb), last menstrual period 03/31/2016.  Physical Exam  Nursing note and vitals reviewed. Constitutional: She is oriented to person, place, and time. She appears well-developed and well-nourished. No distress.  HENT:  Head: Normocephalic.  Cardiovascular: Normal rate.   Respiratory: Effort normal.  GI: Soft. There is no tenderness. There is no rebound.  Musculoskeletal: Normal range of motion.  Neurological: She is alert and oriented to person, place, and time.  Skin: Skin is warm and dry.  Psychiatric: She has a normal mood and affect.    Results for orders placed or performed during the hospital encounter of 05/21/16 (from the past 24 hour(s))  Pregnancy, urine POC     Status: Abnormal   Collection Time: 05/21/16  6:31 PM  Result Value  Ref Range   Preg Test, Ur POSITIVE (A) NEGATIVE  hCG, quantitative, pregnancy     Status: Abnormal   Collection Time: 05/21/16  6:43 PM  Result Value Ref Range   hCG, Beta Chain, Quant, S 72401 (H) <5 mIU/mL  ABO/Rh     Status: None (Preliminary result)   Collection Time: 05/21/16  6:43 PM  Result Value Ref Range   ABO/RH(D) AB POS   CBC with Differential/Platelet     Status: Abnormal   Collection Time: 05/21/16  6:43 PM  Result Value Ref Range   WBC 9.9 4.0 - 10.5 K/uL   RBC 4.41 3.87 - 5.11 MIL/uL   Hemoglobin 10.8 (L) 12.0 - 15.0 g/dL   HCT 16.1 (L)  09.6 - 46.0 %   MCV 69.8 (L) 78.0 - 100.0 fL   MCH 24.5 (L) 26.0 - 34.0 pg   MCHC 35.1 30.0 - 36.0 g/dL   RDW 04.5 (H) 40.9 - 81.1 %   Platelets 339 150 - 400 K/uL   Neutrophils Relative % 70 %   Neutro Abs 6.9 1.7 - 7.7 K/uL   Lymphocytes Relative 23 %   Lymphs Abs 2.3 0.7 - 4.0 K/uL   Monocytes Relative 7 %   Monocytes Absolute 0.7 0.1 - 1.0 K/uL   Eosinophils Relative 1 %   Eosinophils Absolute 0.1 0.0 - 0.7 K/uL   Basophils Relative 0 %   Basophils Absolute 0.0 0.0 - 0.1 K/uL   US Ob Comp Less 14 Wks  05/21/2016  CLINICAL DATA:  Chronic left pelvic pain EXAM: OBSTETRIC <14 WK [place] AND TRANSVAGINAL OB US TECHNIQUE: Both transabdominal and transvaginal ultrasound examinations were performed for complete evaluation of the gestation as well as the maternal uterus, adnexal regions, and pelvic cul-de-sac. Transvaginal technique was performed to assess early pregnancy. COMPARISON:  Pelvic ultrasound June 14, 2015 FINDINGS: Intrauterine gestational sac: Visualized Yolk sac:  Visualized Embryo:  Visualized Cardiac Activity: Visualized Heart Rate: 140  bpm CRL:  6  mm   6 w   3 d                  [place] EDC: January 11, 2017 Subchorionic hemorrhage:  None visualized. Maternal uterus/adnexae: Cervical os is closed. There is a physiologic corpus luteum in the right ovary measuring 2.0 x 1.3 x 1.2 cm. There is no other extrauterine pelvic or adnexal mass. No free pelvic fluid. IMPRESSION: Single live intrauterine gestation with estimated gestational age of 6+ weeks. Study otherwise unremarkable. Electronically Signed   By: Bretta Bang III M.D.   On: 05/21/2016 20:44   US Ob Transvaginal  05/21/2016  CLINICAL DATA:  Chronic left pelvic pain EXAM: OBSTETRIC <14 WK [place] AND TRANSVAGINAL OB US TECHNIQUE: Both transabdominal and transvaginal ultrasound examinations were performed for complete evaluation of the gestation as well as the maternal uterus, adnexal regions, and pelvic cul-de-sac. Transvaginal  technique was performed to assess early pregnancy. COMPARISON:  Pelvic ultrasound June 14, 2015 FINDINGS: Intrauterine gestational sac: Visualized Yolk sac:  Visualized Embryo:  Visualized Cardiac Activity: Visualized Heart Rate: 140  bpm CRL:  6  mm   6 w   3 d                  [place] EDC: January 11, 2017 Subchorionic hemorrhage:  None visualized. Maternal uterus/adnexae: Cervical os is closed. There is a physiologic corpus luteum in the right ovary measuring 2.0 x 1.3 x 1.2 cm. There is no other extrauterine pelvic or  adnexal mass. No free pelvic fluid. IMPRESSION: Single live intrauterine gestation with estimated gestational age of 6+ weeks. Study otherwise unremarkable. Electronically Signed   By: Bretta Bang III M.D.   On: 05/21/2016 20:44    MAU Course  Procedures  MDM   Assessment and Plan   1. Intrauterine pregnancy   2. Abdominal pain in pregnancy   3. [redacted] weeks gestation of pregnancy    DC home Comfort measures reviewed  1st Trimester precautions  Bleeding precautions RX: none  Return to MAU as needed FU with OB as planned  Follow-up Information    Schedule an appointment as soon as possible for a visit with [facility].   Contact information:   [address] [place] [address] [phone]       Tawnya Crook 05/21/2016, 8:55 PM

## 2016-05-21 NOTE — MAU Note (Signed)
Pt presents to MAU with complaints of lower abdominal cramping, sharp pain on her left side. Denies any vaginal bleeding or abnormal discharge. 3+ pregnancy tests this weekend.

## 2016-05-22 LAB — HIV ANTIBODY (ROUTINE TESTING W REFLEX): HIV SCREEN 4TH GENERATION: NONREACTIVE

## 2016-05-22 LAB — RPR: RPR Ser Ql: NONREACTIVE

## 2016-06-03 ENCOUNTER — Other Ambulatory Visit (HOSPITAL_COMMUNITY): Payer: Self-pay | Admitting: Nurse Practitioner

## 2016-06-03 DIAGNOSIS — Z369 Encounter for antenatal screening, unspecified: Secondary | ICD-10-CM

## 2016-06-14 ENCOUNTER — Inpatient Hospital Stay (HOSPITAL_COMMUNITY)
Admission: AD | Admit: 2016-06-14 | Discharge: 2016-06-14 | Disposition: A | Discharge status: Home / Self Care | Payer: Medicaid Other | Source: Ambulatory Visit | Attending: Family Medicine | Admitting: Family Medicine

## 2016-06-14 ENCOUNTER — Encounter (HOSPITAL_COMMUNITY): Payer: Self-pay | Admitting: *Deleted

## 2016-06-14 DIAGNOSIS — O219 Vomiting of pregnancy, unspecified: Secondary | ICD-10-CM | POA: Diagnosis not present

## 2016-06-14 DIAGNOSIS — Z3A10 10 weeks gestation of pregnancy: Secondary | ICD-10-CM | POA: Insufficient documentation

## 2016-06-14 DIAGNOSIS — K219 Gastro-esophageal reflux disease without esophagitis: Secondary | ICD-10-CM | POA: Diagnosis not present

## 2016-06-14 DIAGNOSIS — O210 Mild hyperemesis gravidarum: Secondary | ICD-10-CM | POA: Diagnosis present

## 2016-06-14 DIAGNOSIS — R4589 Other symptoms and signs involving emotional state: Secondary | ICD-10-CM

## 2016-06-14 DIAGNOSIS — F419 Anxiety disorder, unspecified: Secondary | ICD-10-CM | POA: Diagnosis not present

## 2016-06-14 DIAGNOSIS — O99011 Anemia complicating pregnancy, first trimester: Secondary | ICD-10-CM

## 2016-06-14 LAB — URINALYSIS, ROUTINE W REFLEX MICROSCOPIC
BILIRUBIN URINE: NEGATIVE
Glucose, UA: NEGATIVE mg/dL
HGB URINE DIPSTICK: NEGATIVE
KETONES UR: NEGATIVE mg/dL
NITRITE: NEGATIVE
Protein, ur: NEGATIVE mg/dL
Specific Gravity, Urine: <1.005 — ABNORMAL LOW (ref 1.005–1.030)
pH: 5.5 (ref 5.0–8.0)

## 2016-06-14 LAB — CBC
HCT: 29.3 % — ABNORMAL LOW (ref 36.0–46.0)
Hemoglobin: 10.3 g/dL — ABNORMAL LOW (ref 12.0–15.0)
MCH: 24.9 pg — ABNORMAL LOW (ref 26.0–34.0)
MCHC: 35.2 g/dL (ref 30.0–36.0)
MCV: 70.8 fL — ABNORMAL LOW (ref 78.0–100.0)
PLATELETS: 268 10*3/uL (ref 150–400)
RBC: 4.14 MIL/uL (ref 3.87–5.11)
RDW: 17.6 % — ABNORMAL HIGH (ref 11.5–15.5)
WBC: 8.4 10*3/uL (ref 4.0–10.5)

## 2016-06-14 LAB — URINE MICROSCOPIC-ADD ON

## 2016-06-14 MED ORDER — FERROUS SULFATE 325 (65 FE) MG PO TABS: 325.0000 mg | ORAL_TABLET | Freq: Every day | ORAL | Status: DC

## 2016-06-14 MED ORDER — METOCLOPRAMIDE HCL 5 MG/ML IJ SOLN
10.0000 mg | Freq: Once | INTRAMUSCULAR | Status: AC
Start: 2016-06-14 — End: 2016-06-14
  Administered 2016-06-14: 10 mg via INTRAVENOUS
  Filled 2016-06-14: qty 2

## 2016-06-14 MED ORDER — METOCLOPRAMIDE HCL 10 MG PO TABS: 10.0000 mg | ORAL_TABLET | Freq: Three times a day (TID) | ORAL | Status: DC

## 2016-06-14 MED ORDER — LACTATED RINGERS IV BOLUS (SEPSIS)
1000.0000 mL | Freq: Once | INTRAVENOUS | Status: AC
  Administered 2016-06-14: 1000 mL via INTRAVENOUS

## 2016-06-14 MED ORDER — HYDROXYZINE HCL 50 MG/ML IM SOLN
50.0000 mg | Freq: Once | INTRAMUSCULAR | Status: AC
  Administered 2016-06-14: 50 mg via INTRAMUSCULAR
  Filled 2016-06-14: qty 1

## 2016-06-14 MED ORDER — HYDROXYZINE PAMOATE 25 MG PO CAPS: 25.0000 mg | ORAL_CAPSULE | Freq: Three times a day (TID) | ORAL | Status: DC | PRN

## 2016-06-14 MED ORDER — FAMOTIDINE IN NACL 20-0.9 MG/50ML-% IV SOLN
20.0000 mg | Freq: Once | INTRAVENOUS | Status: AC
  Administered 2016-06-14: 20 mg via INTRAVENOUS
  Filled 2016-06-14: qty 50

## 2016-06-14 MED ORDER — RANITIDINE HCL 150 MG PO TABS: 150.0000 mg | ORAL_TABLET | Freq: Two times a day (BID) | ORAL | Status: DC

## 2016-06-14 NOTE — MAU Provider Note (Signed)
History     CSN: [id number]  Arrival date and time: 06/14/16 2039   First Provider Initiated Contact with Patient 06/14/16 2129      Chief Complaint  Patient presents with  . Morning Sickness  . Abdominal Cramping  . Chills  . Abdominal Pain   HPI   Ms.Shelby Graves is a 25 y.o. female G1P0000 @ 2365w5d presenting to MAU with N/V. She has not had problems with vomiting up until today. She has vomited 6 times today and now feels like she is vomiting up bile.   She has been having panic attacks since she found out she was pregnant. She has had 5 panic attacks this week.  On arrival to MAU the patient is very anxious, and crying. She states that she feels really bad and just wants to feel better. The father of the baby is not involved with the pregnancy and wants the patient to have an abortion. The patient would like to keep the pregnancy and does not have interest in having an abortion.   Recent US confirms IUP   OB History    Gravida Para Term Preterm AB TAB SAB Ectopic Multiple Living   1 0 0 0 0 0 0 0 0 0       Past Medical History  Diagnosis Date  . Hyperventilation   . Anemia 2012  . Migraine 2010  . Ovarian cyst 2012    Diagnosed on pelvic ultrasound. F/u Ultrasound 06/2012 revealed no cyst.   . PID (pelvic inflammatory disease) 2012    GC and chlamydia   . Asthma 2009    last used inhaler 2009  . Yeast infection   . Hx of chlamydia infection   . History of gonorrhea   . H/O varicella   . Anxiety   . Allergy   . Arthritis   . Depression     History reviewed. No pertinent past surgical history.  Family History  Problem Relation Age of Onset  . Hyperlipidemia Mother   . Hypertension Mother   . Kidney disease Mother   . Hypertension Father   . Hypertension Brother   . Hyperlipidemia Brother   . Cancer Maternal Aunt 58    Ovarian CA  . Diabetes Maternal Grandmother   . Diabetes Maternal Grandfather     Social History  Substance Use Topics  .  Smoking status: Former Smoker    Start date: 09/15/2012  . Smokeless tobacco: Never Used  . Alcohol Use: No    Allergies: No Known Allergies  Prescriptions prior to admission  Medication Sig Dispense Refill Last Dose  . Prenatal Vit-Fe Fumarate-FA (PRENATAL MULTIVITAMIN) TABS tablet Take 1 tablet by mouth daily at 12 noon.   06/14/2016 at Unknown time  . ciprofloxacin (CILOXAN) 0.3 % ophthalmic solution Place 2 drops into the left eye every 2 (two) hours. Administer 1 drop, every 2 hours, while awake, for 2 days (Patient not taking: Reported on 06/14/2016) 10 mL 0 Taking  . erythromycin [facility](ROMYCIN) ophthalmic ointment Place 1 application into the left eye 4 (four) times daily. Use up to every hour until relief. (Patient not taking: Reported on 06/14/2016) 3.5 g 0 Taking   Results for orders placed or performed during the hospital encounter of 06/14/16 (from the past 48 hour(s))  Urinalysis, Routine w reflex microscopic (not at [facility])     Status: Abnormal   Collection Time: 06/14/16  8:49 PM  Result Value Ref Range   Color, Urine YELLOW YELLOW   APPearance CLEAR  CLEAR   Specific Gravity, Urine <1.005 (L) 1.005 - 1.030   pH 5.5 5.0 - 8.0   Glucose, UA NEGATIVE NEGATIVE mg/dL   Hgb urine dipstick NEGATIVE NEGATIVE   Bilirubin Urine NEGATIVE NEGATIVE   Ketones, ur NEGATIVE NEGATIVE mg/dL   Protein, ur NEGATIVE NEGATIVE mg/dL   Nitrite NEGATIVE NEGATIVE   Leukocytes, UA TRACE (A) NEGATIVE  Urine microscopic-add on     Status: Abnormal   Collection Time: 06/14/16  8:49 PM  Result Value Ref Range   Squamous Epithelial / LPF 0-5 (A) NONE SEEN   WBC, UA 0-5 0 - 5 WBC/hpf   RBC / HPF 0-5 0 - 5 RBC/hpf   Bacteria, UA FEW (A) NONE SEEN  CBC     Status: Abnormal   Collection Time: 06/14/16 10:59 PM  Result Value Ref Range   WBC 8.4 4.0 - 10.5 K/uL   RBC 4.14 3.87 - 5.11 MIL/uL   Hemoglobin 10.3 (L) 12.0 - 15.0 g/dL   HCT 69.629.3 (L) 29.536.0 - 28.446.0 %   MCV 70.8 (L) 78.0 - 100.0 fL   MCH 24.9 (L)  26.0 - 34.0 pg   MCHC 35.2 30.0 - 36.0 g/dL   RDW 13.217.6 (H) 44.011.5 - 10.215.5 %   Platelets 268 150 - 400 K/uL    Review of Systems  Constitutional: Positive for chills. Negative for fever.  Gastrointestinal: Positive for heartburn, nausea, vomiting and abdominal pain. Negative for diarrhea and constipation.  Genitourinary: Negative for dysuria, urgency and frequency.  Neurological: Positive for dizziness (Occasional ).  Psychiatric/Behavioral: Negative for depression and suicidal ideas. The patient is nervous/anxious.    Physical Exam   Blood pressure 91/46, pulse 71, temperature 98.1 F (36.7 C), resp. rate 16, height 5\' 5"  (1.651 m), weight 144 lb 12.8 oz (65.681 kg), last menstrual period 03/31/2016, SpO2 100 %.  Physical Exam  Constitutional: She is oriented to person, place, and time. She appears well-developed and well-nourished. No distress.  Respiratory: Effort normal.  GI: Soft. Normal appearance. There is generalized tenderness. There is no rigidity, no rebound and no guarding.  Musculoskeletal: Normal range of motion.  Neurological: She is alert and oriented to person, place, and time.  Skin: Skin is warm. She is not diaphoretic.  Psychiatric: Her mood appears anxious. She expresses impulsivity. She exhibits a depressed mood. She expresses no suicidal ideation. She expresses no suicidal plans.    MAU Course  Procedures  None  MDM  + fetal heart tones via doppler.  Lr bolus X1 Vistaril IV Reglan IV Pepcid IV   GC not collected at last MAU visit; patient declined GC testing today in MAU.  PO challenge: pass   Assessment and Plan   A:  1. Nausea and vomiting during pregnancy   2. Gastroesophageal reflux disease, esophagitis presence not specified   3. Feeling anxious   4. Anemia in pregnancy, first trimester     P:  Discharge home in stable condition Rx: Iron, Zantac BID, Vistaril, Reglan Return to MAU if symptoms worsen Follow up with OB as  scheduled First trimester warning signs discussed.  Discuss anxiety with OB, patient may need counseling/Psych referral    Duane LopeJennifer I Tam Delisle, NP 06/14/2016 11:52 PM

## 2016-06-14 NOTE — Discharge Instructions (Signed)
Anemia, Nonspecific Anemia is a condition in which the concentration of red blood cells or hemoglobin in the blood is below normal. Hemoglobin is a substance in red blood cells that carries oxygen to the tissues of the body. Anemia results in not enough oxygen reaching these tissues.  CAUSES  Common causes of anemia include:   Excessive bleeding. Bleeding may be internal or external. This includes excessive bleeding from periods (in women) or from the intestine.   Poor nutrition.   Chronic kidney, thyroid, and liver disease.  Bone marrow disorders that decrease red blood cell production.  Cancer and treatments for cancer.  HIV, AIDS, and their treatments.  Spleen problems that increase red blood cell destruction.  Blood disorders.  Excess destruction of red blood cells due to infection, medicines, and autoimmune disorders. SIGNS AND SYMPTOMS   Minor weakness.   Dizziness.   Headache.  Palpitations.   Shortness of breath, especially with exercise.   Paleness.  Cold sensitivity.  Indigestion.  Nausea.  Difficulty sleeping.  Difficulty concentrating. Symptoms may occur suddenly or they may develop slowly.  DIAGNOSIS  Additional blood tests are often needed. These help your health care provider determine the best treatment. Your health care provider will check your stool for blood and look for other causes of blood loss.  TREATMENT  Treatment varies depending on the cause of the anemia. Treatment can include:   Supplements of iron, vitamin B12, or folic acid.   Hormone medicines.   A blood transfusion. This may be needed if blood loss is severe.   Hospitalization. This may be needed if there is significant continual blood loss.   Dietary changes.  Spleen removal. HOME CARE INSTRUCTIONS Keep all follow-up appointments. It often takes many weeks to correct anemia, and having your health care provider check on your condition and your response to  treatment is very important. SEEK IMMEDIATE MEDICAL CARE IF:   You develop extreme weakness, shortness of breath, or chest pain.   You become dizzy or have trouble concentrating.  You develop heavy vaginal bleeding.   You develop a rash.   You have bloody or black, tarry stools.   You faint.   You vomit up blood.   You vomit repeatedly.   You have abdominal pain.  You have a fever or persistent symptoms for more than 2-3 days.   You have a fever and your symptoms suddenly get worse.   You are dehydrated.  MAKE SURE YOU:  Understand these instructions.  Will watch your condition.  Will get help right away if you are not doing well or get worse.   This information is not intended to replace advice given to you by your health care provider. Make sure you discuss any questions you have with your health care provider.   Document Released: 01/16/2005 Document Revised: 08/11/2013 Document Reviewed: 06/04/2013 Elsevier Interactive Patient Education 2016 Elsevier Inc. Generalized Anxiety Disorder Generalized anxiety disorder (GAD) is a mental disorder. It interferes with life functions, including relationships, work, and school. GAD is different from normal anxiety, which everyone experiences at some point in their lives in response to specific life events and activities. Normal anxiety actually helps [place] prepare for and get through these life events and activities. Normal anxiety goes away after the event or activity is over.  GAD causes anxiety that is not necessarily related to specific events or activities. It also causes excess anxiety in proportion to specific events or activities. The anxiety associated with GAD is also difficult  to control. GAD can vary from mild to severe. People with severe GAD can have intense waves of anxiety with physical symptoms (panic attacks).  SYMPTOMS The anxiety and worry associated with GAD are difficult to control. This anxiety and  worry are related to many life events and activities and also occur more days than not for 6 months or longer. People with GAD also have three or more of the following symptoms (one or more in children):  Restlessness.   Fatigue.  Difficulty concentrating.   Irritability.  Muscle tension.  Difficulty sleeping or unsatisfying sleep. DIAGNOSIS GAD is diagnosed through an assessment by your health care provider. Your health care provider will ask you questions aboutyour mood,physical symptoms, and events in your life. Your health care provider may ask you about your medical history and use of alcohol or drugs, including prescription medicines. Your health care provider may also do a physical exam and blood tests. Certain medical conditions and the use of certain substances can cause symptoms similar to those associated with GAD. Your health care provider may refer you to a mental health specialist for further evaluation. TREATMENT The following therapies are usually used to treat GAD:   Medication. Antidepressant medication usually is prescribed for long-term daily control. Antianxiety medicines may be added in severe cases, especially when panic attacks occur.   Talk therapy (psychotherapy). Certain types of talk therapy can be helpful in treating GAD by providing support, education, and guidance. A form of talk therapy called cognitive behavioral therapy can teach you healthy ways to think about and react to daily life events and activities.  Stress managementtechniques. These include yoga, meditation, and exercise and can be very helpful when they are practiced regularly. A mental health specialist can help determine which treatment is best for you. Some people see improvement with one therapy. However, other people require a combination of therapies.   This information is not intended to replace advice given to you by your health care provider. Make sure you discuss any questions you  have with your health care provider.   Document Released: 04/05/2013 Document Revised: 12/30/2014 Document Reviewed: 04/05/2013 Elsevier Interactive Patient Education 2016 Elsevier Inc. Nausea and Vomiting Nausea is a sick feeling that often comes before throwing up (vomiting). Vomiting is a reflex where stomach contents come out of your mouth. Vomiting can cause severe loss of body fluids (dehydration). Children and elderly adults can become dehydrated quickly, especially if they also have diarrhea. Nausea and vomiting are symptoms of a condition or disease. It is important to find the cause of your symptoms. CAUSES   Direct irritation of the stomach lining. This irritation can result from increased acid production (gastroesophageal reflux disease), infection, food poisoning, taking certain medicines (such as nonsteroidal anti-inflammatory drugs), alcohol use, or tobacco use.  Signals from the brain.These signals could be caused by a headache, heat exposure, an inner ear disturbance, increased pressure in the brain from injury, infection, a tumor, or a concussion, pain, emotional stimulus, or metabolic problems.  An obstruction in the gastrointestinal tract (bowel obstruction).  Illnesses such as diabetes, hepatitis, gallbladder problems, appendicitis, kidney problems, cancer, sepsis, atypical symptoms of a heart attack, or eating disorders.  Medical treatments such as chemotherapy and radiation.  Receiving medicine that makes you sleep (general anesthetic) during surgery. DIAGNOSIS Your caregiver may ask for tests to be done if the problems do not improve after a few days. Tests may also be done if symptoms are severe or if the reason for the  nausea and vomiting is not clear. Tests may include:  Urine tests.  Blood tests.  Stool tests.  Cultures (to look for evidence of infection).  X-rays or other imaging studies. Test results can help your caregiver make decisions about treatment  or the need for additional tests. TREATMENT You need to stay well hydrated. Drink frequently but in small amounts.You may wish to drink water, sports drinks, clear broth, or eat frozen ice pops or gelatin dessert to help stay hydrated.When you eat, eating slowly may help prevent nausea.There are also some antinausea medicines that may help prevent nausea. HOME CARE INSTRUCTIONS   Take all medicine as directed by your caregiver.  If you do not have an appetite, do not force yourself to eat. However, you must continue to drink fluids.  If you have an appetite, eat a normal diet unless your caregiver tells you differently.  Eat a variety of complex carbohydrates (rice, wheat, potatoes, bread), lean meats, yogurt, fruits, and vegetables.  Avoid high-fat foods because they are more difficult to digest.  Drink enough water and fluids to keep your urine clear or pale yellow.  If you are dehydrated, ask your caregiver for specific rehydration instructions. Signs of dehydration may include:  Severe thirst.  Dry lips and mouth.  Dizziness.  Dark urine.  Decreasing urine frequency and amount.  Confusion.  Rapid breathing or pulse. SEEK IMMEDIATE MEDICAL CARE IF:   You have blood or brown flecks (like coffee grounds) in your vomit.  You have black or bloody stools.  You have a severe headache or stiff neck.  You are confused.  You have severe abdominal pain.  You have chest pain or trouble breathing.  You do not urinate at least once every 8 hours.  You develop cold or clammy skin.  You continue to vomit for longer than 24 to 48 hours.  You have a fever. MAKE SURE YOU:   Understand these instructions.  Will watch your condition.  Will get help right away if you are not doing well or get worse.   This information is not intended to replace advice given to you by your health care provider. Make sure you discuss any questions you have with your health care provider.    Document Released: 12/09/2005 Document Revised: 03/02/2012 Document Reviewed: 05/08/2011 Elsevier Interactive Patient Education [employer]2016 Elsevier Inc.

## 2016-06-14 NOTE — MAU Note (Signed)
Pt [place] and will help to have some family close for awhile. Her mom plans to move here when the baby is born. Pt is student at [employer] and will graduate fall 2018

## 2016-06-14 NOTE — MAU Note (Signed)
Nausea for couple wks. Worse today. Chills for couple days. Lower abd pains and hard to pee. Doesn't burn to pee but hurts in abd. Head hurting. Denies vag bleeding. Some white vag d/c with sl odor.

## 2016-06-14 NOTE — Progress Notes (Signed)
Venia CarbonJennifer Rasch NP in to discuss d/c plan with pt. Written and verbal d/c instructions given and understanding voiced.

## 2016-06-20 ENCOUNTER — Encounter (HOSPITAL_COMMUNITY): Payer: Self-pay | Admitting: Nurse Practitioner

## 2016-07-01 ENCOUNTER — Encounter (HOSPITAL_COMMUNITY): Payer: Self-pay

## 2016-07-01 ENCOUNTER — Ambulatory Visit (HOSPITAL_COMMUNITY)
Admission: RE | Admit: 2016-07-01 | Discharge: 2016-07-01 | Disposition: A | Discharge status: Home / Self Care | Payer: Medicaid Other | Source: Ambulatory Visit | Attending: Nurse Practitioner | Admitting: Nurse Practitioner

## 2016-07-01 ENCOUNTER — Other Ambulatory Visit (HOSPITAL_COMMUNITY): Payer: Self-pay | Admitting: Nurse Practitioner

## 2016-07-01 DIAGNOSIS — Z3A12 12 weeks gestation of pregnancy: Secondary | ICD-10-CM

## 2016-07-01 DIAGNOSIS — Z36 Encounter for antenatal screening of mother: Secondary | ICD-10-CM | POA: Insufficient documentation

## 2016-07-01 DIAGNOSIS — Z369 Encounter for antenatal screening, unspecified: Secondary | ICD-10-CM

## 2016-07-01 NOTE — ED Notes (Signed)
Pt reports small amt of bleeding last night after intercourse.

## 2016-07-09 ENCOUNTER — Other Ambulatory Visit (HOSPITAL_COMMUNITY): Payer: Self-pay

## 2016-07-11 ENCOUNTER — Ambulatory Visit (INDEPENDENT_AMBULATORY_CARE_PROVIDER_SITE_OTHER): Payer: Medicaid Other | Admitting: Obstetrics and Gynecology

## 2016-07-11 ENCOUNTER — Encounter: Payer: Self-pay | Admitting: Obstetrics and Gynecology

## 2016-07-11 VITALS — BP 99/66 | HR 68 | Wt 141.0 lb

## 2016-07-11 DIAGNOSIS — D582 Other hemoglobinopathies: Secondary | ICD-10-CM

## 2016-07-11 DIAGNOSIS — Z3490 Encounter for supervision of normal pregnancy, unspecified, unspecified trimester: Secondary | ICD-10-CM | POA: Insufficient documentation

## 2016-07-11 DIAGNOSIS — Z3481 Encounter for supervision of other normal pregnancy, first trimester: Secondary | ICD-10-CM

## 2016-07-11 DIAGNOSIS — Z3491 Encounter for supervision of normal pregnancy, unspecified, first trimester: Secondary | ICD-10-CM

## 2016-07-11 MED ORDER — PANTOPRAZOLE SODIUM 40 MG PO TBEC: 40.0000 mg | DELAYED_RELEASE_TABLET | Freq: Every day | ORAL | Status: DC

## 2016-07-11 MED ORDER — ALBUTEROL SULFATE HFA 108 (90 BASE) MCG/ACT IN AERS: 2.0000 | INHALATION_SPRAY | Freq: Four times a day (QID) | RESPIRATORY_TRACT | Status: DC | PRN

## 2016-07-11 NOTE — Progress Notes (Signed)
New OB Note  07/11/2016   Clinic: [facility]  Chief Complaint: NOB  Transfer of Care Patient: no  History of Present Illness: [name] is a 25 y.o. G1P0000 @ 13/5 weeks (EDC 1/20, based on 6wk u/s, Patient's last menstrual period was 03/31/2016.), with the above CC. Preg complicated by has Menometrorrhagia; Migraine with aura; IDA (iron deficiency anemia); Vitamin D deficiency; Mood disorder (HCC); Preventative health care; Injury of elbow, right; Anemia; Noninfectious gastroenteritis and colitis; Supervision of normal pregnancy in first trimester; and Hemoglobin C trait (HCC) on her problem list.   Her periods were: regular, qmonth She was using no method when she conceived.  She has Negative signs or symptoms of nausea/vomiting of pregnancy. She has Negative signs or symptoms of miscarriage or preterm labor She identifies Negative Zika risk factors for her and her partner  ROS: A 12-point review of systems was performed and negative, except as stated in the above HPI.  OBGYN History: As per HPI. OB History  Gravida Para Term Preterm AB SAB TAB Ectopic Multiple Living  1 0 0 0 0 0 0 0 0 0     # Outcome Date GA Lbr Len/2nd Weight Sex Delivery Anes PTL Lv  1 Current               Any prior children are healthy, doing well, without any problems or issues: not applicable History of pap smears: Yes. Last pap smear age 25. Abnormal: no History of STIs: Yes   Past Medical History: Past Medical History  Diagnosis Date  . Hyperventilation   . Anemia 2012  . Migraine 2010  . Ovarian cyst 2012    Diagnosed on pelvic ultrasound. F/u Ultrasound 06/2012 revealed no cyst.   . PID (pelvic inflammatory disease) 2012    GC and chlamydia   . Asthma 2009    last used inhaler 2009  . Yeast infection   . Hx of chlamydia infection   . History of gonorrhea   . H/O varicella   . Anxiety   . Allergy   . Arthritis   . Depression     Past Surgical History: History reviewed. No pertinent  past surgical history.  Family History:  Family History  Problem Relation Age of Onset  . Hyperlipidemia Mother   . Hypertension Mother   . Kidney disease Mother   . Hypertension Father   . Hypertension Brother   . Hyperlipidemia Brother   . Cancer Maternal Aunt 58    Ovarian CA  . Diabetes Maternal Grandmother   . Diabetes Maternal Grandfather    She denies any female cancers, bleeding or blood clotting disorders, except ovarian cancer? In a cousin  She denies any history of mental retardation, birth defects or genetic disorders in her or the FOB's history  Social History:  Social History   Social History  . Marital Status: Single    Spouse Name: N/A  . Number of Children: N/A  . Years of Education: 13   Occupational History  . Student      [employer]  . grocery store clerk     Social History Main Topics  . Smoking status: Former Smoker    Start date: 09/15/2012  . Smokeless tobacco: Never Used  . Alcohol Use: No     Comment: none with pregnancy  . Drug Use: No  . Sexual Activity: Not Currently    Birth Control/ Protection: Condom   Other Topics Concern  . Not on file   Social History Narrative  Lives alone. Own apartment.    Her and FOB aren't together anymore. No issues with domestic violence   Allergy: Allergies  Allergen Reactions  . Latex Hives    Health Maintenance:  Mammogram Up to Date: not applicable  Current Outpatient Medications: PNV, iron, zantac, tums  Physical Exam:   BP 117/78 mmHg  Pulse 68  Wt 141 lb (63.957 kg)  LMP 03/31/2016 Body mass index is 23.46 kg/(m^2). Fundal height: not applicable FHTs: 145  General appearance: Well nourished, well developed female in no acute distress.  Neck:  Supple, normal appearance, and no thyromegaly  Cardiovascular: S1, S2 normal, no murmur, rub or gallop, regular rate and rhythm Respiratory:  Clear to auscultation bilateral. Normal respiratory effort Abdomen: positive bowel sounds and no  masses, hernias; diffusely non tender to palpation, non distended Breasts: breasts appear normal, no suspicious masses, no skin or nipple changes or axillary nodes, and normal inspection. Neuro/Psych:  Normal mood and affect.  Skin:  Warm and dry.  Lymphatic:  No inguinal lymphadenopathy.   Pelvic exam: is not limited by body habitus EGBUS: within normal limits, Vagina: within normal limits and with no blood in the vault, Cervix: normal appearing cervix without discharge or lesions, closed/long/high, Uterus:  enlarged, c/w 12-14 week size, and Adnexa:  normal adnexa and no mass, fullness, tenderness  Laboratory: Patient showed card showing that she has hemoglobin c trait  Imaging:  Per EPIC.   Assessment: Patient doing well  Plan: 1. Supervision of normal pregnancy in first trimester Routine care. afp nv. S/p normal 1st trimester screening - Prenatal Profile I - Culture, OB Urine - HIV antibody (with reflex) - Cystic fibrosis gene test - Chlamydia/Gonococcus/Trichomonas, NAA - US OB Detail; Future  2. Hemoglobin C trait (HCC) D/w her that FOB needs to be tested to see if it's an issue. No pregnancy issues since she just has trait qtrimester ucx  3. Exercise induced asthma Rescue INH given. Pt told that if using more than a few times per week to let us know as may need to be maintenance INH then. Also zantac not working for GERD, which can worsen asthma so PPI sent in  Problem list reviewed and updated.  Follow up in 3 weeks.  >50% of 25 min visit spent on counseling and coordination of care.     Cornelia Copa MD Attending Center for [facility])

## 2016-07-13 LAB — CHLAMYDIA/GONOCOCCUS/TRICHOMONAS, NAA
CHLAMYDIA BY NAA: NEGATIVE
GONOCOCCUS BY NAA: NEGATIVE
Trich vag by NAA: NEGATIVE

## 2016-07-14 LAB — URINE CULTURE, OB REFLEX

## 2016-07-14 LAB — CULTURE, OB URINE

## 2016-07-18 LAB — CYSTIC FIBROSIS GENE TEST

## 2016-07-18 LAB — PRENATAL PROFILE I(LABCORP)
ANTIBODY SCREEN: NEGATIVE
BASOS: 0 %
Basophils Absolute: 0 10*3/uL (ref 0.0–0.2)
EOS (ABSOLUTE): 0.1 10*3/uL (ref 0.0–0.4)
Eos: 1 %
HEMOGLOBIN: 11.4 g/dL (ref 11.1–15.9)
HEP B S AG: NEGATIVE
Hematocrit: 34.2 % (ref 34.0–46.6)
IMMATURE GRANS (ABS): 0 10*3/uL (ref 0.0–0.1)
IMMATURE GRANULOCYTES: 0 %
LYMPHS: 23 %
Lymphocytes Absolute: 1.7 10*3/uL (ref 0.7–3.1)
MCH: 25.7 pg — ABNORMAL LOW (ref 26.6–33.0)
MCHC: 33.3 g/dL (ref 31.5–35.7)
MCV: 77 fL — ABNORMAL LOW (ref 79–97)
MONOCYTES: 5 %
MONOS ABS: 0.3 10*3/uL (ref 0.1–0.9)
NEUTROS PCT: 71 %
Neutrophils Absolute: 5.3 10*3/uL (ref 1.4–7.0)
Platelets: 292 10*3/uL (ref 150–379)
RBC: 4.43 x10E6/uL (ref 3.77–5.28)
RDW: 18.1 % — ABNORMAL HIGH (ref 12.3–15.4)
RPR: NONREACTIVE
RUBELLA: 6.32 {index} (ref >0.99)
Rh Factor: POSITIVE
WBC: 7.4 10*3/uL (ref 3.4–10.8)

## 2016-07-18 LAB — HIV ANTIBODY (ROUTINE TESTING W REFLEX): HIV SCREEN 4TH GENERATION: NONREACTIVE

## 2016-07-25 ENCOUNTER — Ambulatory Visit (INDEPENDENT_AMBULATORY_CARE_PROVIDER_SITE_OTHER): Payer: Medicaid Other | Admitting: Obstetrics and Gynecology

## 2016-07-25 VITALS — BP 97/55 | HR 102 | Temp 98.0°F | Wt 140.4 lb

## 2016-07-25 DIAGNOSIS — Z3402 Encounter for supervision of normal first pregnancy, second trimester: Secondary | ICD-10-CM

## 2016-07-25 DIAGNOSIS — Z1389 Encounter for screening for other disorder: Secondary | ICD-10-CM | POA: Diagnosis not present

## 2016-07-25 DIAGNOSIS — Z331 Pregnant state, incidental: Secondary | ICD-10-CM

## 2016-07-25 DIAGNOSIS — Z3491 Encounter for supervision of normal pregnancy, unspecified, first trimester: Secondary | ICD-10-CM

## 2016-07-25 LAB — POCT URINALYSIS DIPSTICK
BILIRUBIN UA: NEGATIVE
Blood, UA: NEGATIVE
GLUCOSE UA: NEGATIVE
KETONES UA: NEGATIVE
Leukocytes, UA: NEGATIVE
Nitrite, UA: NEGATIVE
SPEC GRAV UA: 1.02
Urobilinogen, UA: 0.2
pH, UA: 6

## 2016-07-25 NOTE — Patient Instructions (Signed)
Breastfeeding Deciding to breastfeed is one of the best choices you can make for you and your baby. A change in hormones during pregnancy causes your breast tissue to grow and increases the number and size of your milk ducts. These hormones also allow proteins, sugars, and fats from your blood supply to make breast milk in your milk-producing glands. Hormones prevent breast milk from being released before your baby is born as well as prompt milk flow after birth. Once breastfeeding has begun, thoughts of your baby, as well as his or her sucking or crying, can stimulate the release of milk from your milk-producing glands.  BENEFITS OF BREASTFEEDING For Your Baby  Your first milk (colostrum) helps your baby's digestive system function better.  There are antibodies in your milk that help your baby fight off infections.  Your baby has a lower incidence of asthma, allergies, and sudden infant death syndrome.  The nutrients in breast milk are better for your baby than infant formulas and are designed uniquely for your baby's needs.  Breast milk improves your baby's brain development.  Your baby is less likely to develop other conditions, such as childhood obesity, asthma, or type 2 diabetes mellitus. For You  Breastfeeding helps to create a very special bond between you and your baby.  Breastfeeding is convenient. Breast milk is always available at the correct temperature and costs nothing.  Breastfeeding helps to burn calories and helps you lose the weight gained during pregnancy.  Breastfeeding makes your uterus contract to its prepregnancy size faster and slows bleeding (lochia) after you give birth.   Breastfeeding helps to lower your risk of developing type 2 diabetes mellitus, osteoporosis, and breast or ovarian cancer later in life. SIGNS THAT YOUR BABY IS HUNGRY Early Signs of Hunger  Increased alertness or activity.  Stretching.  Movement of the head from side to  side.  Movement of the head and opening of the mouth when the corner of the mouth or cheek is stroked (rooting).  Increased sucking sounds, smacking lips, cooing, sighing, or squeaking.  Hand-to-mouth movements.  Increased sucking of fingers or hands. Late Signs of Hunger  Fussing.  Intermittent crying. Extreme Signs of Hunger Signs of extreme hunger will require calming and consoling before your baby will be able to breastfeed successfully. Do not wait for the following signs of extreme hunger to occur before you initiate breastfeeding:  Restlessness.  A loud, strong cry.  Screaming. BREASTFEEDING BASICS Breastfeeding Initiation  Find a comfortable place to sit or lie down, with your neck and back well supported.  Place a pillow or rolled up blanket under your baby to bring him or her to the level of your breast (if you are seated). Nursing pillows are specially designed to help support your arms and your baby while you breastfeed.  Make sure that your baby's abdomen is facing your abdomen.  Gently massage your breast. With your fingertips, massage from your chest wall toward your nipple in a circular motion. This encourages milk flow. You may need to continue this action during the feeding if your milk flows slowly.  Support your breast with 4 fingers underneath and your thumb above your nipple. Make sure your fingers are well away from your nipple and your baby's mouth.  Stroke your baby's lips gently with your finger or nipple.  When your baby's mouth is open wide enough, quickly bring your baby to your breast, placing your entire nipple and as much of the colored area around your nipple (  areola) as possible into your baby's mouth.  More areola should be visible above your baby's upper lip than below the lower lip.  Your baby's tongue should be between his or her lower gum and your breast.  Ensure that your baby's mouth is correctly positioned around your nipple  (latched). Your baby's lips should create a seal on your breast and be turned out (everted).  It is common for your baby to suck about 2-3 minutes in order to start the flow of breast milk. Latching Teaching your baby how to latch on to your breast properly is very important. An improper latch can cause nipple pain and decreased milk supply for you and poor weight gain in your baby. Also, if your baby is not latched onto your nipple properly, he or she may swallow some air during feeding. This can make your baby fussy. Burping your baby when you switch breasts during the feeding can help to get rid of the air. However, teaching your baby to latch on properly is still the best way to prevent fussiness from swallowing air while breastfeeding. Signs that your baby has successfully latched on to your nipple:  Silent tugging or silent sucking, without causing you pain.  Swallowing heard between every 3-4 sucks.  Muscle movement above and in front of his or her ears while sucking. Signs that your baby has not successfully latched on to nipple:  Sucking sounds or smacking sounds from your baby while breastfeeding.  Nipple pain. If you think your baby has not latched on correctly, slip your finger into the corner of your baby's mouth to break the suction and place it between your baby's gums. Attempt breastfeeding initiation again. Signs of Successful Breastfeeding Signs from your baby:  A gradual decrease in the number of sucks or complete cessation of sucking.  Falling asleep.  Relaxation of his or her body.  Retention of a small amount of milk in his or her mouth.  Letting go of your breast by himself or herself. Signs from you:  Breasts that have increased in firmness, weight, and size 1-3 hours after feeding.  Breasts that are softer immediately after breastfeeding.  Increased milk volume, as well as a change in milk consistency and color by the fifth day of breastfeeding.  Nipples  that are not sore, cracked, or bleeding. Signs That Your Baby is Getting Enough Milk  Wetting at least 3 diapers in a 24-hour period. The urine should be clear and pale yellow by age 5 days.  At least 3 stools in a 24-hour period by age 5 days. The stool should be soft and yellow.  At least 3 stools in a 24-hour period by age 7 days. The stool should be seedy and yellow.  No loss of weight greater than 10% of birth weight during the first 3 days of age.  Average weight gain of 4-7 ounces (113-198 g) per week after age 4 days.  Consistent daily weight gain by age 5 days, without weight loss after the age of 2 weeks. After a feeding, your baby may spit up a small amount. This is common. BREASTFEEDING FREQUENCY AND DURATION Frequent feeding will help you make more milk and can prevent sore nipples and breast engorgement. Breastfeed when you feel the need to reduce the fullness of your breasts or when your baby shows signs of hunger. This is called "breastfeeding on demand." Avoid introducing a pacifier to your baby while you are working to establish breastfeeding (the first 4-6 weeks   after your baby is born). After this time you may choose to use a pacifier. Research has shown that pacifier use during the first year of a baby's life decreases the risk of sudden infant death syndrome (SIDS). Allow your baby to feed on each breast as long as he or she wants. Breastfeed until your baby is finished feeding. When your baby unlatches or falls asleep while feeding from the first breast, offer the second breast. Because newborns are often sleepy in the first few weeks of life, you may need to awaken your baby to get him or her to feed. Breastfeeding times will vary from baby to baby. However, the following rules can serve as a guide to help you ensure that your baby is properly fed:  Newborns (babies 4 weeks of age or younger) may breastfeed every 1-3 hours.  Newborns should not go longer than 3 hours  during the day or 5 hours during the night without breastfeeding.  You should breastfeed your baby a minimum of 8 times in a 24-hour period until you begin to introduce solid foods to your baby at around 6 months of age. BREAST MILK PUMPING Pumping and storing breast milk allows you to ensure that your baby is exclusively fed your breast milk, even at times when you are unable to breastfeed. This is especially important if you are going back to work while you are still breastfeeding or when you are not able to be present during feedings. Your lactation consultant can give you guidelines on how long it is safe to store breast milk. A breast pump is a machine that allows you to pump milk from your breast into a sterile bottle. The pumped breast milk can then be stored in a refrigerator or freezer. Some breast pumps are operated by hand, while others use electricity. Ask your lactation consultant which type will work best for you. Breast pumps can be purchased, but some hospitals and breastfeeding support groups lease breast pumps on a monthly basis. A lactation consultant can teach you how to hand express breast milk, if you prefer not to use a pump. CARING FOR YOUR BREASTS WHILE YOU BREASTFEED Nipples can become dry, cracked, and sore while breastfeeding. The following recommendations can help keep your breasts moisturized and healthy:  Avoid using soap on your nipples.  Wear a supportive bra. Although not required, special nursing bras and tank tops are designed to allow access to your breasts for breastfeeding without taking off your entire bra or top. Avoid wearing underwire-style bras or extremely tight bras.  Air dry your nipples for 3-4minutes after each feeding.  Use only cotton bra pads to absorb leaked breast milk. Leaking of breast milk between feedings is normal.  Use lanolin on your nipples after breastfeeding. Lanolin helps to maintain your skin's normal moisture barrier. If you use  pure lanolin, you do not need to wash it off before feeding your baby again. Pure lanolin is not toxic to your baby. You may also hand express a few drops of breast milk and gently massage that milk into your nipples and allow the milk to air dry. In the first few weeks after giving birth, some women experience extremely full breasts (engorgement). Engorgement can make your breasts feel heavy, warm, and tender to the touch. Engorgement peaks within 3-5 days after you give birth. The following recommendations can help ease engorgement:  Completely empty your breasts while breastfeeding or pumping. You may want to start by applying warm, moist heat (in   the shower or with warm water-soaked hand towels) just before feeding or pumping. This increases circulation and helps the milk flow. If your baby does not completely empty your breasts while breastfeeding, pump any extra milk after he or she is finished.  Wear a snug bra (nursing or regular) or tank top for 1-2 days to signal your body to slightly decrease milk production.  Apply ice packs to your breasts, unless this is too uncomfortable for you.  Make sure that your baby is latched on and positioned properly while breastfeeding. If engorgement persists after 48 hours of following these recommendations, contact your health care provider or a Advertising copywriter. OVERALL HEALTH CARE RECOMMENDATIONS WHILE BREASTFEEDING  Eat healthy foods. Alternate between meals and snacks, eating 3 of each per day. Because what you eat affects your breast milk, some of the foods may make your baby more irritable than usual. Avoid eating these foods if you are sure that they are negatively affecting your baby.  Drink milk, fruit juice, and water to satisfy your thirst (about 10 glasses a day).  Rest often, relax, and continue to take your prenatal vitamins to prevent fatigue, stress, and anemia.  Continue breast self-awareness checks.  Avoid chewing and smoking  tobacco. Chemicals from cigarettes that pass into breast milk and exposure to secondhand smoke may harm your baby.  Avoid alcohol and drug use, including marijuana. Some medicines that may be harmful to your baby can pass through breast milk. It is important to ask your health care provider before taking any medicine, including all over-the-counter and prescription medicine as well as vitamin and herbal supplements. It is possible to become pregnant while breastfeeding. If birth control is desired, ask your health care provider about options that will be safe for your baby. SEEK MEDICAL CARE IF:  You feel like you want to stop breastfeeding or have become frustrated with breastfeeding.  You have painful breasts or nipples.  Your nipples are cracked or bleeding.  Your breasts are red, tender, or warm.  You have a swollen area on either breast.  You have a fever or chills.  You have nausea or vomiting.  You have drainage other than breast milk from your nipples.  Your breasts do not become full before feedings by the fifth day after you give birth.  You feel sad and depressed.  Your baby is too sleepy to eat well.  Your baby is having trouble sleeping.   Your baby is wetting less than 3 diapers in a 24-hour period.  Your baby has less than 3 stools in a 24-hour period.  Your baby's skin or the white part of his or her eyes becomes yellow.   Your baby is not gaining weight by 43 days of age. SEEK IMMEDIATE MEDICAL CARE IF:  Your baby is overly tired (lethargic) and does not want to wake up and feed.  Your baby develops an unexplained fever.   This information is not intended to replace advice given to you by your health care provider. Make sure you discuss any questions you have with your health care provider.   Document Released: 12/09/2005 Document Revised: 08/30/2015 Document Reviewed: 06/02/2013 Elsevier Interactive Patient Education [employer] Contraception  Choices Contraception (birth control) is the use of any methods or devices to prevent pregnancy. Below are some methods to help avoid pregnancy. HORMONAL METHODS   Contraceptive implant. This is a thin, plastic tube containing progesterone hormone. It does not contain estrogen hormone. Your health care provider inserts  the tube in the inner part of the upper arm. The tube can remain in place for up to 3 years. After 3 years, the implant must be removed. The implant prevents the ovaries from releasing an egg (ovulation), thickens the cervical mucus to prevent sperm from entering the uterus, and thins the lining of the inside of the uterus.  Progesterone-only injections. These injections are given every 3 months by your health care provider to prevent pregnancy. This synthetic progesterone hormone stops the ovaries from releasing eggs. It also thickens cervical mucus and changes the uterine lining. This makes it harder for sperm to survive in the uterus.  Birth control pills. These pills contain estrogen and progesterone hormone. They work by preventing the ovaries from releasing eggs (ovulation). They also cause the cervical mucus to thicken, preventing the sperm from entering the uterus. Birth control pills are prescribed by a health care provider.Birth control pills can also be used to treat heavy periods.  Minipill. This type of birth control pill contains only the progesterone hormone. They are taken every day of each month and must be prescribed by your health care provider.  Birth control patch. The patch contains hormones similar to those in birth control pills. It must be changed once a week and is prescribed by a health care provider.  Vaginal ring. The ring contains hormones similar to those in birth control pills. It is left in the vagina for 3 weeks, removed for 1 week, and then a new one is put back in place. The patient must be comfortable inserting and removing the ring from the  vagina.A health care provider's prescription is necessary.  Emergency contraception. Emergency contraceptives prevent pregnancy after unprotected sexual intercourse. This pill can be taken right after sex or up to 5 days after unprotected sex. It is most effective the sooner you take the pills after having sexual intercourse. Most emergency contraceptive pills are available without a prescription. Check with your pharmacist. Do not use emergency contraception as your only form of birth control. BARRIER METHODS   Female condom. This is a thin sheath (latex or rubber) that is worn over the penis during sexual intercourse. It can be used with spermicide to increase effectiveness.  Female condom. This is a soft, loose-fitting sheath that is put into the vagina before sexual intercourse.  Diaphragm. This is a soft, latex, dome-shaped barrier that must be fitted by a health care provider. It is inserted into the vagina, along with a spermicidal jelly. It is inserted before intercourse. The diaphragm should be left in the vagina for 6 to 8 hours after intercourse.  Cervical cap. This is a round, soft, latex or plastic cup that fits over the cervix and must be fitted by a health care provider. The cap can be left in place for up to 48 hours after intercourse.  Sponge. This is a soft, circular piece of polyurethane foam. The sponge has spermicide in it. It is inserted into the vagina after wetting it and before sexual intercourse.  Spermicides. These are chemicals that kill or block sperm from entering the cervix and uterus. They come in the form of creams, jellies, suppositories, foam, or tablets. They do not require a prescription. They are inserted into the vagina with an applicator before having sexual intercourse. The process must be repeated every time you have sexual intercourse. INTRAUTERINE CONTRACEPTION  Intrauterine device (IUD). This is a T-shaped device that is put in a woman's uterus during a  menstrual period to prevent  pregnancy. There are 2 types:  Copper IUD. This type of IUD is wrapped in copper wire and is placed inside the uterus. Copper makes the uterus and fallopian tubes produce a fluid that kills sperm. It can stay in place for 10 years.  Hormone IUD. This type of IUD contains the hormone progestin (synthetic progesterone). The hormone thickens the cervical mucus and prevents sperm from entering the uterus, and it also thins the uterine lining to prevent implantation of a fertilized egg. The hormone can weaken or kill the sperm that get into the uterus. It can stay in place for 3-5 years, depending on which type of IUD is used. PERMANENT METHODS OF CONTRACEPTION  Female tubal ligation. This is when the woman's fallopian tubes are surgically sealed, tied, or blocked to prevent the egg from traveling to the uterus.  Hysteroscopic sterilization. This involves placing a small coil or insert into each fallopian tube. Your doctor uses a technique called hysteroscopy to do the procedure. The device causes scar tissue to form. This results in permanent blockage of the fallopian tubes, so the sperm cannot fertilize the egg. It takes about 3 months after the procedure for the tubes to become blocked. You must use another form of birth control for these 3 months.  Female sterilization. This is when the female has the tubes that carry sperm tied off (vasectomy).This blocks sperm from entering the vagina during sexual intercourse. After the procedure, the man can still ejaculate fluid (semen). NATURAL PLANNING METHODS  Natural family planning. This is not having sexual intercourse or using a barrier method (condom, diaphragm, cervical cap) on days the woman could become pregnant.  Calendar method. This is keeping track of the length of each menstrual cycle and identifying when you are fertile.  Ovulation method. This is avoiding sexual intercourse during ovulation.  Symptothermal method.  This is avoiding sexual intercourse during ovulation, using a thermometer and ovulation symptoms.  Post-ovulation method. This is timing sexual intercourse after you have ovulated. Regardless of which type or method of contraception you choose, it is important that you use condoms to protect against the transmission of sexually transmitted infections (STIs). Talk with your health care provider about which form of contraception is most appropriate for you.   This information is not intended to replace advice given to you by your health care provider. Make sure you discuss any questions you have with your health care provider.   Document Released: 12/09/2005 Document Revised: 12/14/2013 Document Reviewed: 06/03/2013 Elsevier Interactive Patient Education [employer]

## 2016-07-25 NOTE — Progress Notes (Signed)
Subjective:  Shelby Graves is a 25 y.o. G1P0000 at [redacted]w[redacted]d being seen today for ongoing prenatal care.  She is currently monitored for the following issues for this low-risk pregnancy and has Menometrorrhagia; Migraine with aura; IDA (iron deficiency anemia); Vitamin D deficiency; Mood disorder (HCC); Preventative health care; Injury of elbow, right; Anemia; Noninfectious gastroenteritis and colitis; Supervision of normal pregnancy in first trimester; and Hemoglobin C trait (HCC) on her problem list.  Patient reports no complaints.  Contractions: Not present. Vag. Bleeding: None.   . Denies leaking of fluid.   The following portions of the patient's history were reviewed and updated as appropriate: allergies, current medications, past family history, past medical history, past social history, past surgical history and problem list. Problem list updated.  Objective:   Vitals:   07/25/16 1108  BP: (!) 97/55  Pulse: (!) 102  Temp: 98 F (36.7 C)  Weight: 140 lb 6.4 oz (63.7 kg)    Fetal Status: Fetal Heart Rate (bpm): 160         General:  Alert, oriented and cooperative. Patient is in no acute distress.  Skin: Skin is warm and dry. No rash noted.   Cardiovascular: Normal heart rate noted  Respiratory: Normal respiratory effort, no problems with respiration noted  Abdomen: Soft, gravid, appropriate for gestational age. Pain/Pressure: Absent     Pelvic:  Cervical exam deferred        Extremities: Normal range of motion.  Edema: None  Mental Status: Normal mood and affect. Normal behavior. Normal judgment and thought content.   Urinalysis:      Assessment and Plan:  Pregnancy: G1P0000 at [redacted]w[redacted]d  1. Encounter for supervision of normal first pregnancy in second trimester - POCT Urinalysis Dipstick  2. Supervision of normal pregnancy in first trimester Patient is doing well  AFP next visit Anatomy ultrasound scheduled for 8/24 Information on breastfeeding and contraception provided  as patient is undecided  General obstetric precautions including but not limited to vaginal bleeding, contractions, leaking of fluid and fetal movement were reviewed in detail with the patient. Please refer to After Visit Summary for other counseling recommendations.  Return in about 4 weeks (around 08/22/2016).   Catalina Antigua, MD

## 2016-08-15 ENCOUNTER — Telehealth: Payer: Self-pay | Admitting: *Deleted

## 2016-08-15 ENCOUNTER — Other Ambulatory Visit: Payer: Self-pay | Admitting: Obstetrics and Gynecology

## 2016-08-15 ENCOUNTER — Ambulatory Visit (INDEPENDENT_AMBULATORY_CARE_PROVIDER_SITE_OTHER): Payer: Medicaid Other

## 2016-08-15 ENCOUNTER — Other Ambulatory Visit: Payer: Self-pay | Admitting: Certified Nurse Midwife

## 2016-08-15 ENCOUNTER — Ambulatory Visit (INDEPENDENT_AMBULATORY_CARE_PROVIDER_SITE_OTHER): Payer: Medicaid Other | Admitting: Obstetrics and Gynecology

## 2016-08-15 VITALS — BP 111/65 | HR 100 | Wt 144.3 lb

## 2016-08-15 DIAGNOSIS — Z36 Encounter for antenatal screening of mother: Secondary | ICD-10-CM

## 2016-08-15 DIAGNOSIS — Z3402 Encounter for supervision of normal first pregnancy, second trimester: Secondary | ICD-10-CM | POA: Diagnosis not present

## 2016-08-15 DIAGNOSIS — Z3491 Encounter for supervision of normal pregnancy, unspecified, first trimester: Secondary | ICD-10-CM

## 2016-08-15 NOTE — Telephone Encounter (Signed)
Per Boykin Reaperachelle- Please call patient and let her know that one of her measurements was off and therefore she is being referred to MFM for further evaluation. LM on VM for patient to CB.

## 2016-08-15 NOTE — Progress Notes (Signed)
Pt c/o round ligament pain and abdominal discomfort.

## 2016-08-15 NOTE — Progress Notes (Signed)
Subjective:  Shelby Graves is a 25 y.o. G1P0000 at 6940w5d being seen today for ongoing prenatal care.  She is currently monitored for the following issues for this low-risk pregnancy and has Menometrorrhagia; Migraine with aura; IDA (iron deficiency anemia); Vitamin D deficiency; Mood disorder (HCC); Preventative health care; Injury of elbow, right; Anemia; Noninfectious gastroenteritis and colitis; Supervision of normal pregnancy in first trimester; and Hemoglobin C trait (HCC) on her problem list.  Patient reports no complaints.  Contractions: Not present. Vag. Bleeding: None.  Movement: Present. Denies leaking of fluid.   The following portions of the patient's history were reviewed and updated as appropriate: allergies, current medications, past family history, past medical history, past social history, past surgical history and problem list. Problem list updated.  Objective:   Vitals:   08/15/16 1019  BP: 111/65  Pulse: 100  Weight: 144 lb 4.8 oz (65.5 kg)    Fetal Status: Fetal Heart Rate (bpm): 147   Movement: Present     General:  Alert, oriented and cooperative. Patient is in no acute distress.  Skin: Skin is warm and dry. No rash noted.   Cardiovascular: Normal heart rate noted  Respiratory: Normal respiratory effort, no problems with respiration noted  Abdomen: Soft, gravid, appropriate for gestational age. Pain/Pressure: Present     Pelvic:  Cervical exam deferred        Extremities: Normal range of motion.  Edema: Trace  Mental Status: Normal mood and affect. Normal behavior. Normal judgment and thought content.   Urinalysis:      Assessment and Plan:  Pregnancy: G1P0000 at 7140w5d  1. Encounter for supervision of normal first pregnancy in second trimester - POCT Urinalysis Dipstick - AFP, Serum, Open Spina Bifida  2. Supervision of normal pregnancy in first trimester Patient is doing well  AFP today Anatomy ultrasound today Patient is interested in water birth  and has completed the class on 08/14/16  General obstetric precautions including but not limited to vaginal bleeding, contractions, leaking of fluid and fetal movement were reviewed in detail with the patient. Please refer to After Visit Summary for other counseling recommendations.  No Follow-up on file.   Catalina AntiguaPeggy Niasia Lanphear, MD

## 2016-08-19 NOTE — Telephone Encounter (Signed)
LM on VM to CB re: lab results. 

## 2016-08-20 ENCOUNTER — Other Ambulatory Visit: Payer: Self-pay | Admitting: Obstetrics and Gynecology

## 2016-08-20 ENCOUNTER — Encounter: Payer: Self-pay | Admitting: *Deleted

## 2016-08-20 DIAGNOSIS — O283 Abnormal ultrasonic finding on antenatal screening of mother: Secondary | ICD-10-CM

## 2016-08-22 LAB — AFP, SERUM, OPEN SPINA BIFIDA
AFP MOM: 1.13
AFP Value: 60.1 ng/mL
Gest. Age on Collection Date: 18.7 weeks
MATERNAL AGE AT EDD: 23.8 a
OSBR Risk 1 IN: 10000
PDF: 0
Test Results:: NEGATIVE
Weight: 144 [lb_av]

## 2016-08-23 ENCOUNTER — Ambulatory Visit (HOSPITAL_COMMUNITY)
Admission: RE | Admit: 2016-08-23 | Discharge: 2016-08-23 | Disposition: A | Discharge status: Home / Self Care | Payer: Medicaid Other | Source: Ambulatory Visit | Attending: Certified Nurse Midwife | Admitting: Certified Nurse Midwife

## 2016-08-23 ENCOUNTER — Encounter (HOSPITAL_COMMUNITY): Payer: Self-pay

## 2016-08-23 ENCOUNTER — Other Ambulatory Visit: Payer: Self-pay | Admitting: Certified Nurse Midwife

## 2016-08-23 DIAGNOSIS — Z3A19 19 weeks gestation of pregnancy: Secondary | ICD-10-CM | POA: Insufficient documentation

## 2016-08-23 DIAGNOSIS — Z315 Encounter for genetic counseling: Secondary | ICD-10-CM | POA: Insufficient documentation

## 2016-08-23 DIAGNOSIS — O283 Abnormal ultrasonic finding on antenatal screening of mother: Secondary | ICD-10-CM | POA: Insufficient documentation

## 2016-08-23 DIAGNOSIS — Z3402 Encounter for supervision of normal first pregnancy, second trimester: Secondary | ICD-10-CM

## 2016-08-23 DIAGNOSIS — Z0373 Encounter for suspected fetal anomaly ruled out: Secondary | ICD-10-CM

## 2016-08-27 NOTE — Telephone Encounter (Signed)
Patient has been notified and she has had her appointment.

## 2016-08-29 ENCOUNTER — Other Ambulatory Visit: Payer: Self-pay | Admitting: Certified Nurse Midwife

## 2016-08-29 DIAGNOSIS — Z3491 Encounter for supervision of normal pregnancy, unspecified, first trimester: Secondary | ICD-10-CM

## 2016-08-29 DIAGNOSIS — Z3A19 19 weeks gestation of pregnancy: Secondary | ICD-10-CM | POA: Insufficient documentation

## 2016-08-29 DIAGNOSIS — Z0373 Encounter for suspected fetal anomaly ruled out: Secondary | ICD-10-CM | POA: Insufficient documentation

## 2016-08-29 NOTE — Progress Notes (Signed)
Genetic Counseling  High-Risk Gestation Note  Appointment Date:  08/23/2016 Referred By: Roe Coombs, CNM Date of Birth:  1991-02-15   Pregnancy History: G1P0000 Estimated Date of Delivery: 01/11/17 Estimated Gestational Age: [redacted]w[redacted]d Attending: Particia Nearing, MD   [name] was seen for genetic counseling because of previous possible abnormal ultrasound findings.    In summary:  Previous ultrasound in OB office questioned increased nuchal fold measurement  Ultrasound performed today within normal limits; nuchal fold measurement within normal limits  Discussed significance of prior screening for fetal aneuploidy  Low risk first trimester screening- <1 in 10,000 Down syndrome, trisomy 18/13  Offered additional screening  NIPS-declined, given that she feels comfortable with low risk first screen  Discussed option of diagnostic testing  Amniocentesis-declined given that her risk for fetal aneuploidy is lower than risk of complications  [name] had ultrasound through her OB office, and the nuchal fold measurement was questioned to be increased. Ultrasound performed today was within normal range, including nuchal fold measurement. Complete ultrasound results reported separately.     We discussed that the second trimester genetic sonogram is targeted at identifying features associated with aneuploidy.  It has evolved as a screening tool used to provide an individualized risk assessment for Down syndrome and other trisomies.  The ability of sonography to aid in the detection of aneuploidies relies on identification of both major structural anomalies and "soft markers."  The patient was counseled that the latter term refers to findings that are often normal variants and do not cause any significant medical problems.  Nonetheless, these markers have a known association with aneuploidy.    [name] previously had first trimester screening, which was within  normal range for the conditions screened, showing < 1 in 10,000 risk for Down syndrome, trisomy 18/13. We reviewed the sensitivity and specificity of this screen. We also discussed the screening option of noninvasive prenatal screening (NIPS)/prenatal cell free DNA testing. We reviewed the methodology and conditions for which it assesses. We discussed the higher sensitivity, specificity and lower false positive rate compared to first trimester screening. We also discussed the diagnostic testing option of amniocentesis to assess for fetal chromosome abnormalities. We reviewed risks, benefits, and limitations of amniocentesis. We discussed the associated 1 in 300-500 risk for complications from amniocentesis, including spontaneous pregnancy loss. Currently, the risk for fetal aneuploidy in the pregnancy is estimated to be less than the risk for complications from amniocentesis. After careful consideration, [name] declined NIPS and amniocentesis.   [name] reported no family history for herself or the father of the pregnancy of birth defects, intellectual disability, recurrent pregnancy loss, or known genetic conditions.   Review of the patient's OB medical records indicate that she has hemoglobin C trait. Father of the pregnancy's hemoglobinopathy carrier status is unknown. Hemoglobinopathies follow autosomal recessive inheritance. Carrier screening is available to the father of the pregnancy via hemoglobin electrophoresis. In the case that both parents are identified to be carriers, prenatal diagnosis would be available via amniocentesis. Additionally, hemoglobinopathies are included on the newborn screening panel.   [name] denied exposure to environmental toxins or chemical agents. She denied the use of alcohol, tobacco or street drugs. She denied significant viral illnesses during the course of her pregnancy. Her medical and surgical histories were noncontributory.   I counseled Ms.  Shelby Graves regarding the above risks and available options.  The approximate face-to-face time with the genetic counselor was 16 minutes.  Quinn PlowmanKaren Pancho Rushing, MS Certified Genetic Counselor 08/29/2016

## 2016-09-06 LAB — GLUCOSE, POCT (MANUAL RESULT ENTRY): POC GLUCOSE: 91 mg/dL (ref 70–99)

## 2016-09-19 ENCOUNTER — Other Ambulatory Visit: Payer: Self-pay | Admitting: Certified Nurse Midwife

## 2016-09-19 ENCOUNTER — Encounter: Payer: Self-pay | Admitting: Obstetrics and Gynecology

## 2016-09-19 ENCOUNTER — Ambulatory Visit (INDEPENDENT_AMBULATORY_CARE_PROVIDER_SITE_OTHER): Payer: Medicaid Other | Admitting: Obstetrics and Gynecology

## 2016-09-19 VITALS — BP 96/60 | HR 77 | Temp 98.0°F | Wt 151.3 lb

## 2016-09-19 DIAGNOSIS — Z3490 Encounter for supervision of normal pregnancy, unspecified, unspecified trimester: Secondary | ICD-10-CM | POA: Diagnosis not present

## 2016-09-19 DIAGNOSIS — D509 Iron deficiency anemia, unspecified: Secondary | ICD-10-CM

## 2016-09-19 DIAGNOSIS — D582 Other hemoglobinopathies: Secondary | ICD-10-CM

## 2016-09-19 MED ORDER — FERROUS SULFATE 325 (65 FE) MG PO TABS: 325.0000 mg | ORAL_TABLET | Freq: Every day | ORAL | 3 refills | Status: DC

## 2016-09-19 NOTE — Progress Notes (Signed)
Subjective:  Shelby Graves is a 25 y.o. G1P0000 at 3136w5d being seen today for ongoing prenatal care.  She is currently monitored for the following issues for this low-risk pregnancy and has IDA (iron deficiency anemia); Vitamin D deficiency; Mood disorder (HCC); Supervision of normal pregnancy; and Hemoglobin C trait (HCC) on her problem list.  Patient reports no complaints.  Contractions: Not present. Vag. Bleeding: None.  Movement: Present. Denies leaking of fluid.   The following portions of the patient's history were reviewed and updated as appropriate: allergies, current medications, past family history, past medical history, past social history, past surgical history and problem list. Problem list updated.  Objective:   Vitals:   09/19/16 1154  BP: 96/60  Pulse: 77  Temp: 98 F (36.7 C)  Weight: 151 lb 4.8 oz (68.6 kg)    Fetal Status:     Movement: Present     General:  Alert, oriented and cooperative. Patient is in no acute distress.  Skin: Skin is warm and dry. No rash noted.   Cardiovascular: Normal heart rate noted  Respiratory: Normal respiratory effort, no problems with respiration noted  Abdomen: Soft, gravid, appropriate for gestational age. Pain/Pressure: Present     Pelvic:  Cervical exam deferred        Extremities: Normal range of motion.  Edema: Trace  Mental Status: Normal mood and affect. Normal behavior. Normal judgment and thought content.   Urinalysis:      Assessment and Plan:  Pregnancy: G1P0000 at 9436w5d  1. Hemoglobin C trait (HCC) Unable to determine FOB status  2. IDA (iron deficiency anemia) - ferrous sulfate 325 (65 FE) MG tablet; Take 1 tablet (325 mg total) by mouth daily with breakfast.  Dispense: 60 tablet; Refill: 3  3. Supervision of normal pregnancy, unspecified trimester Decline flu vaccine  Preterm labor symptoms and general obstetric precautions including but not limited to vaginal bleeding, contractions, leaking of fluid and  fetal movement were reviewed in detail with the patient. Please refer to After Visit Summary for other counseling recommendations.  Return in about 4 weeks (around 10/17/2016) for OB visit.   Hermina StaggersMichael L Dakwon Wenberg, MD

## 2016-10-14 ENCOUNTER — Inpatient Hospital Stay (HOSPITAL_COMMUNITY)
Admission: AD | Admit: 2016-10-14 | Discharge: 2016-10-14 | Disposition: A | Discharge status: Home / Self Care | Payer: Medicaid Other | Source: Ambulatory Visit | Attending: Family Medicine | Admitting: Family Medicine

## 2016-10-14 ENCOUNTER — Encounter (HOSPITAL_COMMUNITY): Payer: Self-pay | Admitting: *Deleted

## 2016-10-14 DIAGNOSIS — R42 Dizziness and giddiness: Secondary | ICD-10-CM | POA: Diagnosis present

## 2016-10-14 DIAGNOSIS — Z3A27 27 weeks gestation of pregnancy: Secondary | ICD-10-CM | POA: Insufficient documentation

## 2016-10-14 DIAGNOSIS — O99012 Anemia complicating pregnancy, second trimester: Secondary | ICD-10-CM | POA: Diagnosis not present

## 2016-10-14 DIAGNOSIS — D509 Iron deficiency anemia, unspecified: Secondary | ICD-10-CM

## 2016-10-14 DIAGNOSIS — Z87891 Personal history of nicotine dependence: Secondary | ICD-10-CM | POA: Diagnosis not present

## 2016-10-14 LAB — GLUCOSE, CAPILLARY: Glucose-Capillary: 89 mg/dL (ref 65–99)

## 2016-10-14 LAB — URINALYSIS, ROUTINE W REFLEX MICROSCOPIC
Bilirubin Urine: NEGATIVE
Glucose, UA: NEGATIVE mg/dL
Hgb urine dipstick: NEGATIVE
KETONES UR: NEGATIVE mg/dL
LEUKOCYTES UA: NEGATIVE
NITRITE: NEGATIVE
PROTEIN: NEGATIVE mg/dL
Specific Gravity, Urine: 1.02 (ref 1.005–1.030)
pH: 6 (ref 5.0–8.0)

## 2016-10-14 LAB — CBC
HEMATOCRIT: 28.2 % — AB (ref 36.0–46.0)
HEMOGLOBIN: 10.1 g/dL — AB (ref 12.0–15.0)
MCH: 28.1 pg (ref 26.0–34.0)
MCHC: 35.8 g/dL (ref 30.0–36.0)
MCV: 78.3 fL (ref 78.0–100.0)
Platelets: 244 10*3/uL (ref 150–400)
RBC: 3.6 MIL/uL — AB (ref 3.87–5.11)
RDW: 16.2 % — ABNORMAL HIGH (ref 11.5–15.5)
WBC: 8.9 10*3/uL (ref 4.0–10.5)

## 2016-10-14 MED ORDER — MECLIZINE HCL 25 MG PO TABS: 25.0000 mg | ORAL_TABLET | Freq: Three times a day (TID) | ORAL | 0 refills | Status: DC | PRN

## 2016-10-14 MED ORDER — FERROUS SULFATE 325 (65 FE) MG PO TABS: 325.0000 mg | ORAL_TABLET | Freq: Two times a day (BID) | ORAL | 3 refills | Status: DC

## 2016-10-14 MED ORDER — MECLIZINE HCL 25 MG PO TABS
50.0000 mg | ORAL_TABLET | Freq: Once | ORAL | Status: AC
  Administered 2016-10-14: 50 mg via ORAL
  Filled 2016-10-14: qty 2

## 2016-10-14 NOTE — Discharge Instructions (Signed)
Dizziness Dizziness is a common problem. It is a feeling of unsteadiness or light-headedness. You may feel like you are about to faint. Dizziness can lead to injury if you stumble or fall. Anyone can become dizzy, but dizziness is more common in older adults. This condition can be caused by a number of things, including medicines, dehydration, or illness. HOME CARE INSTRUCTIONS Taking these steps may help with your condition: Eating and Drinking  Drink enough fluid to keep your urine clear or pale yellow. This helps to keep you from becoming dehydrated. Try to drink more clear fluids, such as water.  Do not drink alcohol.  Limit your caffeine intake if directed by your health care provider.  Limit your salt intake if directed by your health care provider. Activity  Avoid making quick movements.  Rise slowly from chairs and steady yourself until you feel okay.  In the morning, first sit up on the side of the bed. When you feel okay, stand slowly while you hold onto something until you know that your balance is fine.  Move your legs often if you need to stand in one place for a long time. Tighten and relax your muscles in your legs while you are standing.  Do not drive or operate heavy machinery if you feel dizzy.  Avoid bending down if you feel dizzy. Place items in your home so that they are easy for you to reach without leaning over. Lifestyle  Do not use any tobacco products, including cigarettes, chewing tobacco, or electronic cigarettes. If you need help quitting, ask your health care provider.  Try to reduce your stress level, such as with yoga or meditation. Talk with your health care provider if you need help. General Instructions  Watch your dizziness for any changes.  Take medicines only as directed by your health care provider. Talk with your health care provider if you think that your dizziness is caused by a medicine that you are taking.  Tell a friend or a family  member that you are feeling dizzy. If he or she notices any changes in your behavior, have this person call your health care provider.  Keep all follow-up visits as directed by your health care provider. This is important. SEEK MEDICAL CARE IF:  Your dizziness does not go away.  Your dizziness or light-headedness gets worse.  You feel nauseous.  You have reduced hearing.  You have new symptoms.  You are unsteady on your feet or you feel like the room is spinning. SEEK IMMEDIATE MEDICAL CARE IF:  You vomit or have diarrhea and are unable to eat or drink anything.  You have problems talking, walking, swallowing, or using your arms, hands, or legs.  You feel generally weak.  You are not thinking clearly or you have trouble forming sentences. It may take a friend or family member to notice this.  You have chest pain, abdominal pain, shortness of breath, or sweating.  Your vision changes.  You notice any bleeding.  You have a headache.  You have neck pain or a stiff neck.  You have a fever.   This information is not intended to replace advice given to you by your health care provider. Make sure you discuss any questions you have with your health care provider.   Document Released: 06/04/2001 Document Revised: 04/25/2015 Document Reviewed: 12/05/2014 Elsevier Interactive Patient Education 2016 Else[employer] Inc[employer]     Anemia, Nonspecific Anemia is a condition in which the concentration of red blood cells or  hemoglobin in the blood is below normal. Hemoglobin is a substance in red blood cells that carries oxygen to the tissues of the body. Anemia results in not enough oxygen reaching these tissues.  CAUSES  Common causes of anemia include:   Excessive bleeding. Bleeding may be internal or external. This includes excessive bleeding from periods (in women) or from the intestine.   Poor nutrition.   Chronic kidney, thyroid, and liver disease.  Bone marrow disorders that  decrease red blood cell production.  Cancer and treatments for cancer.  HIV, AIDS, and their treatments.  Spleen problems that increase red blood cell destruction.  Blood disorders.  Excess destruction of red blood cells due to infection, medicines, and autoimmune disorders. SIGNS AND SYMPTOMS   Minor weakness.   Dizziness.   Headache.  Palpitations.   Shortness of breath, especially with exercise.   Paleness.  Cold sensitivity.  Indigestion.  Nausea.  Difficulty sleeping.  Difficulty concentrating. Symptoms may occur suddenly or they may develop slowly.  DIAGNOSIS  Additional blood tests are often needed. These help your health care provider determine the best treatment. Your health care provider will check your stool for blood and look for other causes of blood loss.  TREATMENT  Treatment varies depending on the cause of the anemia. Treatment can include:   Supplements of iron, vitamin B12, or folic acid.   Hormone medicines.   A blood transfusion. This may be needed if blood loss is severe.   Hospitalization. This may be needed if there is significant continual blood loss.   Dietary changes.  Spleen removal. HOME CARE INSTRUCTIONS Keep all follow-up appointments. It often takes many weeks to correct anemia, and having your health care provider check on your condition and your response to treatment is very important. SEEK IMMEDIATE MEDICAL CARE IF:   You develop extreme weakness, shortness of breath, or chest pain.   You become dizzy or have trouble concentrating.  You develop heavy vaginal bleeding.   You develop a rash.   You have bloody or black, tarry stools.   You faint.   You vomit up blood.   You vomit repeatedly.   You have abdominal pain.  You have a fever or persistent symptoms for more than 2-3 days.   You have a fever and your symptoms suddenly get worse.   You are dehydrated.  MAKE SURE YOU:  Understand  these instructions.  Will watch your condition.  Will get help right away if you are not doing well or get worse.   This information is not intended to replace advice given to you by your health care provider. Make sure you discuss any questions you have with your health care provider.   Document Released: 01/16/2005 Document Revised: 08/11/2013 Document Reviewed: 06/04/2013 Elsevier Interactive Patient Education [employer]

## 2016-10-14 NOTE — MAU Note (Signed)
Pt reports she had an episode of dizziness tonight. States she felt like she was going to "black out". States this has been happening off/on for a week.. Pt also reports abd pain for a few days but has been constipated.

## 2016-10-14 NOTE — MAU Provider Note (Signed)
History     CSN: [id number]  Arrival date and time: 10/14/16 [id number]   First Provider Initiated Contact with Patient 10/14/16 2010      Chief Complaint  Patient presents with  . Dizziness   Shelby Graves is a 25 y.o. G1P0000 at 7928w2d who presents with dizziness. Symptoms for the last 1.5 wk. Reports daily episodes of dizziness that feel like the room is spinning. Occurs several times per day and lasts for a few minutes at a time. States she thinks she passed out for a few seconds during one of these episodes (was in seated position) and feels like she's about to pass out whenever they occur. Has headache twice in the last week; not necessarily associated with the dizziness. Denies chest pain, n/v, contractions, LOF, vaginal bleeding, palpitations. Endorses occasional SOB on exertion. Nothing makes symptoms better or worse. Tonight episode occurred just prior to arrival. Pt was at birthing class when she felt dizzy & presyncopal so came to MAU.  Positive fetal movement.     OB History as of 09/19/16    Gravida Para Term Preterm AB Living   1 0 0 0 0 0   SAB TAB Ectopic Multiple Live Births   0 0 0 0        Past Medical History:  Diagnosis Date  . Anemia 2012  . Anxiety   . Arthritis   . Asthma 2009   last used inhaler 2009  . Depression   . H/O varicella   . History of gonorrhea   . Hx of chlamydia infection   . Hyperventilation   . Migraine 2010  . PID (pelvic inflammatory disease) 2012   GC and chlamydia     History reviewed. No pertinent surgical history.  Family History  Problem Relation Age of Onset  . Hyperlipidemia Mother   . Hypertension Mother   . Kidney disease Mother   . Hypertension Father   . Hypertension Brother   . Hyperlipidemia Brother   . Diabetes Maternal Grandmother   . Diabetes Maternal Grandfather   . Cancer Maternal Aunt [id number]    Ovarian CA    Social History  Substance Use Topics  . Smoking status: Former Smoker    Start date:  09/15/2012  . Smokeless tobacco: Never Used  . Alcohol use No     Comment: none with pregnancy    Allergies:  Allergies  Allergen Reactions  . Latex Hives    Prescriptions Prior to Admission  Medication Sig Dispense Refill Last Dose  . albuterol (PROVENTIL HFA;VENTOLIN HFA) 108 (90 Base) MCG/ACT inhaler Inhale 2 puffs into the lungs every 6 (six) hours as needed for wheezing or shortness of breath. 1 Inhaler 1 Taking  . ferrous sulfate 325 (65 FE) MG tablet Take 1 tablet (325 mg total) by mouth daily with breakfast. 60 tablet 3   . pantoprazole (PROTONIX) 40 MG tablet Take 1 tablet (40 mg total) by mouth daily. 30 tablet 6 Taking  . Prenatal Vit-Fe Fumarate-FA (PRENATAL MULTIVITAMIN) TABS tablet Take 1 tablet by mouth daily at 12 noon.   Taking    Review of Systems  Constitutional: Negative.   HENT: Positive for tinnitus (right ear during dizzy episodes).   Respiratory: Positive for shortness of breath (on exertion).   Cardiovascular: Negative.   Gastrointestinal: Positive for abdominal pain and constipation. Negative for diarrhea, nausea and vomiting.  Genitourinary: Negative.   Neurological: Positive for dizziness, loss of consciousness (once last week) and headaches.   Physical Exam  Blood pressure 103/67, pulse 89, temperature 98.7 F (37.1 C), temperature source Oral, resp. rate 16, height 5\' 5"  (1.651 m), weight 157 lb (71.2 kg), last menstrual period 03/31/2016, SpO2 100 %, unknown if currently breastfeeding.   Physical Exam  Nursing note and vitals reviewed. Constitutional: She is oriented to person, place, and time. She appears well-developed and well-nourished. No distress.  HENT:  Head: Normocephalic and atraumatic.  Eyes: Conjunctivae are normal. Right eye exhibits no discharge. Left eye exhibits no discharge. No scleral icterus.  Neck: Normal range of motion.  Cardiovascular: Normal rate, regular rhythm and normal heart sounds.   No murmur heard. Respiratory:  Effort normal and breath sounds normal. No respiratory distress. She has no wheezes.  GI: Soft. There is no tenderness.  Neurological: She is alert and oriented to person, place, and time.  Skin: Skin is warm and dry. She is not diaphoretic.  Psychiatric: She has a normal mood and affect. Her behavior is normal. Judgment and thought content normal.   Fetal Tracing:  Baseline: 150 Variability: moderate Accelerations: 10x10 Decelerations: variables  Toco: x1, 40 sec   MAU Course  Procedures Results for orders placed or performed during the hospital encounter of 10/14/16 (from the past 24 hour(s))  Urinalysis, Routine w reflex microscopic (not at [facility])     Status: None   Collection Time: 10/14/16  7:46 PM  Result Value Ref Range   Color, Urine YELLOW YELLOW   APPearance CLEAR CLEAR   Specific Gravity, Urine 1.020 1.005 - 1.030   pH 6.0 5.0 - 8.0   Glucose, UA NEGATIVE NEGATIVE mg/dL   Hgb urine dipstick NEGATIVE NEGATIVE   Bilirubin Urine NEGATIVE NEGATIVE   Ketones, ur NEGATIVE NEGATIVE mg/dL   Protein, ur NEGATIVE NEGATIVE mg/dL   Nitrite NEGATIVE NEGATIVE   Leukocytes, UA NEGATIVE NEGATIVE  CBC     Status: Abnormal   Collection Time: 10/14/16  8:20 PM  Result Value Ref Range   WBC 8.9 4.0 - 10.5 K/uL   RBC 3.60 (L) 3.87 - 5.11 MIL/uL   Hemoglobin 10.1 (L) 12.0 - 15.0 g/dL   HCT 16.1 (L) 09.6 - 04.5 %   MCV 78.3 78.0 - 100.0 fL   MCH 28.1 26.0 - 34.0 pg   MCHC 35.8 30.0 - 36.0 g/dL   RDW 40.9 (H) 81.1 - 91.4 %   Platelets 244 150 - 400 K/uL  Glucose, capillary     Status: None   Collection Time: 10/14/16  9:03 PM  Result Value Ref Range   Glucose-Capillary 89 65 - 99 mg/dL    MDM CBC, u/a, EKG, orthostatics Orthostatic VS normal Meclizine 50 mg PO CBG 89 EKG normal sinus rhythm VSS & pt able to ambulate without assistance Hemoglobin 10.1; discussed increasing her oral iron dose Assessment and Plan  A: 1. Dizziness   2. IDA (iron deficiency anemia)     P: Discharge home Rx meclizine Increase ferrous sulfate to BID Increase water intake & eat every 2-3 hrs to maintain blood sugar Discussed reasons to return to MAU Keep f/u with OB   Judeth Horn 10/14/2016, 8:09 PM

## 2016-10-17 ENCOUNTER — Encounter: Payer: Self-pay | Admitting: Obstetrics and Gynecology

## 2016-10-17 ENCOUNTER — Ambulatory Visit (INDEPENDENT_AMBULATORY_CARE_PROVIDER_SITE_OTHER): Payer: Medicaid Other | Admitting: Obstetrics and Gynecology

## 2016-10-17 VITALS — BP 102/68 | HR 91 | Temp 98.3°F | Wt 154.8 lb

## 2016-10-17 DIAGNOSIS — Z3400 Encounter for supervision of normal first pregnancy, unspecified trimester: Secondary | ICD-10-CM

## 2016-10-17 DIAGNOSIS — O99012 Anemia complicating pregnancy, second trimester: Secondary | ICD-10-CM

## 2016-10-17 DIAGNOSIS — D509 Iron deficiency anemia, unspecified: Secondary | ICD-10-CM

## 2016-10-17 DIAGNOSIS — D582 Other hemoglobinopathies: Secondary | ICD-10-CM

## 2016-10-17 NOTE — Patient Instructions (Signed)

## 2016-10-17 NOTE — Progress Notes (Signed)
Patient is in the office and states that she previously was having fainting spells and was advised to increase iron, patient reports good fetal movement.

## 2016-10-17 NOTE — Progress Notes (Signed)
Subjective:  Shelby Graves is a 25 y.o. G1P0000 at 1067w5d being seen today for ongoing prenatal care.  She is currently monitored for the following issues for this low-risk pregnancy and has IDA (iron deficiency anemia); Vitamin D deficiency; Mood disorder (HCC); Supervision of normal pregnancy; and Hemoglobin C trait (HCC) on her problem list.  Patient reports no complaints.  Contractions: Not present. Vag. Bleeding: None.  Movement: Present. Denies leaking of fluid.   The following portions of the patient's history were reviewed and updated as appropriate: allergies, current medications, past family history, past medical history, past social history, past surgical history and problem list. Problem list updated.  Objective:   Vitals:   10/17/16 1344  BP: 102/68  Pulse: 91  Temp: 98.3 F (36.8 C)  Weight: 154 lb 12.8 oz (70.2 kg)    Fetal Status: Fetal Heart Rate (bpm): 153   Movement: Present     General:  Alert, oriented and cooperative. Patient is in no acute distress.  Skin: Skin is warm and dry. No rash noted.   Cardiovascular: Normal heart rate noted  Respiratory: Normal respiratory effort, no problems with respiration noted  Abdomen: Soft, gravid, appropriate for gestational age. Pain/Pressure: Absent     Pelvic:  Cervical exam deferred        Extremities: Normal range of motion.  Edema: Trace  Mental Status: Normal mood and affect. Normal behavior. Normal judgment and thought content.   Urinalysis:      Assessment and Plan:  Pregnancy: G1P0000 at 9267w5d  1. Supervision of normal first pregnancy, antepartum Unable to have 28 wk labs today Will schedule for next visit Declines flu vaccine 2. Hemoglobin C trait (HCC)   3. Iron deficiency anemia, unspecified iron deficiency anemia type Continue with iron supplement and PNV  Preterm labor symptoms and general obstetric precautions including but not limited to vaginal bleeding, contractions, leaking of fluid and fetal  movement were reviewed in detail with the patient. Please refer to After Visit Summary for other counseling recommendations.  Return in about 2 weeks (around 10/31/2016).   Hermina StaggersMichael L Terrina Docter, MD

## 2016-10-31 ENCOUNTER — Other Ambulatory Visit: Payer: Self-pay

## 2016-11-07 ENCOUNTER — Ambulatory Visit (INDEPENDENT_AMBULATORY_CARE_PROVIDER_SITE_OTHER): Payer: Medicaid Other | Admitting: Certified Nurse Midwife

## 2016-11-07 ENCOUNTER — Encounter: Payer: Self-pay | Admitting: *Deleted

## 2016-11-07 ENCOUNTER — Other Ambulatory Visit: Payer: Medicaid Other

## 2016-11-07 VITALS — BP 96/64 | HR 94 | Wt 156.0 lb

## 2016-11-07 DIAGNOSIS — Z3493 Encounter for supervision of normal pregnancy, unspecified, third trimester: Secondary | ICD-10-CM

## 2016-11-07 DIAGNOSIS — L739 Follicular disorder, unspecified: Secondary | ICD-10-CM

## 2016-11-07 NOTE — Progress Notes (Signed)
Subjective:    Shelby Graves is a 25 y.o. female being seen today for her obstetrical visit. She is at 6984w5d gestation. Patient reports no bleeding, no contractions, no cramping, no leaking and vaginal irritation. Fetal movement: normal.  No hx of HSV.  One partner.    Problem List Items Addressed This Visit    None     Patient Active Problem List   Diagnosis Date Noted  . Supervision of normal pregnancy 07/11/2016  . 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 Mood disorder (HCC) 12/02/2012  . IDA (iron deficiency anemia) 07/16/2012  . Vitamin D deficiency 07/16/2012   Objective:    LMP 03/31/2016  FHT:  142 BPM  Uterine Size: 30 cm and size equals dates  Presentation: cephalic     Assessment:    Pregnancy @ 7984w5d weeks   folliculitis of labia: HSV culture done  Plan:    2 hour OGTT today   labs reviewed, problem list updated Consent signed. GBS planning TDAP offered  Rhogam given for RH negative Pediatrician: discussed. Infant feeding: plans to breastfeed. Maternity leave: discussed. Cigarette smoking: never smoked. No orders of the defined types were placed in this encounter.  No orders of the defined types were placed in this encounter.  Follow up in 2 Weeks.

## 2016-11-08 LAB — CBC
Hematocrit: 32.2 % — ABNORMAL LOW (ref 34.0–46.6)
Hemoglobin: 10.8 g/dL — ABNORMAL LOW (ref 11.1–15.9)
MCH: 27.6 pg (ref 26.6–33.0)
MCHC: 33.5 g/dL (ref 31.5–35.7)
MCV: 82 fL (ref 79–97)
PLATELETS: 214 10*3/uL (ref 150–379)
RBC: 3.92 x10E6/uL (ref 3.77–5.28)
RDW: 16.4 % — AB (ref 12.3–15.4)
WBC: 6.5 10*3/uL (ref 3.4–10.8)

## 2016-11-08 LAB — GLUCOSE TOLERANCE, 2 HOURS W/ 1HR
GLUCOSE, 1 HOUR: 91 mg/dL (ref 65–179)
GLUCOSE, FASTING: 68 mg/dL (ref 65–91)
Glucose, 2 hour: 77 mg/dL (ref 65–152)

## 2016-11-08 LAB — HIV ANTIBODY (ROUTINE TESTING W REFLEX): HIV SCREEN 4TH GENERATION: NONREACTIVE

## 2016-11-08 LAB — RPR: RPR Ser Ql: NONREACTIVE

## 2016-11-10 LAB — HERPES SIMPLEX VIRUS CULTURE

## 2016-11-11 ENCOUNTER — Other Ambulatory Visit: Payer: Self-pay | Admitting: Certified Nurse Midwife

## 2016-11-11 ENCOUNTER — Telehealth: Payer: Self-pay | Admitting: *Deleted

## 2016-11-11 DIAGNOSIS — Z3403 Encounter for supervision of normal first pregnancy, third trimester: Secondary | ICD-10-CM

## 2016-11-11 MED ORDER — OB COMPLETE PETITE 35-5-1-200 MG PO CAPS: 1.0000 | ORAL_CAPSULE | Freq: Every day | ORAL | 12 refills | Status: DC

## 2016-11-11 NOTE — Telephone Encounter (Signed)
Patient wants a prenatal sent to the pharmacy

## 2016-11-11 NOTE — Telephone Encounter (Signed)
Please let her know that OB complete petite has been sent to the pharmacy.  Thank you.  R.Gavina Dildine CNM

## 2016-11-12 ENCOUNTER — Other Ambulatory Visit: Payer: Self-pay | Admitting: Certified Nurse Midwife

## 2016-11-12 DIAGNOSIS — Z3403 Encounter for supervision of normal first pregnancy, third trimester: Secondary | ICD-10-CM

## 2016-11-21 ENCOUNTER — Encounter: Payer: Self-pay | Admitting: *Deleted

## 2016-11-21 ENCOUNTER — Ambulatory Visit (INDEPENDENT_AMBULATORY_CARE_PROVIDER_SITE_OTHER): Payer: Medicaid Other | Admitting: Certified Nurse Midwife

## 2016-11-21 ENCOUNTER — Encounter: Payer: Self-pay | Admitting: Certified Nurse Midwife

## 2016-11-21 VITALS — BP 126/73 | HR 110 | Wt 159.0 lb

## 2016-11-21 DIAGNOSIS — Z3403 Encounter for supervision of normal first pregnancy, third trimester: Secondary | ICD-10-CM

## 2016-11-21 DIAGNOSIS — E559 Vitamin D deficiency, unspecified: Secondary | ICD-10-CM

## 2016-11-21 DIAGNOSIS — D582 Other hemoglobinopathies: Secondary | ICD-10-CM

## 2016-11-21 DIAGNOSIS — D509 Iron deficiency anemia, unspecified: Secondary | ICD-10-CM

## 2016-11-21 DIAGNOSIS — F39 Unspecified mood [affective] disorder: Secondary | ICD-10-CM

## 2016-11-21 NOTE — Progress Notes (Signed)
Subjective:    Shelby Graves is a 25 y.o. female being seen today for her obstetrical visit. She is at 4073w5d gestation. Patient reports headache, no bleeding, no contractions, no cramping and no leaking. Will try OTC Tylenol for HA, comes and goes.  Fetal movement: normal.  Problem List Items Addressed This Visit      Other   IDA (iron deficiency anemia) - Primary   Vitamin D deficiency   Mood disorder (HCC)   Supervision of normal pregnancy   Hemoglobin C trait (HCC)     Patient Active Problem List   Diagnosis Date Noted  . Supervision of normal pregnancy 07/11/2016  . 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 Mood disorder (HCC) 12/02/2012  . IDA (iron deficiency anemia) 07/16/2012  . Vitamin D deficiency 07/16/2012   Objective:    LMP 03/31/2016  FHT:  144 BPM  Uterine Size: 33 cm and size equals dates  Presentation: cephalic     Assessment:    Pregnancy @ 1873w5d weeks   Normal discomforts of pregnancy  Plan:    Waterbirth planned.  Certificate on file.    labs reviewed, problem list updated Consent signed for waterbirth @[facility] . GBS planning TDAP offered  Rhogam given for RH negative Pediatrician: discussed. Infant feeding: plans to breastfeed. Maternity leave: discussed. Cigarette smoking: never smoked. No orders of the defined types were placed in this encounter.  No orders of the defined types were placed in this encounter.  Follow up in 2 Weeks.

## 2016-12-05 ENCOUNTER — Ambulatory Visit (INDEPENDENT_AMBULATORY_CARE_PROVIDER_SITE_OTHER): Payer: Medicaid Other | Admitting: Certified Nurse Midwife

## 2016-12-05 VITALS — BP 93/66 | HR 87 | Wt 166.0 lb

## 2016-12-05 DIAGNOSIS — Z3403 Encounter for supervision of normal first pregnancy, third trimester: Secondary | ICD-10-CM

## 2016-12-05 DIAGNOSIS — F39 Unspecified mood [affective] disorder: Secondary | ICD-10-CM

## 2016-12-05 DIAGNOSIS — D509 Iron deficiency anemia, unspecified: Secondary | ICD-10-CM

## 2016-12-05 DIAGNOSIS — E559 Vitamin D deficiency, unspecified: Secondary | ICD-10-CM

## 2016-12-05 DIAGNOSIS — O4703 False labor before 37 completed weeks of gestation, third trimester: Secondary | ICD-10-CM

## 2016-12-05 DIAGNOSIS — D582 Other hemoglobinopathies: Secondary | ICD-10-CM

## 2016-12-05 NOTE — Progress Notes (Signed)
Subjective:    Karl Pockabitha Mannor is a 25 y.o. female being seen today for her obstetrical visit. She is at 2475w5d gestation. Patient reports backache, no bleeding, occasional contractions and vaginal discharge. Fetal movement: normal.  Problem List Items Addressed This Visit      Other   IDA (iron deficiency anemia)   Vitamin D deficiency   Mood disorder (HCC)   Supervision of normal pregnancy   Relevant Orders   Culture, beta strep (group b only)   NuSwab Vaginitis Plus (VG+)   Hemoglobin C trait (HCC) - Primary     Patient Active Problem List   Diagnosis Date Noted  . Supervision of normal pregnancy 07/11/2016  . 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 Mood disorder (HCC) 12/02/2012  . IDA (iron deficiency anemia) 07/16/2012  . Vitamin D deficiency 07/16/2012   Objective:    BP 93/66   Pulse 87   Wt 166 lb (75.3 kg)   LMP 03/31/2016   BMI 27.62 kg/m  FHT:  145 BPM  Uterine Size: 35 cm and size equals dates  Presentation: cephalic   + Nitrazine,  Negative fern.  Cervix outer os 1 cm, inner os closed, anterior, 25%effaced, -3 station, vertex  Assessment:    Pregnancy @ 8875w5d weeks   Leukorrhea of pregnancy  Braxton hicks contractions  Waterbirth planned  Plan:     labs reviewed, problem list updated Consent signed. GBS sent TDAP offered  Rhogam given for RH negative Pediatrician: discussed. Infant feeding: plans to breastfeed. Maternity leave: discussed. Cigarette smoking: never smoked. Orders Placed This Encounter  Procedures  . Culture, beta strep (group b only)  . NuSwab Vaginitis Plus (VG+)   No orders of the defined types were placed in this encounter.  Follow up in 1 Week.

## 2016-12-05 NOTE — Progress Notes (Signed)
Pt state increase in pelvic pain,pressure and d/c.

## 2016-12-09 LAB — CULTURE, BETA STREP (GROUP B ONLY): STREP GP B CULTURE: NEGATIVE

## 2016-12-12 ENCOUNTER — Other Ambulatory Visit: Payer: Self-pay | Admitting: Certified Nurse Midwife

## 2016-12-12 DIAGNOSIS — B373 Candidiasis of vulva and vagina: Secondary | ICD-10-CM

## 2016-12-12 DIAGNOSIS — B3731 Acute candidiasis of vulva and vagina: Secondary | ICD-10-CM

## 2016-12-12 LAB — NUSWAB VAGINITIS PLUS (VG+)
Candida albicans, NAA: POSITIVE — AB
Candida glabrata, NAA: NEGATIVE
Chlamydia trachomatis, NAA: NEGATIVE
Neisseria gonorrhoeae, NAA: NEGATIVE
Trich vag by NAA: NEGATIVE

## 2016-12-12 MED ORDER — TERCONAZOLE 0.8 % VA CREA: 1.0000 | TOPICAL_CREAM | Freq: Every day | VAGINAL | 0 refills | Status: DC

## 2016-12-12 MED ORDER — FLUCONAZOLE 150 MG PO TABS: 150.0000 mg | ORAL_TABLET | Freq: Once | ORAL | 0 refills | Status: AC

## 2016-12-17 ENCOUNTER — Ambulatory Visit (INDEPENDENT_AMBULATORY_CARE_PROVIDER_SITE_OTHER): Payer: Medicaid Other | Admitting: Certified Nurse Midwife

## 2016-12-17 DIAGNOSIS — Z3403 Encounter for supervision of normal first pregnancy, third trimester: Secondary | ICD-10-CM

## 2016-12-17 NOTE — Progress Notes (Signed)
Subjective:    Shelby Graves is a 25 y.o. female being seen today for her obstetrical visit. She is at 11061w3d gestation. Patient reports no complaints. Fetal movement: normal.  Problem List Items Addressed This Visit      Other   Supervision of normal pregnancy     Patient Active Problem List   Diagnosis Date Noted  . Supervision of normal pregnancy 07/11/2016  . 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 Mood disorder (HCC) 12/02/2012  . IDA (iron deficiency anemia) 07/16/2012  . Vitamin D deficiency 07/16/2012   Objective:    BP 115/72   Pulse (!) 106   Wt 162 lb (73.5 kg)   LMP 03/31/2016   BMI 26.96 kg/m  FHT:  145 BPM  Uterine Size: 36 cm and size equals dates  Presentation: cephalic     Assessment:    Pregnancy @ 6661w3d weeks   Plan:    Waterbirth planned.    labs reviewed, problem list updated  GBS results reviewed.  TDAP offered   Pediatrician: discussed. Infant feeding: plans to breastfeed. Maternity leave: discussed. Cigarette smoking: never smoked. No orders of the defined types were placed in this encounter.  No orders of the defined types were placed in this encounter.  Follow up in 1 Week.

## 2016-12-17 NOTE — Patient Instructions (Addendum)
Third Trimester of Pregnancy The third trimester is from week 29 through week 40 (months 7 through 9). The third trimester is a time when the unborn baby (fetus) is growing rapidly. At the end of the ninth month, the fetus is about 20 inches in length and weighs 6-10 pounds. Body changes during your third trimester Your body goes through many changes during pregnancy. The changes vary from woman to woman. During the third trimester:  Your weight will continue to increase. You can expect to gain 25-35 pounds (11-16 kg) by the end of the pregnancy.  You may begin to get stretch marks on your hips, abdomen, and breasts.  You may urinate more often because the fetus is moving lower into your pelvis and pressing on your bladder.  You may develop or continue to have heartburn. This is caused by increased hormones that slow down muscles in the digestive tract.  You may develop or continue to have constipation because increased hormones slow digestion and cause the muscles that push waste through your intestines to relax.  You may develop hemorrhoids. These are swollen veins (varicose veins) in the rectum that can itch or be painful.  You may develop swollen, bulging veins (varicose veins) in your legs.  You may have increased body aches in the pelvis, back, or thighs. This is due to weight gain and increased hormones that are relaxing your joints.  You may have changes in your hair. These can include thickening of your hair, rapid growth, and changes in texture. Some women also have hair loss during or after pregnancy, or hair that feels dry or thin. Your hair will most likely return to normal after your baby is born.  Your breasts will continue to grow and they will continue to become tender. A yellow fluid (colostrum) may leak from your breasts. This is the first milk you are producing for your baby.  Your belly button may stick out.  You may notice more swelling in your hands, face, or  ankles.  You may have increased tingling or numbness in your hands, arms, and legs. The skin on your belly may also feel numb.  You may feel short of breath because of your expanding uterus.  You may have more problems sleeping. This can be caused by the size of your belly, increased need to urinate, and an increase in your body's metabolism.  You may notice the fetus "dropping," or moving lower in your abdomen.  You may have increased vaginal discharge.  Your cervix becomes thin and soft (effaced) near your due date. What to expect at prenatal visits You will have prenatal exams every 2 weeks until week 36. Then you will have weekly prenatal exams. During a routine prenatal visit:  You will be weighed to make sure you and the fetus are growing normally.  Your blood pressure will be taken.  Your abdomen will be measured to track your baby's growth.  The fetal heartbeat will be listened to.  Any test results from the previous visit will be discussed.  You may have a cervical check near your due date to see if you have effaced. At around 36 weeks, your health care provider will check your cervix. At the same time, your health care provider will also perform a test on the secretions of the vaginal tissue. This test is to determine if a type of bacteria, Group B streptococcus, is present. Your health care provider will explain this further. Your health care provider may ask you:    What your birth plan is.  How you are feeling.  If you are feeling the baby move.  If you have had any abnormal symptoms, such as leaking fluid, bleeding, severe headaches, or abdominal cramping.  If you are using any tobacco products, including cigarettes, chewing tobacco, and electronic cigarettes.  If you have any questions. Other tests or screenings that may be performed during your third trimester include:  Blood tests that check for low iron levels (anemia).  Fetal testing to check the health,  activity level, and growth of the fetus. Testing is done if you have certain medical conditions or if there are problems during the pregnancy.  Nonstress test (NST). This test checks the health of your baby to make sure there are no signs of problems, such as the baby not getting enough oxygen. During this test, a belt is placed around your belly. The baby is made to move, and its heart rate is monitored during movement. What is false labor? False labor is a condition in which you feel small, irregular tightenings of the muscles in the womb (contractions) that eventually go away. These are called Braxton Hicks contractions. Contractions may last for hours, days, or even weeks before true labor sets in. If contractions come at regular intervals, become more frequent, increase in intensity, or become painful, you should see your health care provider. What are the signs of labor?  Abdominal cramps.  Regular contractions that start at 10 minutes apart and become stronger and more frequent with time.  Contractions that start on the top of the uterus and spread down to the lower abdomen and back.  Increased pelvic pressure and dull back pain.  A watery or bloody mucus discharge that comes from the vagina.  Leaking of amniotic fluid. This is also known as your "water breaking." It could be a slow trickle or a gush. Let your doctor know if it has a color or strange odor. If you have any of these signs, call your health care provider right away, even if it is before your due date. Follow these instructions at home: Eating and drinking  Continue to eat regular, healthy meals.  Do not eat:  Raw meat or meat spreads.  Unpasteurized milk or cheese.  Unpasteurized juice.  Store-made salad.  Refrigerated smoked seafood.  Hot dogs or deli meat, unless they are piping hot.  More than 6 ounces of albacore tuna a week.  Shark, swordfish, king mackerel, or tile fish.  Store-made salads.  Raw  sprouts, such as mung bean or alfalfa sprouts.  Take prenatal vitamins as told by your health care provider.  Take 1000 mg of calcium daily as told by your health care provider.  If you develop constipation:  Take over-the-counter or prescription medicines.  Drink enough fluid to keep your urine clear or pale yellow.  Eat foods that are high in fiber, such as fresh fruits and vegetables, whole grains, and beans.  Limit foods that are high in fat and processed sugars, such as fried and sweet foods. Activity  Exercise only as directed by your health care provider. Healthy pregnant women should aim for 2 hours and 30 minutes of moderate exercise per week. If you experience any pain or discomfort while exercising, stop.  Avoid heavy lifting.  Do not exercise in extreme heat or humidity, or at high altitudes.  Wear low-heel, comfortable shoes.  Practice good posture.  Do not travel far distances unless it is absolutely necessary and only with the approval   of your health care provider.  Wear your seat belt at all times while in a car, on a bus, or on a plane.  Take frequent breaks and rest with your legs elevated if you have leg cramps or low back pain.  Do not use hot tubs, steam rooms, or saunas.  You may continue to have sex unless your health care provider tells you otherwise. Lifestyle  Do not use any products that contain nicotine or tobacco, such as cigarettes and e-cigarettes. If you need help quitting, ask your health care provider.  Do not drink alcohol.  Do not use any medicinal herbs or unprescribed drugs. These chemicals affect the formation and growth of the baby.  If you develop varicose veins:  Wear support pantyhose or compression stockings as told by your healthcare provider.  Elevate your feet for 15 minutes, 3-4 times a day.  Wear a supportive maternity bra to help with breast tenderness. General instructions  Take over-the-counter and prescription  medicines only as told by your health care provider. There are medicines that are either safe or unsafe to take during pregnancy.  Take warm sitz baths to soothe any pain or discomfort caused by hemorrhoids. Use hemorrhoid cream or witch hazel if your health care provider approves.  Avoid cat litter boxes and soil used by cats. These carry germs that can cause birth defects in the baby. If you have a cat, ask someone to clean the litter box for you.  To prepare for the arrival of your baby:  Take prenatal classes to understand, practice, and ask questions about the labor and delivery.  Make a trial run to the hospital.  Visit the hospital and tour the maternity area.  Arrange for maternity or paternity leave through employers.  Arrange for family and friends to take care of pets while you are in the hospital.  Purchase a rear-facing car seat and make sure you know how to install it in your car.  Pack your hospital bag.  Prepare the baby's nursery. Make sure to remove all pillows and stuffed animals from the baby's crib to prevent suffocation.  Visit your dentist if you have not gone during your pregnancy. Use a soft toothbrush to brush your teeth and be gentle when you floss.  Keep all prenatal follow-up visits as told by your health care provider. This is important. Contact a health care provider if:  You are unsure if you are in labor or if your water has broken.  You become dizzy.  You have mild pelvic cramps, pelvic pressure, or nagging pain in your abdominal area.  You have lower back pain.  You have persistent nausea, vomiting, or diarrhea.  You have an unusual or bad smelling vaginal discharge.  You have pain when you urinate. Get help right away if:  You have a fever.  You are leaking fluid from your vagina.  You have spotting or bleeding from your vagina.  You have severe abdominal pain or cramping.  You have rapid weight loss or weight gain.  You have  shortness of breath with chest pain.  You notice sudden or extreme swelling of your face, hands, ankles, feet, or legs.  Your baby makes fewer than 10 movements in 2 hours.  You have severe headaches that do not go away with medicine.  You have vision changes. Summary  The third trimester is from week 29 through week 40, months 7 through 9. The third trimester is a time when the unborn baby (fetus)   is growing rapidly.  During the third trimester, your discomfort may increase as you and your baby continue to gain weight. You may have abdominal, leg, and back pain, sleeping problems, and an increased need to urinate.  During the third trimester your breasts will keep growing and they will continue to become tender. A yellow fluid (colostrum) may leak from your breasts. This is the first milk you are producing for your baby.  False labor is a condition in which you feel small, irregular tightenings of the muscles in the womb (contractions) that eventually go away. These are called Braxton Hicks contractions. Contractions may last for hours, days, or even weeks before true labor sets in.  Signs of labor can include: abdominal cramps; regular contractions that start at 10 minutes apart and become stronger and more frequent with time; watery or bloody mucus discharge that comes from the vagina; increased pelvic pressure and dull back pain; and leaking of amniotic fluid. This information is not intended to replace advice given to you by your health care provider. Make sure you discuss any questions you have with your health care provider. Document Released: 12/03/2001 Document Revised: 05/16/2016 Document Reviewed: 02/09/2013 Elsevier Interactive Patient Education  2017 [employer]  

## 2016-12-23 NOTE — L&D Delivery Note (Signed)
Delivery Note At 1:15 AM a viable female was delivered via Vaginal, Spontaneous Delivery (Presentation:vertex ; LOA ).  APGAR: 8, 9; weight  .   Placenta status:spont ,shultz .  Cord: 3vc with the following complications:none .  Cord pH: n/a  Anesthesia:  local Episiotomy: None Lacerations: 2nd degree Suture Repair: 2.0 vicryl Est. Blood Loss (mL): 100  Mom to postpartum.  Baby to Couplet care / Skin to Skin.  Shelby Graves 01/06/2017, 1:45 AM

## 2016-12-24 ENCOUNTER — Ambulatory Visit (INDEPENDENT_AMBULATORY_CARE_PROVIDER_SITE_OTHER): Payer: Medicaid Other | Admitting: Certified Nurse Midwife

## 2016-12-24 VITALS — BP 96/63 | HR 101 | Temp 98.7°F | Wt 171.2 lb

## 2016-12-24 DIAGNOSIS — F39 Unspecified mood [affective] disorder: Secondary | ICD-10-CM

## 2016-12-24 DIAGNOSIS — J069 Acute upper respiratory infection, unspecified: Secondary | ICD-10-CM

## 2016-12-24 DIAGNOSIS — Z3403 Encounter for supervision of normal first pregnancy, third trimester: Secondary | ICD-10-CM

## 2016-12-24 DIAGNOSIS — D582 Other hemoglobinopathies: Secondary | ICD-10-CM

## 2016-12-24 DIAGNOSIS — D509 Iron deficiency anemia, unspecified: Secondary | ICD-10-CM

## 2016-12-24 DIAGNOSIS — E559 Vitamin D deficiency, unspecified: Secondary | ICD-10-CM

## 2016-12-24 LAB — INFLUENZA A AND B
INFLUENZA A AG, EIA: NEGATIVE
Influenza B Ag, EIA: NEGATIVE

## 2016-12-24 LAB — PLEASE NOTE:

## 2016-12-24 MED ORDER — BENZONATATE 100 MG PO CAPS: 100.0000 mg | ORAL_CAPSULE | Freq: Three times a day (TID) | ORAL | 0 refills | Status: DC | PRN

## 2016-12-24 NOTE — Progress Notes (Signed)
Subjective:    Karl Pockabitha Poppen is a 26 y.o. female being seen today for her obstetrical visit. She is at 262w3d gestation. Patient reports no bleeding, no leaking, occasional contractions and uri illness since last week, has been taking mucinex and tylenol occasionally. Fetal movement: normal.  URI symptoms, denies fever, reports nasal congestion, cough, no sputum production.    Problem List Items Addressed This Visit      Other   IDA (iron deficiency anemia)   Vitamin D deficiency   Mood disorder (HCC)   Supervision of normal pregnancy   Hemoglobin C trait (HCC) - Primary    Other Visit Diagnoses    Viral upper respiratory tract infection       Relevant Medications   benzonatate (TESSALON PERLES) 100 MG capsule   Other Relevant Orders   Influenza a and b     Patient Active Problem List   Diagnosis Date Noted  . Supervision of normal pregnancy 07/11/2016  . 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 Mood disorder (HCC) 12/02/2012  . IDA (iron deficiency anemia) 07/16/2012  . Vitamin D deficiency 07/16/2012    Objective:    BP 96/63   Pulse (!) 101   Temp 98.7 F (37.1 C)   Wt 171 lb 3.2 oz (77.7 kg)   LMP 03/31/2016   BMI 28.49 kg/m  FHT: 146 BPM  Uterine Size: 37 cm and size equals dates  Presentations: cephalic  Pelvic Exam: deferred     Assessment:    Pregnancy @ 252w3d weeks   Acute URI/ILI r/u influenza  Plan:   Plans for delivery: Vaginal anticipated; labs reviewed; problem list updated  Waterbirth planned  Rapid flu testing. Infant feeding: plans to breastfeed. Cigarette smoking: never smoked. L&D discussion: symptoms of labor, discussed when to call, discussed what number to call, anesthetic/analgesic options reviewed and delivering clinician:  plans no preference. Postpartum supports and preparation: circumcision discussed and contraception plans discussed.  Follow up in 1 Week.

## 2016-12-29 ENCOUNTER — Inpatient Hospital Stay (HOSPITAL_COMMUNITY)
Admission: AD | Admit: 2016-12-29 | Discharge: 2016-12-29 | Disposition: A | Discharge status: Home / Self Care | Payer: Medicaid Other | Source: Ambulatory Visit | Attending: Obstetrics and Gynecology | Admitting: Obstetrics and Gynecology

## 2016-12-29 ENCOUNTER — Encounter (HOSPITAL_COMMUNITY): Payer: Self-pay

## 2016-12-29 DIAGNOSIS — Z3A00 Weeks of gestation of pregnancy not specified: Secondary | ICD-10-CM | POA: Diagnosis not present

## 2016-12-29 LAB — AMNISURE RUPTURE OF MEMBRANE (ROM) NOT AT ARMC: AMNISURE: NEGATIVE

## 2016-12-29 NOTE — MAU Note (Signed)
Pt c/o contractions since last night but now every 5 mins. Denies vag bleeding, Noticed some trickling of watery discharge that she noticed yesterday morning. Continues to trickle every now and then but nothing that has saturated a pad. +FM. Cervix has not been checked.

## 2016-12-31 ENCOUNTER — Encounter (HOSPITAL_COMMUNITY): Payer: Self-pay

## 2016-12-31 ENCOUNTER — Inpatient Hospital Stay (HOSPITAL_COMMUNITY)
Admission: AD | Admit: 2016-12-31 | Discharge: 2016-12-31 | Disposition: A | Discharge status: Home / Self Care | Payer: Medicaid Other | Source: Ambulatory Visit | Attending: Obstetrics & Gynecology | Admitting: Obstetrics & Gynecology

## 2016-12-31 DIAGNOSIS — O26893 Other specified pregnancy related conditions, third trimester: Secondary | ICD-10-CM | POA: Diagnosis not present

## 2016-12-31 DIAGNOSIS — O99013 Anemia complicating pregnancy, third trimester: Secondary | ICD-10-CM | POA: Insufficient documentation

## 2016-12-31 DIAGNOSIS — E559 Vitamin D deficiency, unspecified: Secondary | ICD-10-CM | POA: Diagnosis not present

## 2016-12-31 DIAGNOSIS — D582 Other hemoglobinopathies: Secondary | ICD-10-CM | POA: Diagnosis not present

## 2016-12-31 DIAGNOSIS — Z3A38 38 weeks gestation of pregnancy: Secondary | ICD-10-CM | POA: Diagnosis not present

## 2016-12-31 DIAGNOSIS — D509 Iron deficiency anemia, unspecified: Secondary | ICD-10-CM | POA: Diagnosis not present

## 2016-12-31 LAB — URINALYSIS, ROUTINE W REFLEX MICROSCOPIC
BILIRUBIN URINE: NEGATIVE
Glucose, UA: NEGATIVE mg/dL
Hgb urine dipstick: NEGATIVE
KETONES UR: NEGATIVE mg/dL
LEUKOCYTES UA: NEGATIVE
Nitrite: NEGATIVE
PH: 6 (ref 5.0–8.0)
Protein, ur: NEGATIVE mg/dL

## 2016-12-31 MED ORDER — ACETAMINOPHEN 325 MG PO TABS
650.0000 mg | ORAL_TABLET | Freq: Once | ORAL | Status: AC
  Administered 2016-12-31: 650 mg via ORAL
  Filled 2016-12-31: qty 2

## 2016-12-31 NOTE — Discharge Instructions (Signed)
Third Trimester of Pregnancy °The third trimester is from week 29 through week 40 (months 7 through 9). The third trimester is a time when the unborn baby (fetus) is growing rapidly. At the end of the ninth month, the fetus is about 20 inches in length and weighs 6-10 pounds. °Body changes during your third trimester °Your body goes through many changes during pregnancy. The changes vary from woman to woman. During the third trimester: °· Your weight will continue to increase. You can expect to gain 25-35 pounds (11-16 kg) by the end of the pregnancy. °· You may begin to get stretch marks on your hips, abdomen, and breasts. °· You may urinate more often because the fetus is moving lower into your pelvis and pressing on your bladder. °· You may develop or continue to have heartburn. This is caused by increased hormones that slow down muscles in the digestive tract. °· You may develop or continue to have constipation because increased hormones slow digestion and cause the muscles that push waste through your intestines to relax. °· You may develop hemorrhoids. These are swollen veins (varicose veins) in the rectum that can itch or be painful. °· You may develop swollen, bulging veins (varicose veins) in your legs. °· You may have increased body aches in the pelvis, back, or thighs. This is due to weight gain and increased hormones that are relaxing your joints. °· You may have changes in your hair. These can include thickening of your hair, rapid growth, and changes in texture. Some women also have hair loss during or after pregnancy, or hair that feels dry or thin. Your hair will most likely return to normal after your baby is born. °· Your breasts will continue to grow and they will continue to become tender. A yellow fluid (colostrum) may leak from your breasts. This is the first milk you are producing for your baby. °· Your belly button may stick out. °· You may notice more swelling in your hands, face, or  ankles. °· You may have increased tingling or numbness in your hands, arms, and legs. The skin on your belly may also feel numb. °· You may feel short of breath because of your expanding uterus. °· You may have more problems sleeping. This can be caused by the size of your belly, increased need to urinate, and an increase in your body's metabolism. °· You may notice the fetus "dropping," or moving lower in your abdomen. °· You may have increased vaginal discharge. °· Your cervix becomes thin and soft (effaced) near your due date. °What to expect at prenatal visits °You will have prenatal exams every 2 weeks until week 36. Then you will have weekly prenatal exams. During a routine prenatal visit: °· You will be weighed to make sure you and the fetus are growing normally. °· Your blood pressure will be taken. °· Your abdomen will be measured to track your baby's growth. °· The fetal heartbeat will be listened to. °· Any test results from the previous visit will be discussed. °· You may have a cervical check near your due date to see if you have effaced. °At around 36 weeks, your health care provider will check your cervix. At the same time, your health care provider will also perform a test on the secretions of the vaginal tissue. This test is to determine if a type of bacteria, Group B streptococcus, is present. Your health care provider will explain this further. °Your health care provider may ask you: °·   What your birth plan is. °· How you are feeling. °· If you are feeling the baby move. °· If you have had any abnormal symptoms, such as leaking fluid, bleeding, severe headaches, or abdominal cramping. °· If you are using any tobacco products, including cigarettes, chewing tobacco, and electronic cigarettes. °· If you have any questions. °Other tests or screenings that may be performed during your third trimester include: °· Blood tests that check for low iron levels (anemia). °· Fetal testing to check the health,  activity level, and growth of the fetus. Testing is done if you have certain medical conditions or if there are problems during the pregnancy. °· Nonstress test (NST). This test checks the health of your baby to make sure there are no signs of problems, such as the baby not getting enough oxygen. During this test, a belt is placed around your belly. The baby is made to move, and its heart rate is monitored during movement. °What is false labor? °False labor is a condition in which you feel small, irregular tightenings of the muscles in the womb (contractions) that eventually go away. These are called Braxton Hicks contractions. Contractions may last for hours, days, or even weeks before true labor sets in. If contractions come at regular intervals, become more frequent, increase in intensity, or become painful, you should see your health care provider. °What are the signs of labor? °· Abdominal cramps. °· Regular contractions that start at 10 minutes apart and become stronger and more frequent with time. °· Contractions that start on the top of the uterus and spread down to the lower abdomen and back. °· Increased pelvic pressure and dull back pain. °· A watery or bloody mucus discharge that comes from the vagina. °· Leaking of amniotic fluid. This is also known as your "water breaking." It could be a slow trickle or a gush. Let your doctor know if it has a color or strange odor. °If you have any of these signs, call your health care provider right away, even if it is before your due date. °Follow these instructions at home: °Eating and drinking °· Continue to eat regular, healthy meals. °· Do not eat: °¨ Raw meat or meat spreads. °¨ Unpasteurized milk or cheese. °¨ Unpasteurized juice. °¨ Store-made salad. °¨ Refrigerated smoked seafood. °¨ Hot dogs or deli meat, unless they are piping hot. °¨ More than 6 ounces of albacore tuna a week. °¨ Shark, swordfish, king mackerel, or tile fish. °¨ Store-made salads. °¨ Raw  sprouts, such as mung bean or alfalfa sprouts. °· Take prenatal vitamins as told by your health care provider. °· Take 1000 mg of calcium daily as told by your health care provider. °· If you develop constipation: °¨ Take over-the-counter or prescription medicines. °¨ Drink enough fluid to keep your urine clear or pale yellow. °¨ Eat foods that are high in fiber, such as fresh fruits and vegetables, whole grains, and beans. °¨ Limit foods that are high in fat and processed sugars, such as fried and sweet foods. °Activity °· Exercise only as directed by your health care provider. Healthy pregnant women should aim for 2 hours and 30 minutes of moderate exercise per week. If you experience any pain or discomfort while exercising, stop. °· Avoid heavy lifting. °· Do not exercise in extreme heat or humidity, or at high altitudes. °· Wear low-heel, comfortable shoes. °· Practice good posture. °· Do not travel far distances unless it is absolutely necessary and only with the approval   of your health care provider. °· Wear your seat belt at all times while in a car, on a bus, or on a plane. °· Take frequent breaks and rest with your legs elevated if you have leg cramps or low back pain. °· Do not use hot tubs, steam rooms, or saunas. °· You may continue to have sex unless your health care provider tells you otherwise. °Lifestyle °· Do not use any products that contain nicotine or tobacco, such as cigarettes and e-cigarettes. If you need help quitting, ask your health care provider. °· Do not drink alcohol. °· Do not use any medicinal herbs or unprescribed drugs. These chemicals affect the formation and growth of the baby. °· If you develop varicose veins: °¨ Wear support pantyhose or compression stockings as told by your healthcare provider. °¨ Elevate your feet for 15 minutes, 3-4 times a day. °· Wear a supportive maternity bra to help with breast tenderness. °General instructions °· Take over-the-counter and prescription  medicines only as told by your health care provider. There are medicines that are either safe or unsafe to take during pregnancy. °· Take warm sitz baths to soothe any pain or discomfort caused by hemorrhoids. Use hemorrhoid cream or witch hazel if your health care provider approves. °· Avoid cat litter boxes and soil used by cats. These carry germs that can cause birth defects in the baby. If you have a cat, ask someone to clean the litter box for you. °· To prepare for the arrival of your baby: °¨ Take prenatal classes to understand, practice, and ask questions about the labor and delivery. °¨ Make a trial run to the hospital. °¨ Visit the hospital and tour the maternity area. °¨ Arrange for maternity or paternity leave through employers. °¨ Arrange for family and friends to take care of pets while you are in the hospital. °¨ Purchase a rear-facing car seat and make sure you know how to install it in your car. °¨ Pack your hospital bag. °¨ Prepare the baby’s nursery. Make sure to remove all pillows and stuffed animals from the baby's crib to prevent suffocation. °· Visit your dentist if you have not gone during your pregnancy. Use a soft toothbrush to brush your teeth and be gentle when you floss. °· Keep all prenatal follow-up visits as told by your health care provider. This is important. °Contact a health care provider if: °· You are unsure if you are in labor or if your water has broken. °· You become dizzy. °· You have mild pelvic cramps, pelvic pressure, or nagging pain in your abdominal area. °· You have lower back pain. °· You have persistent nausea, vomiting, or diarrhea. °· You have an unusual or bad smelling vaginal discharge. °· You have pain when you urinate. °Get help right away if: °· You have a fever. °· You are leaking fluid from your vagina. °· You have spotting or bleeding from your vagina. °· You have severe abdominal pain or cramping. °· You have rapid weight loss or weight gain. °· You have  shortness of breath with chest pain. °· You notice sudden or extreme swelling of your face, hands, ankles, feet, or legs. °· Your baby makes fewer than 10 movements in 2 hours. °· You have severe headaches that do not go away with medicine. °· You have vision changes. °Summary °· The third trimester is from week 29 through week 40, months 7 through 9. The third trimester is a time when the unborn baby (fetus)   is growing rapidly. °· During the third trimester, your discomfort may increase as you and your baby continue to gain weight. You may have abdominal, leg, and back pain, sleeping problems, and an increased need to urinate. °· During the third trimester your breasts will keep growing and they will continue to become tender. A yellow fluid (colostrum) may leak from your breasts. This is the first milk you are producing for your baby. °· False labor is a condition in which you feel small, irregular tightenings of the muscles in the womb (contractions) that eventually go away. These are called Braxton Hicks contractions. Contractions may last for hours, days, or even weeks before true labor sets in. °· Signs of labor can include: abdominal cramps; regular contractions that start at 10 minutes apart and become stronger and more frequent with time; watery or bloody mucus discharge that comes from the vagina; increased pelvic pressure and dull back pain; and leaking of amniotic fluid. °This information is not intended to replace advice given to you by your health care provider. Make sure you discuss any questions you have with your health care provider. °Document Released: 12/03/2001 Document Revised: 05/16/2016 Document Reviewed: 02/09/2013 °Elsevier Interactive Patient Education © 2017 [employer] °Introduction °Patient Name: ________________________________________________ Patient Due Date: ____________________ °What is a fetal movement count? °A fetal movement count is the number of times that you feel your baby  move during a certain amount of time. This may also be called a fetal kick count. A fetal movement count is recommended for every pregnant woman. You may be asked to start counting fetal movements as early as week 28 of your pregnancy. °Pay attention to when your baby is most active. You may notice your baby's sleep and wake cycles. You may also notice things that make your baby move more. You should do a fetal movement count: °· When your baby is normally most active. °· At the same time each day. °A good time to count movements is while you are resting, after having something to eat and drink. °How do I count fetal movements? °1. Find a quiet, comfortable area. Sit, or lie down on your side. °2. Write down the date, the start time and stop time, and the number of movements that you felt between those two times. Take this information with you to your health care visits. °3. For 2 hours, count kicks, flutters, swishes, rolls, and jabs. You should feel at least 10 movements during 2 hours. °4. You may stop counting after you have felt 10 movements. °5. If you do not feel 10 movements in 2 hours, have something to eat and drink. Then, keep resting and counting for 1 hour. If you feel at least 4 movements during that hour, you may stop counting. °Contact a health care provider if: °· You feel fewer than 4 movements in 2 hours. °· Your baby is not moving like he or she usually does. °Date: ____________ Start time: ____________ Stop time: ____________ Movements: ____________ °Date: ____________ Start time: ____________ Stop time: ____________ Movements: ____________ °Date: ____________ Start time: ____________ Stop time: ____________ Movements: ____________ °Date: ____________ Start time: ____________ Stop time: ____________ Movements: ____________ °Date: ____________ Start time: ____________ Stop time: ____________ Movements: ____________ °Date: ____________ Start time: ____________ Stop time: ____________ Movements:  ____________ °Date: ____________ Start time: ____________ Stop time: ____________ Movements: ____________ °Date: ____________ Start time: ____________ Stop time: ____________ Movements: ____________ °Date: ____________ Start time: ____________ Stop time: ____________ Movements: ____________ °This information is not intended to replace   advice given to you by your health care provider. Make sure you discuss any questions you have with your health care provider. °Document Released: 01/08/2007 Document Revised: 08/07/2016 Document Reviewed: 01/18/2016 °Elsevier Interactive Patient Education © 2017 [employer] °Braxton Hicks Contractions °Contractions of the uterus can occur throughout pregnancy. Contractions are not always a sign that you are in labor.  °WHAT ARE BRAXTON HICKS CONTRACTIONS?  °Contractions that occur before labor are called Braxton Hicks contractions, or false labor. Toward the end of pregnancy (32-34 weeks), these contractions can develop more often and may become more forceful. This is not true labor because these contractions do not result in opening (dilatation) and thinning of the cervix. They are sometimes difficult to tell apart from true labor because these contractions can be forceful and people have different pain tolerances. You should not feel embarrassed if you go to the hospital with false labor. Sometimes, the only way to tell if you are in true labor is for your health care provider to look for changes in the cervix. °If there are no prenatal problems or other health problems associated with the pregnancy, it is completely safe to be sent home with false labor and await the onset of true labor. °HOW CAN YOU TELL THE DIFFERENCE BETWEEN TRUE AND FALSE LABOR? °False Labor  °· The contractions of false labor are usually shorter and not as hard as those of true labor.   °· The contractions are usually irregular.   °· The contractions are often felt in the front of the lower abdomen and in  the groin.   °· The contractions may go away when you walk around or change positions while lying down.   °· The contractions get weaker and are shorter lasting as time goes on.   °· The contractions do not usually become progressively stronger, regular, and closer together as with true labor.   °True Labor  °· Contractions in true labor last 30-70 seconds, become very regular, usually become more intense, and increase in frequency.   °· The contractions do not go away with walking.   °· The discomfort is usually felt in the top of the uterus and spreads to the lower abdomen and low back.   °· True labor can be determined by your health care provider with an exam. This will show that the cervix is dilating and getting thinner.   °WHAT TO REMEMBER °· Keep up with your usual exercises and follow other instructions given by your health care provider.   °· Take medicines as directed by your health care provider.   °· Keep your regular prenatal appointments.   °· Eat and drink lightly if you think you are going into labor.   °· If Braxton Hicks contractions are making you uncomfortable:   °¨ Change your position from lying down or resting to walking, or from walking to resting.   °¨ Sit and rest in a tub of warm water.   °¨ Drink 2-3 glasses of water. Dehydration may cause these contractions.   °¨ Do slow and deep breathing several times an hour.   °WHEN SHOULD I SEEK IMMEDIATE MEDICAL CARE? °Seek immediate medical care if: °· Your contractions become stronger, more regular, and closer together.   °· You have fluid leaking or gushing from your vagina.   °· You have a fever.   °· You pass blood-tinged mucus.   °· You have vaginal bleeding.   °· You have continuous abdominal pain.   °· You have low back pain that you never had before.   °· You feel your baby's head pushing down and causing pelvic pressure.   °· Your   baby is not moving as much as it used to.   °This information is not intended to replace advice given to you  by your health care provider. Make sure you discuss any questions you have with your health care provider. °Document Released: 12/09/2005 Document Revised: 04/01/2016 Document Reviewed: 09/20/2013 °Elsevier Interactive Patient Education © 2017 [employer] ° °

## 2016-12-31 NOTE — Progress Notes (Signed)
Requested Dr. Omer JackMumaw review FHR tracing before discharge home. MD states ok to discharge home with labor precautions.

## 2016-12-31 NOTE — MAU Note (Signed)
Patient repeatedly told that we need to have the FHR monitoring for 20 continuous minutes to assess fetal wellbeing, but pt places bed in high fowlers position with computer in her lap. Requested again that patient leave the bed back and keep the fetal monitoring in place for 20 minutes per Dr. Omer JackMumaw request.

## 2016-12-31 NOTE — MAU Note (Signed)
Pt reports for the last hour she has had a headache, chills, and nausea. Pt also reports contractions and pressure.

## 2017-01-01 ENCOUNTER — Ambulatory Visit (INDEPENDENT_AMBULATORY_CARE_PROVIDER_SITE_OTHER): Payer: Medicaid Other | Admitting: Certified Nurse Midwife

## 2017-01-01 VITALS — BP 108/66 | HR 92 | Temp 98.6°F | Wt 173.5 lb

## 2017-01-01 DIAGNOSIS — E559 Vitamin D deficiency, unspecified: Secondary | ICD-10-CM | POA: Diagnosis not present

## 2017-01-01 DIAGNOSIS — D509 Iron deficiency anemia, unspecified: Secondary | ICD-10-CM

## 2017-01-01 DIAGNOSIS — O99013 Anemia complicating pregnancy, third trimester: Secondary | ICD-10-CM

## 2017-01-01 DIAGNOSIS — D582 Other hemoglobinopathies: Secondary | ICD-10-CM | POA: Diagnosis not present

## 2017-01-01 DIAGNOSIS — Z3403 Encounter for supervision of normal first pregnancy, third trimester: Secondary | ICD-10-CM

## 2017-01-01 DIAGNOSIS — F39 Unspecified mood [affective] disorder: Secondary | ICD-10-CM

## 2017-01-01 NOTE — Progress Notes (Signed)
   PRENATAL VISIT NOTE  Subjective:  Shelby Graves is a 26 y.o. G1P0000 at 6350w4d being seen today for ongoing prenatal care.  She is currently monitored for the following issues for this low-risk pregnancy and has IDA (iron deficiency anemia); Vitamin D deficiency; Mood disorder (HCC); Supervision of normal pregnancy; and Hemoglobin C trait (HCC) on her problem list.  Patient reports no bleeding, no leaking, occasional contractions and uri symptoms. URI symptoms, denies fever, reports nasal congestion, cough, no sputum production.  12/24/16: flu testing and strep was negative.    Contractions: Irregular. Vag. Bleeding: None.  Movement: Present. Denies leaking of fluid.   The following portions of the patient's history were reviewed and updated as appropriate: allergies, current medications, past family history, past medical history, past social history, past surgical history and problem list. Problem list updated.  Objective:   Vitals:   01/01/17 1353  BP: 108/66  Pulse: 92  Temp: 98.6 F (37 C)  Weight: 173 lb 8 oz (78.7 kg)    Fetal Status: Fetal Heart Rate (bpm): 140 Fundal Height: 40 cm Movement: Present  Presentation: Vertex  General:  Alert, oriented and cooperative. Patient is in no acute distress.  Skin: Skin is warm and dry. No rash noted.   Cardiovascular: Normal heart rate noted  Respiratory: Normal respiratory effort, no problems with respiration noted  Abdomen: Soft, gravid, appropriate for gestational age. Pain/Pressure: Present     Pelvic:  Cervical exam performed Dilation: 1 Effacement (%): 20 Station: -3  Unchanged from 1 month ago.   Extremities: Normal range of motion.     Mental Status: Normal mood and affect. Normal behavior. Normal judgment and thought content.   Assessment and Plan:  Pregnancy: G1P0000 at 6350w4d  1. Iron deficiency anemia, unspecified iron deficiency anemia type  HgB: 10.8 11/17, recheck today  Has been taking iron.    2. Vitamin D  deficiency     4 years ago was low.  F/U blood test ordered.   3. Mood disorder (HCC)     Not currently taking any medications.   4. Encounter for supervision of normal first pregnancy in third trimester     F/U labs     Acute URI  5. Hemoglobin C trait (HCC)       Term labor symptoms and general obstetric precautions including but not limited to vaginal bleeding, contractions, leaking of fluid and fetal movement were reviewed in detail with the patient. Please refer to After Visit Summary for other counseling recommendations.  Return in about 1 week (around 01/08/2017).   Roe Coombsachelle A Barbarajean Kinzler, CNM

## 2017-01-02 LAB — CBC
Hematocrit: 35.3 % (ref 34.0–46.6)
Hemoglobin: 12.5 g/dL (ref 11.1–15.9)
MCH: 29.2 pg (ref 26.6–33.0)
MCHC: 35.4 g/dL (ref 31.5–35.7)
MCV: 83 fL (ref 79–97)
PLATELETS: 207 10*3/uL (ref 150–379)
RBC: 4.28 x10E6/uL (ref 3.77–5.28)
RDW: 16.1 % — AB (ref 12.3–15.4)
WBC: 8.2 10*3/uL (ref 3.4–10.8)

## 2017-01-02 LAB — VITAMIN D 25 HYDROXY (VIT D DEFICIENCY, FRACTURES): Vit D, 25-Hydroxy: 32.2 ng/mL (ref 30.0–100.0)

## 2017-01-05 ENCOUNTER — Encounter (HOSPITAL_COMMUNITY): Payer: Self-pay | Admitting: Certified Nurse Midwife

## 2017-01-05 ENCOUNTER — Inpatient Hospital Stay (HOSPITAL_COMMUNITY)
Admission: AD | Admit: 2017-01-05 | Discharge: 2017-01-08 | DRG: 775 | Disposition: A | Discharge status: Home / Self Care | Payer: Medicaid Other | Source: Ambulatory Visit | Attending: Family Medicine | Admitting: Family Medicine

## 2017-01-05 DIAGNOSIS — Z833 Family history of diabetes mellitus: Secondary | ICD-10-CM | POA: Diagnosis not present

## 2017-01-05 DIAGNOSIS — R2232 Localized swelling, mass and lump, left upper limb: Secondary | ICD-10-CM

## 2017-01-05 DIAGNOSIS — O26893 Other specified pregnancy related conditions, third trimester: Secondary | ICD-10-CM | POA: Diagnosis present

## 2017-01-05 DIAGNOSIS — O9952 Diseases of the respiratory system complicating childbirth: Secondary | ICD-10-CM | POA: Diagnosis present

## 2017-01-05 DIAGNOSIS — J45909 Unspecified asthma, uncomplicated: Secondary | ICD-10-CM | POA: Diagnosis present

## 2017-01-05 DIAGNOSIS — O4292 Full-term premature rupture of membranes, unspecified as to length of time between rupture and onset of labor: Secondary | ICD-10-CM | POA: Diagnosis present

## 2017-01-05 DIAGNOSIS — N6489 Other specified disorders of breast: Secondary | ICD-10-CM | POA: Diagnosis present

## 2017-01-05 DIAGNOSIS — Z3A39 39 weeks gestation of pregnancy: Secondary | ICD-10-CM

## 2017-01-05 DIAGNOSIS — Z87891 Personal history of nicotine dependence: Secondary | ICD-10-CM

## 2017-01-05 DIAGNOSIS — Z8249 Family history of ischemic heart disease and other diseases of the circulatory system: Secondary | ICD-10-CM | POA: Diagnosis not present

## 2017-01-05 DIAGNOSIS — O4202 Full-term premature rupture of membranes, onset of labor within 24 hours of rupture: Secondary | ICD-10-CM | POA: Diagnosis not present

## 2017-01-05 LAB — CBC
HCT: 32.3 % — ABNORMAL LOW (ref 36.0–46.0)
Hemoglobin: 11.8 g/dL — ABNORMAL LOW (ref 12.0–15.0)
MCH: 29.4 pg (ref 26.0–34.0)
MCHC: 36.5 g/dL — ABNORMAL HIGH (ref 30.0–36.0)
MCV: 80.3 fL (ref 78.0–100.0)
PLATELETS: 204 10*3/uL (ref 150–400)
RBC: 4.02 MIL/uL (ref 3.87–5.11)
RDW: 15.2 % (ref 11.5–15.5)
WBC: 8 10*3/uL (ref 4.0–10.5)

## 2017-01-05 LAB — RPR: RPR: NONREACTIVE

## 2017-01-05 LAB — TYPE AND SCREEN
ABO/RH(D): AB POS
Antibody Screen: NEGATIVE

## 2017-01-05 LAB — AMNISURE RUPTURE OF MEMBRANE (ROM) NOT AT ARMC: AMNISURE: POSITIVE

## 2017-01-05 LAB — POCT FERN TEST: POCT Fern Test: NEGATIVE

## 2017-01-05 MED ORDER — ONDANSETRON HCL 4 MG/2ML IJ SOLN: 4.0000 mg | Freq: Four times a day (QID) | INTRAMUSCULAR | Status: DC | PRN

## 2017-01-05 MED ORDER — ALBUTEROL SULFATE (2.5 MG/3ML) 0.083% IN NEBU: 2.5000 mg | INHALATION_SOLUTION | Freq: Four times a day (QID) | RESPIRATORY_TRACT | Status: DC | PRN

## 2017-01-05 MED ORDER — NALBUPHINE HCL 10 MG/ML IJ SOLN
10.0000 mg | INTRAMUSCULAR | Status: DC | PRN
  Administered 2017-01-05: 10 mg via INTRAVENOUS
  Filled 2017-01-05: qty 1

## 2017-01-05 MED ORDER — FLEET ENEMA 7-19 GM/118ML RE ENEM: 1.0000 | ENEMA | Freq: Every day | RECTAL | Status: DC | PRN

## 2017-01-05 MED ORDER — LACTATED RINGERS IV SOLN: 500.0000 mL | INTRAVENOUS | Status: DC | PRN

## 2017-01-05 MED ORDER — OXYCODONE-ACETAMINOPHEN 5-325 MG PO TABS: 1.0000 | ORAL_TABLET | ORAL | Status: DC | PRN

## 2017-01-05 MED ORDER — LIDOCAINE HCL (PF) 1 % IJ SOLN
30.0000 mL | INTRAMUSCULAR | Status: AC | PRN
  Administered 2017-01-06: 30 mL via SUBCUTANEOUS
  Filled 2017-01-05: qty 30

## 2017-01-05 MED ORDER — OXYTOCIN 40 UNITS IN LACTATED RINGERS INFUSION - SIMPLE MED
2.5000 [IU]/h | INTRAVENOUS | Status: DC
  Filled 2017-01-05: qty 1000

## 2017-01-05 MED ORDER — ACETAMINOPHEN 325 MG PO TABS: 650.0000 mg | ORAL_TABLET | ORAL | Status: DC | PRN

## 2017-01-05 MED ORDER — SOD CITRATE-CITRIC ACID 500-334 MG/5ML PO SOLN: 30.0000 mL | ORAL | Status: DC | PRN

## 2017-01-05 MED ORDER — PROMETHAZINE HCL 25 MG/ML IJ SOLN
12.5000 mg | INTRAMUSCULAR | Status: DC | PRN
  Administered 2017-01-05: 12.5 mg via INTRAVENOUS
  Filled 2017-01-05: qty 1

## 2017-01-05 MED ORDER — PANTOPRAZOLE SODIUM 40 MG PO TBEC
40.0000 mg | DELAYED_RELEASE_TABLET | Freq: Every day | ORAL | Status: DC
Start: 2017-01-05 — End: 2017-01-06
  Filled 2017-01-05: qty 1

## 2017-01-05 MED ORDER — OXYCODONE-ACETAMINOPHEN 5-325 MG PO TABS: 2.0000 | ORAL_TABLET | ORAL | Status: DC | PRN

## 2017-01-05 MED ORDER — LACTATED RINGERS IV SOLN: INTRAVENOUS | Status: DC

## 2017-01-05 MED ORDER — OXYTOCIN BOLUS FROM INFUSION
500.0000 mL | Freq: Once | INTRAVENOUS | Status: AC
  Administered 2017-01-06: 500 mL via INTRAVENOUS

## 2017-01-05 MED ORDER — MISOPROSTOL 200 MCG PO TABS
50.0000 ug | ORAL_TABLET | ORAL | Status: DC
  Administered 2017-01-05 (×2): 50 ug via ORAL
  Filled 2017-01-05 (×2): qty 0.5

## 2017-01-05 NOTE — Progress Notes (Signed)
Patient provided education on Cytotec. Pt states she does not want to be on the efm for 2 hours straight if she takes the Cytotec. Refuses at this time. Pt request midwife come to her room. Zerita Boersarlene Lawson notified

## 2017-01-05 NOTE — Anesthesia Pain Management Evaluation Note (Signed)
  CRNA Pain Management Visit Note  Patient: Shelby Graves, 26 y.o., female  "Hello I am a member of the anesthesia team at [facility]. We have an anesthesia team available at all times to provide care throughout the hospital, including epidural management and anesthesia for C-section. I don't know your plan for the delivery whether it a natural birth, water birth, IV sedation, nitrous supplementation, doula or epidural, but we want to meet your pain goals."    1.Was your pain managed to your expectations on prior hospitalizations?   No prior hospitalizations  2.What is your expectation for pain management during this hospitalization?     Water tub  3.How can we help you reach that goal? unsure  Record the patient's initial score and the patient's pain goal.   Pain: 7  Pain Goal: 10 The [facility] wants you to be able to say your pain was always managed very well.  Cephus ShellingBURGER,Konica Stankowski 01/05/2017

## 2017-01-05 NOTE — Progress Notes (Signed)
Shelby Graves is a 26 y.o. G1P0000 at 2347w1d by ultrasound admitted for rupture of membranes  Subjective:   Objective: BP 109/72   Pulse 63   Temp 97.8 F (36.6 C) (Oral)   Resp 16   Ht 5\' 5"  (1.651 m)   Wt 170 lb (77.1 kg)   LMP 03/31/2016   BMI 28.29 kg/m  No intake/output data recorded. No intake/output data recorded.  FHT:  FHR: 130's bpm, variability: moderate,  accelerations:  Present,  decelerations:  Absent UC:   irregular, every 6-9 minutes and mild SVE:   Dilation: 1.5 Effacement (%): Thick Station: -3 Exam by:: m hoppenbauer rn  (pt request cervical exam at this time)  Labs: Lab Results  Component Value Date   WBC 8.0 01/05/2017   HGB 11.8 (L) 01/05/2017   HCT 32.3 (L) 01/05/2017   MCV 80.3 01/05/2017   PLT 204 01/05/2017    Assessment / Plan: SROM yet to be in labor  Labor: yet to be in labor Preeclampsia:  no signs or symptoms of toxicity and intake and ouput balanced Fetal Wellbeing:  Category I Pain Control:  Labor support without medications I/D:  n/a Anticipated MOD:  NSVD  Wyvonnia DuskyMarie Lawson 01/05/2017, 9:58 AM

## 2017-01-05 NOTE — Progress Notes (Signed)
Pt refuses IV and Pitocin at this time

## 2017-01-05 NOTE — H&P (Signed)
LABOR ADMISSION HISTORY AND PHYSICAL  Karl Pockabitha Albritton is a 26 y.o. female G1P0000 with IUP at 6229w1d by 6 wk U/S presenting for SROM. She reports +FM, + contractions, +LOF, no VB, no blurry vision, headaches or peripheral edema, and RUQ pain.  She plans on breast feeding. She in undecided on birth control.  Dating: By 6 wk U/S --->  Estimated Date of Delivery: 01/11/17  Sono:    @[redacted]w[redacted]d , CWD, normal anatomy, breech presentation,  346g,  @[redacted]w[redacted]d , CWD, normal anatomy, variable presentation, 402 g, 66%   Prenatal History/Complications: Hgb C Trait  Past Medical History: Past Medical History:  Diagnosis Date  . Anemia 2012  . Anxiety   . Arthritis   . Asthma 2009   last used inhaler 2009  . Depression   . H/O varicella   . History of gonorrhea   . Hx of chlamydia infection   . Hyperventilation   . Migraine 2010  . PID (pelvic inflammatory disease) 2012   GC and chlamydia     Past Surgical History: History reviewed. No pertinent surgical history.  Obstetrical History: OB History    Gravida Para Term Preterm AB Living   1 0 0 0 0 0   SAB TAB Ectopic Multiple Live Births   0 0 0 0        Social History: Social History   Social History  . Marital status: Single    Spouse name: N/A  . Number of children: N/A  . Years of education: 4313   Occupational History  . 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 Smoking status: Former Smoker    Start date: 09/15/2012  . Smokeless tobacco: Never Used  . Alcohol use No     Comment: none with pregnancy  . Drug use: No  . Sexual activity: Not Currently    Birth control/ protection: Condom   Other Topics Concern  . None   Social History Narrative   Lives alone. Own apartment.     Family History: Family History  Problem Relation Age of Onset  . Hyperlipidemia Mother   . Hypertension Mother   . Kidney disease Mother   . Hypertension Father   . Hypertension Brother   .  Hyperlipidemia Brother   . Diabetes Maternal Grandmother   . Diabetes Maternal Grandfather   . Cancer Maternal Aunt 58    Ovarian CA    Allergies: Allergies  Allergen Reactions  . Latex Hives    Prescriptions Prior to Admission  Medication Sig Dispense Refill Last Dose  . albuterol (PROVENTIL HFA;VENTOLIN HFA) 108 (90 Base) MCG/ACT inhaler Inhale 2 puffs into the lungs every 6 (six) hours as needed for wheezing or shortness of breath.   rescue  . benzonatate (TESSALON PERLES) 100 MG capsule Take 1 capsule (100 mg total) by mouth 3 (three) times daily as needed for cough. 30 capsule 0 Past Week at Unknown time  . ferrous sulfate 325 (65 FE) MG tablet Take 1 tablet (325 mg total) by mouth 2 (two) times daily with a meal. 60 tablet 3 12/31/2016 at Unknown time  . guaiFENesin (ROBITUSSIN) 100 MG/5ML liquid Take 400 mg by mouth 3 (three) times daily as needed for cough.   12/31/2016 at Unknown time  . pantoprazole (PROTONIX) 40 MG tablet Take 1 tablet (40 mg total) by mouth daily. 30 tablet 6 12/31/2016 at Unknown time  . Prenat-FeCbn-FeAspGl-FA-Omega (OB COMPLETE PETITE) 35-5-1-200  MG CAPS Take 1 tablet by mouth daily. 30 capsule 12 12/31/2016 at Unknown time  . terconazole (TERAZOL 3) 0.8 % vaginal cream Place 1 applicator vaginally at bedtime. (Patient not taking: Reported on 12/31/2016) 20 g 0 Not Taking at Unknown time     Review of Systems   All systems reviewed and negative except as stated in HPI  BP 116/70 (BP Location: Right Arm)   Pulse 83   Temp 97.8 F (36.6 C) (Oral)   Resp 18   LMP 03/31/2016  General appearance: alert, cooperative and no distress Lungs: clear to auscultation bilaterally Heart: regular rate and rhythm Abdomen: soft, non-tender; bowel sounds normal Extremities: Homans sign is negative, no sign of DVT, edema DTR's normal Presentation: cephalic Fetal monitoringBaseline: 135 bpm, Variability: Good {> 6 bpm), Accelerations: Reactive and Decelerations: Variable:  mild Uterine activityFrequency: Every 5 minutes Dilation: 1 Effacement (%): Thick Station: -3 Exam by:: Lanice Shirts RN    Prenatal labs: ABO, Rh: AB/Positive/-- (07/20 1041) Antibody: Negative (07/20 1041) Rubella: !Error! RPR: Non Reactive (11/16 1040)  HBsAg: Negative (07/20 1041)  HIV: Non Reactive (11/16 1040)  GBS:   Neg 1 hr Glucola normal Genetic screening  normal Anatomy US normal   Prenatal Transfer Tool  Maternal Diabetes: No Genetic Screening: Normal Maternal Ultrasounds/Referrals: Normal Fetal Ultrasounds or other Referrals:  None Maternal Substance Abuse:  No Significant Maternal Medications:  None Significant Maternal Lab Results: Lab values include: Group B Strep negative  Results for orders placed or performed during the hospital encounter of 01/05/17 (from the past 24 hour(s))  POCT fern test   Collection Time: 01/05/17  1:19 AM  Result Value Ref Range   POCT Fern Test Negative = intact amniotic membranes   Amnisure rupture of membrane (rom)not at [facility]   Collection Time: 01/05/17  1:45 AM  Result Value Ref Range   Amnisure ROM POSITIVE     Patient Active Problem List   Diagnosis Date Noted  . Supervision of normal pregnancy 07/11/2016  . 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 Mood disorder (HCC) 12/02/2012  . IDA (iron deficiency anemia) 07/16/2012  . Vitamin D deficiency 07/16/2012    Assessment: Ticia Virgo is a 26 y.o. G1P0000 at [redacted]w[redacted]d here for SROM  #Labor: Expectant management, patient would like water birth. Holding off on any augmentation at this time #Pain: Nitrous oxide during active labor, unsure if this can be done with water birth. Will clarify with Dr. Genevie Ann #FWB: Cat 1 #ID: GBS neg #MOF: breast #MOC: none #Circ:  N/A  Anders Simmonds, MD [facility], PGY-2     OB FELLOW HISTORY AND PHYSICAL ATTESTATION  I have seen and examined this patient; I agree with above documentation in the resident's note.    Patient plans for water birth. Will plan for midwife to reassess this morning. Not having many contractions may need cytotec.   Ernestina Penna 01/05/2017, 6:51 AM

## 2017-01-05 NOTE — Progress Notes (Signed)
Patient ID: Shelby Graves, female   DOB: 06/10/1991, 26 y.o.   MRN: [id number] S: pt refuses any type of monitoring for herself or fetus. Also refuses any augmentation even though she has been ruptured over 12 hours with no labor. O: VSS, lengthy discussion with pt regarding risk of refusing treatment including infection and fetal death. A: SROM @ 39.1 no labor P: will augment labor when pt is in agreement to do so.

## 2017-01-05 NOTE — Progress Notes (Signed)
Shelby Graves is a 26 y.o. G1P0000 at 3129w1d by ultrasound admitted for rupture of membranes  Subjective:   Objective: BP 109/72   Pulse 77   Temp 97.7 F (36.5 C) (Oral)   Resp 16   Ht 5\' 5"  (1.651 m)   Wt 170 lb (77.1 kg)   LMP 03/31/2016   SpO2 100%   BMI 28.29 kg/m  No intake/output data recorded. No intake/output data recorded.  FHT:  FHR: 130 bpm, variability: moderate,  accelerations:  Present,  decelerations:  Absent UC:   regular, every 5 minutes SVE:   Dilation: 3.5 Effacement (%): 50 Station: -3 Exam by:: m hoppenbauer rn   Labs: Lab Results  Component Value Date   WBC 8.0 01/05/2017   HGB 11.8 (L) 01/05/2017   HCT 32.3 (L) 01/05/2017   MCV 80.3 01/05/2017   PLT 204 01/05/2017    Assessment / Plan: Augmentation of labor, progressing well  Labor: Progressing normally Preeclampsia:  no signs or symptoms of toxicity, intake and ouput balanced and labs stable Fetal Wellbeing:  Category I Pain Control:  Labor support without medications I/D:  n/a Anticipated MOD:  NSVD  Shelby Graves 01/05/2017, 7:31 PM

## 2017-01-05 NOTE — MAU Note (Signed)
Patient presents to MAU with complaints of water broke. Patient states that water broke at 0023. Patient states she has bloody mucus when she wipes. Positive fetal movement.

## 2017-01-06 DIAGNOSIS — O4202 Full-term premature rupture of membranes, onset of labor within 24 hours of rupture: Secondary | ICD-10-CM

## 2017-01-06 DIAGNOSIS — Z3A39 39 weeks gestation of pregnancy: Secondary | ICD-10-CM

## 2017-01-06 MED ORDER — SODIUM CHLORIDE 0.9 % IV SOLN: 250.0000 mL | INTRAVENOUS | Status: DC | PRN

## 2017-01-06 MED ORDER — PRENATAL MULTIVITAMIN CH
1.0000 | ORAL_TABLET | Freq: Every day | ORAL | Status: DC
  Filled 2017-01-06 (×2): qty 1

## 2017-01-06 MED ORDER — SODIUM CHLORIDE 0.9% FLUSH: 3.0000 mL | INTRAVENOUS | Status: DC | PRN

## 2017-01-06 MED ORDER — DIBUCAINE 1 % RE OINT
1.0000 "application " | TOPICAL_OINTMENT | RECTAL | Status: DC | PRN
  Filled 2017-01-06: qty 28

## 2017-01-06 MED ORDER — WITCH HAZEL-GLYCERIN EX PADS: 1.0000 "application " | MEDICATED_PAD | CUTANEOUS | Status: DC | PRN

## 2017-01-06 MED ORDER — SODIUM CHLORIDE 0.9% FLUSH: 3.0000 mL | Freq: Two times a day (BID) | INTRAVENOUS | Status: DC

## 2017-01-06 MED ORDER — COCONUT OIL OIL
1.0000 "application " | TOPICAL_OIL | Status: DC | PRN
  Administered 2017-01-07: 1 via TOPICAL
  Filled 2017-01-06: qty 120

## 2017-01-06 MED ORDER — SIMETHICONE 80 MG PO CHEW: 80.0000 mg | CHEWABLE_TABLET | ORAL | Status: DC | PRN

## 2017-01-06 MED ORDER — ONDANSETRON HCL 4 MG/2ML IJ SOLN: 4.0000 mg | INTRAMUSCULAR | Status: DC | PRN

## 2017-01-06 MED ORDER — MEASLES, MUMPS & RUBELLA VAC ~~LOC~~ INJ
0.5000 mL | INJECTION | Freq: Once | SUBCUTANEOUS | Status: DC
  Filled 2017-01-06: qty 0.5

## 2017-01-06 MED ORDER — ZOLPIDEM TARTRATE 5 MG PO TABS: 5.0000 mg | ORAL_TABLET | Freq: Every evening | ORAL | Status: DC | PRN

## 2017-01-06 MED ORDER — ACETAMINOPHEN 325 MG PO TABS
650.0000 mg | ORAL_TABLET | ORAL | Status: DC | PRN
  Administered 2017-01-08 (×2): 650 mg via ORAL
  Filled 2017-01-06 (×2): qty 2

## 2017-01-06 MED ORDER — IBUPROFEN 600 MG PO TABS
600.0000 mg | ORAL_TABLET | Freq: Four times a day (QID) | ORAL | Status: DC
  Administered 2017-01-06 – 2017-01-08 (×7): 600 mg via ORAL
  Filled 2017-01-06 (×9): qty 1

## 2017-01-06 MED ORDER — ONDANSETRON HCL 4 MG PO TABS: 4.0000 mg | ORAL_TABLET | ORAL | Status: DC | PRN

## 2017-01-06 MED ORDER — TETANUS-DIPHTH-ACELL PERTUSSIS 5-2.5-18.5 LF-MCG/0.5 IM SUSP: 0.5000 mL | Freq: Once | INTRAMUSCULAR | Status: DC

## 2017-01-06 MED ORDER — SENNOSIDES-DOCUSATE SODIUM 8.6-50 MG PO TABS
2.0000 | ORAL_TABLET | ORAL | Status: DC
  Administered 2017-01-08: 2 via ORAL
  Filled 2017-01-06 (×2): qty 2

## 2017-01-06 MED ORDER — DIPHENHYDRAMINE HCL 25 MG PO CAPS: 25.0000 mg | ORAL_CAPSULE | Freq: Four times a day (QID) | ORAL | Status: DC | PRN

## 2017-01-06 MED ORDER — BENZOCAINE-MENTHOL 20-0.5 % EX AERO
1.0000 "application " | INHALATION_SPRAY | CUTANEOUS | Status: DC | PRN
  Administered 2017-01-06: 1 via TOPICAL
  Filled 2017-01-06: qty 56

## 2017-01-06 NOTE — Lactation Note (Signed)
This note was copied from a baby's chart. Lactation Consultation Note  Patient Name: Shelby Graves [id number]'WToday's Date: 01/06/2017 Reason for consult: Follow-up assessment  Follow up visit at 21 hours of age.  Mom reports baby is sleepy and only latched for few minutes at a feeding.  Baby has had 2 stools no void.  Mom first avoids eye contact and short with answers, mom then warmed up and more engaged in visit. Baby is asleep next to mom.  LC assisted with hand expression of just a drop at each breast. Lc assisted with waking technique and latching baby.  Baby latched with wide gape for a few minutes with stimulation needed for sucking.  LC encouraged mom to keep baby STS and feed with early cues on demand.   LC discussed normal feeding behavior and cluster feedings.  LC discussed maybe using a DEBP in the morning if baby is not feeding better.  LC assisted with set up of hand pump with instructions. Mom to call for assist as needed. [facility] given and discussed.    Maternal Data    Feeding Feeding Type: Breast Fed Length of feed:  (few minutes)  LATCH Score/Interventions Latch: Repeated attempts needed to sustain latch, nipple held in mouth throughout feeding, stimulation needed to elicit sucking reflex. Intervention(s): Skin to skin;Teach feeding cues;Waking techniques Intervention(s): Breast massage;Breast compression  Audible Swallowing: None Intervention(s): Hand expression  Type of Nipple: Flat (semi flat)  Comfort (Breast/Nipple): Soft / non-tender (slight bruising to left nipple)  Problem noted: Mild/Moderate discomfort Interventions (Mild/moderate discomfort): Hand expression  Hold (Positioning): Assistance needed to correctly position infant at breast and maintain latch. Intervention(s): Breastfeeding basics reviewed;Support Pillows;Position options;Skin to skin  LATCH Score: 5  Lactation Tools Discussed/Used     Consult Status Consult Status:  Follow-up Date: 01/07/17 Follow-up type: In-patient    Shelby Graves, Shelby Graves 01/06/2017, 10:18 PM

## 2017-01-06 NOTE — Lactation Note (Addendum)
This note was copied from a baby's chart. Lactation Consultation Note New mom exhausted from 20 hrs of labor. RN reported to have heavy blood flow. RN called LC d/t baby fussy, hungry, unable to latch to mom's breast, unable to obtain colostrum. At 4 hrs of age, MGM holding baby, mom sleeping off and on. Baby wasn't fussy or rooting at this time.  Mom's breast are very tender, couldn't hardly tolerate RN or LC hand expressing. States nipples are sore as well from baby trying to BF.  Mom is thin, has tubular breast w/thin breast tissue. Very soft w/not much substance for breast tissue, can feel the ducts or fat cells in breast. Has semi flat nipples. LC can compress Rt. Nipple more than left w/stimulation. Mom appears to be very sensitive and can't tolerate much stimulation to breast at this time. Mom stated she had a little change in breast, Lt. Breast would leak occasionally. Rt. Breast did not. Hand expression taught, w/2 very thick drops of colostrum from Lt. Breast. No colostrum obtained from Rt. Breast. With mom's sensitivity LC was going to fit mom w/NS d/t semi flat. #16, #20 NS took into rm. Mom sound a sleep when returned. Shells and hand pump as well left in rm.  LC told MGM that mom should rest, LC would visit at later time. Call staff if has questions or needs assistance. Encouraged STS and I&O.  Left LC brochure but not reviewed.  Noted baby has a wide mouth w/recessed chin. Moves tongue well outside of mouth.  Patient Name: Shelby Graves WUJWJ'XToday's Date: 01/06/2017 Reason for consult: Initial assessment   Maternal Data Has patient been taught Hand Expression?: Yes Does the patient have breastfeeding experience prior to this delivery?: No  Feeding    LATCH Score/Interventions Latch: Too sleepy or reluctant, no latch achieved, no sucking elicited.     Type of Nipple: Everted at rest and after stimulation (very short shaft)  Comfort (Breast/Nipple): Filling, red/small  blisters or bruises, mild/mod discomfort (breast and nipples very tender)  Problem noted: Mild/Moderate discomfort Interventions (Mild/moderate discomfort): Hand massage;Hand expression  Hold (Positioning): Full assist, staff holds infant at breast Intervention(s): Support Pillows;Position options;Skin to skin     Lactation Tools Discussed/Used Tools: Shells;Pump;Nipple Shields Nipple shield size: 16;20 Shell Type: Inverted Breast pump type: Manual Initiated by:: LC Date initiated:: 01/06/17   Consult Status Consult Status: Follow-up Date: 01/06/17 Follow-up type: In-patient    Charyl DancerCARVER, Criston Chancellor G 01/06/2017, 7:49 AM

## 2017-01-07 MED ORDER — OXYCODONE HCL 5 MG PO TABS
5.0000 mg | ORAL_TABLET | Freq: Once | ORAL | Status: AC
  Administered 2017-01-07: 5 mg via ORAL
  Filled 2017-01-07: qty 1

## 2017-01-07 NOTE — Lactation Note (Signed)
This note was copied from a baby's chart. Lactation Consultation Note  Patient Name: Shelby Graves ZOXWR'UToday's Date: 01/07/2017   P1, Baby 33 hours old.  Mother's nipples are sore.  Abrasion on tip of L nipple. Baby had disorganized suck, some tongue humping and cupping. Labial frenulum is short.  Mother's states when baby latches she is having to flange top and bottom lip. Mother started pumping with DEBP due to soreness.  L nipple was so sore she had to stop pumping. Mother pumped approx 5 ml of colostrum. Provided mother with coconut oil and shells to wear.  Encouraged also applying ebm. Taught mother and grandmother how to do suck training and finger syringe feed.  Discussed also spoon feeding. Baby took approx 3 ml and fell asleep. Recommend lubricating pump flange with coconut oil before next pumping session. Suggest she compress breast when latching and skip L breast next feeding if too sore and pump. Encouraged her to call for help.       Maternal Data    Feeding Feeding Type: Breast Fed Length of feed: 15 min  LATCH Score/Interventions                      Lactation Tools Discussed/Used     Consult Status      Dahlia ByesBerkelhammer, Ruth Boschen 01/07/2017, 11:00 AM

## 2017-01-07 NOTE — Lactation Note (Signed)
This note was copied from a baby's chart. Lactation Consultation Note  Patient Name: Shelby Graves's Date: 01/07/2017 Reason for consult: Follow-up assessment Baby at 42 hr of life. Mom is pumping to feed. RN wanted lactation to talk to mom about either pumping and bottle feeding or putting back to the breast. Mom desires to pump and finger feed at this time. She wants to put baby back tot he breast "after she learns to suck". Mom thinks that finger feeding is teaching baby to suck. Offer latch help and mom declined because "I need to eat right then I will feed her". Mom will call for lactation at the next feeding.   Maternal Data    Feeding Feeding Type: Breast Milk  LATCH Score/Interventions                      Lactation Tools Discussed/Used     Consult Status Consult Status: Follow-up Date: 01/08/17 Follow-up type: In-patient    [name] 01/07/2017, 7:19 PM

## 2017-01-07 NOTE — Progress Notes (Signed)
Called due to patient reporting worsening pain that was not relieved with ibuprofen. The patient was requesting percocet. Went to evaluate the patient. She is reporting crampy abdominal and lower back pain as well as pelvic discomfort at the site of her recent 2nd degree vaginal laceration.   Will provide one time dose of oxytocin and encouraged patient to continue with NSAIDs when possible as this would likely provide more relief.

## 2017-01-08 ENCOUNTER — Encounter: Payer: Self-pay | Admitting: Certified Nurse Midwife

## 2017-01-08 DIAGNOSIS — R2232 Localized swelling, mass and lump, left upper limb: Secondary | ICD-10-CM

## 2017-01-08 MED ORDER — IBUPROFEN 600 MG PO TABS: 600.0000 mg | ORAL_TABLET | Freq: Four times a day (QID) | ORAL | 0 refills | Status: DC | PRN

## 2017-01-08 NOTE — Discharge Instructions (Signed)
Postpartum Care After Vaginal Delivery  The period of time right after you deliver your newborn is called the postpartum period.  What kind of medical care will I receive?  · You may continue to receive fluids and medicines through an IV tube inserted into one of your veins.  · If an incision was made near your vagina (episiotomy) or if you had some vaginal tearing during delivery, cold compresses may be placed on your episiotomy or your tear. This helps to reduce pain and swelling.  · You may be given a squirt bottle to use when you go to the bathroom. You may use this until you are comfortable wiping as usual. To use the squirt bottle, follow these steps:  ? Before you urinate, fill the squirt bottle with warm water. Do not use hot water.  ? After you urinate, while you are sitting on the toilet, use the squirt bottle to rinse the area around your urethra and vaginal opening. This rinses away any urine and blood.  ? You may do this instead of wiping. As you start healing, you may use the squirt bottle before wiping yourself. Make sure to wipe gently.  ? Fill the squirt bottle with clean water every time you use the bathroom.  · You will be given sanitary pads to wear.  How can I expect to feel?  · You may not feel the need to urinate for several hours after delivery.  · You will have some soreness and pain in your abdomen and vagina.  · If you are breastfeeding, you may have uterine contractions every time you breastfeed for up to several weeks postpartum. Uterine contractions help your uterus return to its normal size.  · It is normal to have vaginal bleeding (lochia) after delivery. The amount and appearance of lochia is often similar to a menstrual period in the first week after delivery. It will gradually decrease over the next few weeks to a dry, yellow-brown discharge. For most women, lochia stops completely by 6-8 weeks after delivery. Vaginal bleeding can vary from woman to woman.  · Within the first few  days after delivery, you may have breast engorgement. This is when your breasts feel heavy, full, and uncomfortable. Your breasts may also throb and feel hard, tightly stretched, warm, and tender. After this occurs, you may have milk leaking from your breasts. Your health care provider can help you relieve discomfort due to breast engorgement. Breast engorgement should go away within a few days.  · You may feel more sad or worried than normal due to hormonal changes after delivery. These feelings should not last more than a few days. If these feelings do not go away after several days, speak with your health care provider.  How should I care for myself?  · Tell your health care provider if you have pain or discomfort.  · Drink enough water to keep your urine clear or pale yellow.  · Wash your hands thoroughly with soap and water for at least 20 seconds after changing your sanitary pads, after using the toilet, and before holding or feeding your baby.  · If you are not breastfeeding, avoid touching your breasts a lot. Doing this can make your breasts produce more milk.  · If you become weak or lightheaded, or you feel like you might faint, ask for help before:  ? Getting out of bed.  ? Showering.  · Change your sanitary pads frequently. Watch for any changes in your flow, such as   a sudden increase in volume, a change in color, the passing of large blood clots. If you pass a blood clot from your vagina, save it to show to your health care provider. Do not flush blood clots down the toilet without having your health care provider look at them.  · Make sure that all your vaccinations are up to date. This can help protect you and your baby from getting certain diseases. You may need to have immunizations done before you leave the hospital.  · If desired, talk with your health care provider about methods of family planning or birth control (contraception).  How can I start bonding with my baby?  Spending as much time as  possible with your baby is very important. During this time, you and your baby can get to know each other and develop a bond. Having your baby stay with you in your room (rooming in) can give you time to get to know your baby. Rooming in can also help you become comfortable caring for your baby. Breastfeeding can also help you bond with your baby.  How can I plan for returning home with my baby?  · Make sure that you have a car seat installed in your vehicle.  ? Your car seat should be checked by a certified car seat installer to make sure that it is installed safely.  ? Make sure that your baby fits into the car seat safely.  · Ask your health care provider any questions you have about caring for yourself or your baby. Make sure that you are able to contact your health care provider with any questions after leaving the hospital.  This information is not intended to replace advice given to you by your health care provider. Make sure you discuss any questions you have with your health care provider.  Document Released: 10/06/2007 Document Revised: 05/13/2016 Document Reviewed: 11/13/2015  [employer]vier Interactive Patient Education © 2017 [employer]    Breastfeeding  Deciding to breastfeed is one of the best choices you can make for you and your baby. A change in hormones during pregnancy causes your breast tissue to grow and increases the number and size of your milk ducts. These hormones also allow proteins, sugars, and fats from your blood supply to make breast milk in your milk-producing glands. Hormones prevent breast milk from being released before your baby is born as well as prompt milk flow after birth. Once breastfeeding has begun, thoughts of your baby, as well as his or her sucking or crying, can stimulate the release of milk from your milk-producing glands.  Benefits of breastfeeding  For Your Baby  · Your first milk (colostrum) helps your baby's digestive system function better.  · There are antibodies in  your milk that help your baby fight off infections.  · Your baby has a lower incidence of asthma, allergies, and sudden infant death syndrome.  · The nutrients in breast milk are better for your baby than infant formulas and are designed uniquely for your baby’s needs.  · Breast milk improves your baby's brain development.  · Your baby is less likely to develop other conditions, such as childhood obesity, asthma, or type 2 diabetes mellitus.    For You  · Breastfeeding helps to create a very special bond between you and your baby.  · Breastfeeding is convenient. Breast milk is always available at the correct temperature and costs nothing.  · Breastfeeding helps to burn calories and helps you lose the weight   gained during pregnancy.  · Breastfeeding makes your uterus contract to its prepregnancy size faster and slows bleeding (lochia) after you give birth.  · Breastfeeding helps to lower your risk of developing type 2 diabetes mellitus, osteoporosis, and breast or ovarian cancer later in life.    Signs that your baby is hungry  Early Signs of Hunger  · Increased alertness or activity.  · Stretching.  · Movement of the head from side to side.  · Movement of the head and opening of the mouth when the corner of the mouth or cheek is stroked (rooting).  · Increased sucking sounds, smacking lips, cooing, sighing, or squeaking.  · Hand-to-mouth movements.  · Increased sucking of fingers or hands.    Late Signs of Hunger  · Fussing.  · Intermittent crying.    Extreme Signs of Hunger   Signs of extreme hunger will require calming and consoling before your baby will be able to breastfeed successfully. Do not wait for the following signs of extreme hunger to occur before you initiate breastfeeding:  · Restlessness.  · A loud, strong cry.  · Screaming.    Breastfeeding basics   Breastfeeding Initiation  · Find a comfortable place to sit or lie down, with your neck and back well supported.  · Place a pillow or rolled up blanket  under your baby to bring him or her to the level of your breast (if you are seated). Nursing pillows are specially designed to help support your arms and your baby while you breastfeed.  · Make sure that your baby's abdomen is facing your abdomen.  · Gently massage your breast. With your fingertips, massage from your chest wall toward your nipple in a circular motion. This encourages milk flow. You may need to continue this action during the feeding if your milk flows slowly.  · Support your breast with 4 fingers underneath and your thumb above your nipple. Make sure your fingers are well away from your nipple and your baby’s mouth.  · Stroke your baby's lips gently with your finger or nipple.  · When your baby's mouth is open wide enough, quickly bring your baby to your breast, placing your entire nipple and as much of the colored area around your nipple (areola) as possible into your baby's mouth.  ? More areola should be visible above your baby's upper lip than below the lower lip.  ? Your baby's tongue should be between his or her lower gum and your breast.  · Ensure that your baby's mouth is correctly positioned around your nipple (latched). Your baby's lips should create a seal on your breast and be turned out (everted).  · It is common for your baby to suck about 2-3 minutes in order to start the flow of breast milk.    Latching   Teaching your baby how to latch on to your breast properly is very important. An improper latch can cause nipple pain and decreased milk supply for you and poor weight gain in your baby. Also, if your baby is not latched onto your nipple properly, he or she may swallow some air during feeding. This can make your baby fussy. Burping your baby when you switch breasts during the feeding can help to get rid of the air. However, teaching your baby to latch on properly is still the best way to prevent fussiness from swallowing air while breastfeeding.  Signs that your baby has  successfully latched on to your nipple:  · Silent   tugging or silent sucking, without causing you pain.  · Swallowing heard between every 3-4 sucks.  · Muscle movement above and in front of his or her ears while sucking.    Signs that your baby has not successfully latched on to nipple:  · Sucking sounds or smacking sounds from your baby while breastfeeding.  · Nipple pain.    If you think your baby has not latched on correctly, slip your finger into the corner of your baby’s mouth to break the suction and place it between your baby's gums. Attempt breastfeeding initiation again.  Signs of Successful Breastfeeding   Signs from your baby:  · A gradual decrease in the number of sucks or complete cessation of sucking.  · Falling asleep.  · Relaxation of his or her body.  · Retention of a small amount of milk in his or her mouth.  · Letting go of your breast by himself or herself.    Signs from you:  · Breasts that have increased in firmness, weight, and size 1-3 hours after feeding.  · Breasts that are softer immediately after breastfeeding.  · Increased milk volume, as well as a change in milk consistency and color by the fifth day of breastfeeding.  · Nipples that are not sore, cracked, or bleeding.    Signs That Your Baby is Getting Enough Milk  · Wetting at least 1-2 diapers during the first 24 hours after birth.  · Wetting at least 5-6 diapers every 24 hours for the first week after birth. The urine should be clear or pale yellow by 5 days after birth.  · Wetting 6-8 diapers every 24 hours as your baby continues to grow and develop.  · At least 3 stools in a 24-hour period by age 5 days. The stool should be soft and yellow.  · At least 3 stools in a 24-hour period by age 7 days. The stool should be seedy and yellow.  · No loss of weight greater than 10% of birth weight during the first 3 days of age.  · Average weight gain of 4-7 ounces (113-198 g) per week after age 4 days.  · Consistent daily weight gain by age 5  days, without weight loss after the age of 2 weeks.    After a feeding, your baby may spit up a small amount. This is common.  Breastfeeding frequency and duration  Frequent feeding will help you make more milk and can prevent sore nipples and breast engorgement. Breastfeed when you feel the need to reduce the fullness of your breasts or when your baby shows signs of hunger. This is called "breastfeeding on demand." Avoid introducing a pacifier to your baby while you are working to establish breastfeeding (the first 4-6 weeks after your baby is born). After this time you may choose to use a pacifier. Research has shown that pacifier use during the first year of a baby's life decreases the risk of sudden infant death syndrome (SIDS).  Allow your baby to feed on each breast as long as he or she wants. Breastfeed until your baby is finished feeding. When your baby unlatches or falls asleep while feeding from the first breast, offer the second breast. Because newborns are often sleepy in the first few weeks of life, you may need to awaken your baby to get him or her to feed.  Breastfeeding times will vary from baby to baby. However, the following rules can serve as a guide to help you ensure   that your baby is properly fed:  · Newborns (babies 4 weeks of age or younger) may breastfeed every 1-3 hours.  · Newborns should not go longer than 3 hours during the day or 5 hours during the night without breastfeeding.  · You should breastfeed your baby a minimum of 8 times in a 24-hour period until you begin to introduce solid foods to your baby at around 6 months of age.    Breast milk pumping  Pumping and storing breast milk allows you to ensure that your baby is exclusively fed your breast milk, even at times when you are unable to breastfeed. This is especially important if you are going back to work while you are still breastfeeding or when you are not able to be present during feedings. Your lactation consultant can give  you guidelines on how long it is safe to store breast milk.  A breast pump is a machine that allows you to pump milk from your breast into a sterile bottle. The pumped breast milk can then be stored in a refrigerator or freezer. Some breast pumps are operated by hand, while others use electricity. Ask your lactation consultant which type will work best for you. Breast pumps can be purchased, but some hospitals and breastfeeding support groups lease breast pumps on a monthly basis. A lactation consultant can teach you how to hand express breast milk, if you prefer not to use a pump.  Caring for your breasts while you breastfeed  Nipples can become dry, cracked, and sore while breastfeeding. The following recommendations can help keep your breasts moisturized and healthy:  · Avoid using soap on your nipples.  · Wear a supportive bra. Although not required, special nursing bras and tank tops are designed to allow access to your breasts for breastfeeding without taking off your entire bra or top. Avoid wearing underwire-style bras or extremely tight bras.  · Air dry your nipples for 3-4 minutes after each feeding.  · Use only cotton bra pads to absorb leaked breast milk. Leaking of breast milk between feedings is normal.  · Use lanolin on your nipples after breastfeeding. Lanolin helps to maintain your skin's normal moisture barrier. If you use pure lanolin, you do not need to wash it off before feeding your baby again. Pure lanolin is not toxic to your baby. You may also hand express a few drops of breast milk and gently massage that milk into your nipples and allow the milk to air dry.    In the first few weeks after giving birth, some women experience extremely full breasts (engorgement). Engorgement can make your breasts feel heavy, warm, and tender to the touch. Engorgement peaks within 3-5 days after you give birth. The following recommendations can help ease engorgement:  · Completely empty your breasts while  breastfeeding or pumping. You may want to start by applying warm, moist heat (in the shower or with warm water-soaked hand towels) just before feeding or pumping. This increases circulation and helps the milk flow. If your baby does not completely empty your breasts while breastfeeding, pump any extra milk after he or she is finished.  · Wear a snug bra (nursing or regular) or tank top for 1-2 days to signal your body to slightly decrease milk production.  · Apply ice packs to your breasts, unless this is too uncomfortable for you.  · Make sure that your baby is latched on and positioned properly while breastfeeding.    If engorgement persists after 48 hours   of following these recommendations, contact your health care provider or a lactation consultant.  Overall health care recommendations while breastfeeding  · Eat healthy foods. Alternate between meals and snacks, eating 3 of each per day. Because what you eat affects your breast milk, some of the foods may make your baby more irritable than usual. Avoid eating these foods if you are sure that they are negatively affecting your baby.  · Drink milk, fruit juice, and water to satisfy your thirst (about 10 glasses a day).  · Rest often, relax, and continue to take your prenatal vitamins to prevent fatigue, stress, and anemia.  · Continue breast self-awareness checks.  · Avoid chewing and smoking tobacco. Chemicals from cigarettes that pass into breast milk and exposure to secondhand smoke may harm your baby.  · Avoid alcohol and drug use, including marijuana.  Some medicines that may be harmful to your baby can pass through breast milk. It is important to ask your health care provider before taking any medicine, including all over-the-counter and prescription medicine as well as vitamin and herbal supplements.  It is possible to become pregnant while breastfeeding. If birth control is desired, ask your health care provider about options that will be safe for your  baby.  Contact a health care provider if:  · You feel like you want to stop breastfeeding or have become frustrated with breastfeeding.  · You have painful breasts or nipples.  · Your nipples are cracked or bleeding.  · Your breasts are red, tender, or warm.  · You have a swollen area on either breast.  · You have a fever or chills.  · You have nausea or vomiting.  · You have drainage other than breast milk from your nipples.  · Your breasts do not become full before feedings by the fifth day after you give birth.  · You feel sad and depressed.  · Your baby is too sleepy to eat well.  · Your baby is having trouble sleeping.  · Your baby is wetting less than 3 diapers in a 24-hour period.  · Your baby has less than 3 stools in a 24-hour period.  · Your baby's skin or the white part of his or her eyes becomes yellow.  · Your baby is not gaining weight by 5 days of age.  Get help right away if:  · Your baby is overly tired (lethargic) and does not want to wake up and feed.  · Your baby develops an unexplained fever.  This information is not intended to replace advice given to you by your health care provider. Make sure you discuss any questions you have with your health care provider.  Document Released: 12/09/2005 Document Revised: 05/22/2016 Document Reviewed: 06/02/2013  [employer]vier Interactive Patient Education © 2017 [employer]

## 2017-01-08 NOTE — Lactation Note (Signed)
This note was copied from a baby'Graves chart. Lactation Consultation Note  Patient Name: Shelby Graves [name] Date: 01/08/2017  Follow up visit made.  Mom states her breasts are full and baby is latching well.  Discharge teaching done including engorgement treatment.  Questions answered.  Reviewed outpatient lactation services and support and encouraged prn.   Maternal Data    Feeding Feeding Type: Breast Fed Length of feed: 20 min  LATCH Score/Interventions Latch: Repeated attempts needed to sustain latch, nipple held in mouth throughout feeding, stimulation needed to elicit sucking reflex.  Audible Swallowing: None  Type of Nipple: Everted at rest and after stimulation  Comfort (Breast/Nipple): Soft / non-tender     Hold (Positioning): No assistance needed to correctly position infant at breast.  LATCH Score: 7  Lactation Tools Discussed/Used     Consult Status      Shelby Graves, Shelby Graves 01/08/2017, 10:20 AM

## 2017-01-08 NOTE — Discharge Summary (Signed)
OB Discharge Summary     Patient Name: Shelby Graves DOB: 03/08/1991 MRN: [id number]  Date of admission: 01/05/2017 Delivering MD: Wyvonnia DuskyLAWSON, MARIE D   Date of discharge: 01/08/2017  Admitting diagnosis: 39 WEEKS WATER BROKE Intrauterine pregnancy: 663w4d     Secondary diagnosis:  Active Problems:   Normal labor   Axillary mass, left  Additional problems:Lt axillary mass (see notes below)     Discharge diagnosis: Term Pregnancy Delivered  waterbirth                                                                                              Post partum procedures:none  Augmentation: Pitocin  Complications: None  Hospital course:  Onset of Labor With Vaginal Delivery     26 y.o. yo G1P0000 at 6563w4d was admitted for PROM on 01/05/2017. Received pitocin augmentation. Patient had an uncomplicated labor course as follows: waterbirth Membrane Rupture Time/Date: 12:23 AM ,01/05/2017   Intrapartum Procedures: Episiotomy: None [1]                                         Lacerations:  2nd degree [3]  Patient had a delivery of a Viable infant. 01/06/2017  Information for the patient's newborn:  Humberto SealsMitchell, Girl Abbigale [[id number]][[id number]]  Delivery Method: Vaginal, Spontaneous Delivery (Filed from Delivery Summary)    Pateint had an uncomplicated postpartum course.  She is ambulating, tolerating a regular diet, passing flatus, and urinating well. Patient is discharged home in stable condition on 01/08/17. Reports mass under Lt axilla, was there during pregnancy but much smaller, was felt to be a lymph node, noticed it was much larger this am    Physical exam Vitals:   01/06/17 1801 01/07/17 0500 01/07/17 1848 01/08/17 0544  BP: 94/60 (!) 96/58 (!) 93/46 113/71  Pulse: 89 71 88 82  Resp: 18 18 18 18   Temp: 98 F (36.7 C) 98.6 F (37 C) 98 F (36.7 C) 97.6 F (36.4 C)  TempSrc: Axillary Oral Oral Oral  SpO2:      Weight:      Height:       General: alert, cooperative and no distress  Lt  axilla: ~5cm firm mobile mass, non-tender Lochia: appropriate Uterine Fundus: firm Incision: N/A DVT Evaluation: No evidence of DVT seen on physical exam. Negative Homan's sign. No cords or calf tenderness. No significant calf/ankle edema. Labs: Lab Results  Component Value Date   WBC 8.0 01/05/2017   HGB 11.8 (L) 01/05/2017   HCT 32.3 (L) 01/05/2017   MCV 80.3 01/05/2017   PLT 204 01/05/2017   CMP Latest Ref Rng & Units 07/16/2012  Glucose 70 - 99 mg/dL 72  BUN 6 - 23 mg/dL 12  Creatinine 7.820.50 - 9.561.10 mg/dL 2.130.78  Sodium 086135 - 578145 mEq/L 136  Potassium 3.5 - 5.3 mEq/L 4.0  Chloride 96 - 112 mEq/L 101  CO2 19 - 32 mEq/L 27  Calcium 8.4 - 10.5 mg/dL 9.2  Total Protein 6.0 - 8.3 g/dL 7.2  Total Bilirubin  0.3 - 1.2 mg/dL 0.5  Alkaline Phos 39 - 117 U/L 44  AST 0 - 37 U/L 17  ALT 0 - 35 U/L 12    Discharge instruction: per After Visit Summary and "Baby and Me Booklet".  After visit meds:  Allergies as of 01/08/2017      Reactions   Latex Hives      Medication List    STOP taking these medications   guaiFENesin 100 MG/5ML liquid Commonly known as:  ROBITUSSIN     TAKE these medications   albuterol 108 (90 Base) MCG/ACT inhaler Commonly known as:  PROVENTIL HFA;VENTOLIN HFA Inhale 1-2 puffs into the lungs every 6 (six) hours as needed for wheezing or shortness of breath.   ferrous sulfate 325 (65 FE) MG tablet Take 1 tablet (325 mg total) by mouth 2 (two) times daily with a meal.   ibuprofen 600 MG tablet Commonly known as:  ADVIL,MOTRIN Take 1 tablet (600 mg total) by mouth every 6 (six) hours as needed for mild pain, moderate pain or cramping.   OB COMPLETE PETITE 35-5-1-200 MG Caps Take 1 capsule by mouth daily.   pantoprazole 40 MG tablet Commonly known as:  PROTONIX Take 1 tablet (40 mg total) by mouth daily.       Diet: routine diet  Activity: Advance as tolerated. Pelvic rest for 6 weeks.   Outpatient follow up:4-6wks for postpartum visit Follow  up Appt:No future appointments. Follow up Visit:No Follow-up on file.  Postpartum contraception: Condoms  Newborn Data: Live born female  Birth Weight: 7 lb 10.2 oz (3464 g) APGAR: 8, 9  Baby Feeding: Breast Disposition:home with mother pending d/c from peds Discussed Lt axillary mass w/ Dr. Bernita Buffy, feels is breast tissue/milk glad that is enlarging now that she is postpartum and producing milk, no further work-up needed at this time.    01/08/2017 Marge Duncans, CNM

## 2017-01-09 ENCOUNTER — Ambulatory Visit: Payer: Self-pay | Admitting: Obstetrics

## 2017-01-09 ENCOUNTER — Encounter (HOSPITAL_COMMUNITY): Payer: Self-pay | Admitting: *Deleted

## 2017-01-09 ENCOUNTER — Inpatient Hospital Stay (HOSPITAL_COMMUNITY)
Admission: AD | Admit: 2017-01-09 | Discharge: 2017-01-09 | Disposition: A | Discharge status: Home / Self Care | Payer: Medicaid Other | Source: Ambulatory Visit | Attending: Obstetrics & Gynecology | Admitting: Obstetrics & Gynecology

## 2017-01-09 DIAGNOSIS — O9279 Other disorders of lactation: Secondary | ICD-10-CM | POA: Diagnosis present

## 2017-01-09 NOTE — MAU Note (Signed)
Lactation notified of pt arrival, states she will come speak to pt in MAU lobby to determine if she needs to be seen here or in lactation office.

## 2017-01-09 NOTE — Discharge Instructions (Signed)
Breast Engorgement °Breast engorgement is the overfilling of your breasts with breast milk. In the first few weeks after giving birth, you may experience breast engorgement. Although it is normal for your breasts to feel heavy, full, and uncomfortable within 3-5 days of giving birth, breast engorgement can make your breasts throb and feel hard, tightly stretched, warm, and tender. Engorgement peaks about the fifth day after you give birth. Breast engorgement can be easily treated and does not require you to stop breastfeeding. °What are the causes? °Some women delay feedings because of sore or cracked nipples, which can lead to engorgement. Cracked and sore nipples often are caused by inadequate latching (when your baby's mouth attaches to your breast to breastfeed). If your baby is latched on properly, he or she should be able to breastfeed as long as needed, without causing any pain. If you do feel pain while breastfeeding, take your baby off your breast and try again. Get help from your health care provider or a lactation consultant if you continue to have pain. °Other causes of engorgement include: °· Improper position of your baby while breastfeeding. °· Allowing too much time to pass between feedings. °· Reduction in breastfeeding because you give your baby water, juice, formula, breast milk from a bottle, or a pacifier instead of breastfeeding. °· Changes in your baby’s feeding patterns. °· Weak sucking from your baby, which causes less milk to be taken out of your breast during feedings. °· Fatigue, stress, anemia. °· Plugged milk ducts. °· A history of breast surgery. ° °What are the signs or symptoms? °If your breasts become engorged, you may experience: °· Breast swelling, tenderness, warmth, redness, or throbbing. °· Breast hardness and stretching of the skin around your breast. °· Flattening, tightening, and hardening of your nipple. °· A low-grade fever, which can be confused with a breast  infection. ° °How is this treated? °Breast engorgement should improve in 24-48 hours after following these recommendations: °· Breastfeed when you feel the need to reduce the fullness of your breasts or when your baby shows signs of hunger. This is called "breastfeeding on demand." °· Newborns (babies younger than 4 weeks) often breastfeed every 1-3 hours during the day. You may need to awaken your baby to feed if he or she is asleep at a feeding time. °· Do not allow your baby to sleep longer than 5 hours during the night without a feeding. °· Pump or hand-express breast milk before breastfeeding to soften your breast, areola, and nipple. °· Apply warm, moist heat (in the shower or with warm water-soaked hand towels) just before feeding or pumping, or massage your breast before or during breastfeeding. This increases circulation and helps your milk to flow. °· Completely empty your breasts when breastfeeding or pumping. Afterward, wear a snug bra (nursing or regular) or tank top for 1-2 days to signal your body to slightly decrease milk production. Only wear snug bras or tank tops to treat engorgement. Tight bras typically should be avoided by breastfeeding mothers. Once engorgement is relieved, return to wearing regular, loose-fitting clothes. °· Apply ice packs to your breasts to lessen the pain from engorgement and relieve swelling, unless the ice is uncomfortable for you. °· Do not delay feedings. Try to relax when it is time to feed your baby. This helps to trigger your "let-down reflex," which releases milk from your breast. °· Ensure your baby is latched on to your breast and positioned properly while breastfeeding. °· Allow your baby to remain   at your breast as long as he or she is latched on well and actively sucking. Your baby will let you know when he or she is done breastfeeding by pulling away from your breast or falling asleep. °· Avoid introducing bottles or pacifiers to your baby in the early weeks  of breastfeeding. Wait to introduce these things until after resolving any breastfeeding challenges. °· Try to pump your milk on the same schedule as when your baby would breastfeed if you are returning to work or away from home for an extended period. °· Drink plenty of fluids to avoid dehydration, which can eventually put you at greater risk of breast engorgement. ° °Contact a health care provider if: °· Engorgement lasts longer than 2 days, even after treatment. °· You have flu-like symptoms, such as a fever, chills, or body aches. °· Your breasts become increasingly red and painful. °This information is not intended to replace advice given to you by your health care provider. Make sure you discuss any questions you have with your health care provider. °Document Released: 04/05/2005 Document Revised: 05/22/2016 Document Reviewed: 06/03/2013 °Elsevier Interactive Patient Education © 2017 [employer] Inc. ° °

## 2017-01-09 NOTE — Lactation Note (Signed)
Lactation Consultation Note  Mother presented in MAU with engorged breasts.  Baby is 403 days old. Mother states she has been trying to pump with manual pump but nothing is coming out. Mother states she told Pediatrician she was having feeding difficulties but felt " pediatrician rushed to discharge". Applied ice packs bilaterally and pumped 5 ml from R side and eventually pumped 20 ml from L side. Demonstrated how to massage breasts. She states she is having difficulty latching baby and is very sore.  Nipples flat due to engorgement and pink. Noted short labial frenulum and cupped tongue possibly contributing to soreness and difficulty. Suggest mother discuss with her Pediatrician. Assisted with latching baby and mother winces in pain and baby lacks depth. Applied #24 nipple shield and baby latched on both breasts for a total of 30 min. Sucks and swallows observed and nipple shield full of transitional milk and mother feels relieved. [facility] and [facility] and mother refused stating she will call. She has not heard back from Peds for post discharge appointment for baby.  Suggest she call today. Nursery states she was [facility] [place].  [facility]. Mother was poor historian unable to recall how often and how much she has been feeding. Mom encouraged to feed baby 8-12 times/24 hours and with feeding cues and record all feedings. Wake baby after 3 hours if she has not fed. Plan is for mother to apply cabbage leaves to breasts 2-3 times a day for 10-15 min for up to 2 days to relieve engorgement. Place ice packs on top of cabbage leaves. Breastfeed baby and if breasts are still full after latching, post pump for 5-10 more minutes.  Pump both breasts. If for some reason baby is not latching, pump and give baby pumped breastmilk. If mother has no breastmilk to give baby supplement  with formula 30-60 ml per feeding. Monitor voids/stools. Reviewed milk storage and cleaning. Gave mother feeding sheets to record feedings, amounts, time and voids/stools. Informed mother of mastitis symptoms and to call OB/GYN if problems occur. Also gave her lactation dept phone number and suggest she leave message if we can help further. Mother will use 2 manual pumps at home. If using NS mother is post pump 4-6 times a day for 10-15 min and give volume back after feedings with slow flow nipple.     Patient Name: Karl Pockabitha Askin LKGMW'NToday's Date: 01/09/2017     Maternal Data    Feeding    LATCH Score/Interventions                      Lactation Tools Discussed/Used     Consult Status      Dahlia ByesBerkelhammer, Ruth [facility] 01/09/2017, 12:06 PM

## 2017-01-09 NOTE — MAU Provider Note (Signed)
MSE complete  Patient spent a lot of time with lactation and is feeling much better now. She denies any areas of focal redness, drainage or fever.   A: Engorgement   P:  Discharge home in stable condition Discussed warning signs for worsening condition Follow-up with Lactation PRN Follow-up with [facility] for 6 week PP visit Patient may return to MAU as needed or if her condition were to change or worsen  Marny LowensteinJulie N Tonni Mansour, PA-C 01/09/2017 12:26 PM

## 2017-01-15 ENCOUNTER — Encounter: Payer: Self-pay | Admitting: Certified Nurse Midwife

## 2017-02-03 ENCOUNTER — Ambulatory Visit (INDEPENDENT_AMBULATORY_CARE_PROVIDER_SITE_OTHER): Payer: Medicaid Other | Admitting: Obstetrics and Gynecology

## 2017-02-03 NOTE — Addendum Note (Signed)
Addended by: Catalina AntiguaONSTANT, Kwaku Mostafa on: 02/03/2017 02:42 PM   Modules accepted: Orders

## 2017-02-03 NOTE — Progress Notes (Signed)
Subjective:     Shelby Graves is a 26 y.o. female who presents for a postpartum visit. She is 4 weeks postpartum following a spontaneous vaginal delivery. I have fully reviewed the prenatal and intrapartum course. The delivery was at 39 gestational weeks. Outcome: spontaneous vaginal delivery. Anesthesia: local. Postpartum course has been remarkable for abdominal pain. Baby's course has been unremarkable. Baby is feeding by both breast and bottle - Similac Advance. Bleeding thin lochia. Bowel function is abnormal: constipation. Bladder function is normal. Patient is not sexually active. Contraception method is none. Postpartum depression screening: negative.     Review of Systems Pertinent items are noted in HPI.   Objective:    There were no vitals taken for this visit.  General:  alert, cooperative and no distress   Breasts:  inspection negative, no nipple discharge or bleeding, no masses or nodularity palpable  Lungs: clear to auscultation bilaterally  Heart:  regular rate and rhythm  Abdomen: soft, non-tender; bowel sounds normal; no masses,  no organomegaly   Vulva:  normal  Vagina: normal vagina, no discharge, exudate, lesion, or erythema  Cervix:  multiparous appearance  Corpus: normal size, contour, position, consistency, mobility, non-tender  Adnexa:  normal adnexa and no mass, fullness, tenderness  Rectal Exam: Not performed.        Assessment:     Normal postpartum exam. Pap smear not done at today's visit.   Plan:    1. Contraception: condoms 2. Patient is medically cleared to resume all activities 3. Follow up in: 5 months or as needed.

## 2017-02-09 ENCOUNTER — Encounter (HOSPITAL_COMMUNITY): Payer: Self-pay | Admitting: Emergency Medicine

## 2017-02-09 ENCOUNTER — Emergency Department (HOSPITAL_COMMUNITY)
Admission: EM | Admit: 2017-02-09 | Discharge: 2017-02-09 | Disposition: A | Discharge status: Home / Self Care | Payer: Medicaid Other | Attending: Emergency Medicine | Admitting: Emergency Medicine

## 2017-02-09 DIAGNOSIS — Z91040 Latex allergy status: Secondary | ICD-10-CM | POA: Diagnosis not present

## 2017-02-09 DIAGNOSIS — Z87891 Personal history of nicotine dependence: Secondary | ICD-10-CM | POA: Insufficient documentation

## 2017-02-09 DIAGNOSIS — Z79899 Other long term (current) drug therapy: Secondary | ICD-10-CM | POA: Diagnosis not present

## 2017-02-09 DIAGNOSIS — N644 Mastodynia: Secondary | ICD-10-CM

## 2017-02-09 DIAGNOSIS — J45909 Unspecified asthma, uncomplicated: Secondary | ICD-10-CM | POA: Diagnosis not present

## 2017-02-09 MED ORDER — IBUPROFEN 600 MG PO TABS: 600.0000 mg | ORAL_TABLET | Freq: Four times a day (QID) | ORAL | 0 refills | Status: DC | PRN

## 2017-02-09 NOTE — ED Provider Notes (Signed)
[facility]-EMERGENCY DEPT Provider Note   CSN: [id number] Arrival date & time: 02/09/17  1715  By signing my name below, I, Shelby Graves, attest that this documentation has been prepared under the direction and in the presence of Molson Coors BrewingJessica Ardelean PA-C . Electronically Signed: Sonum Graves, Neurosurgeoncribe. 02/09/17. 6:41 PM.  History   Chief Complaint Chief Complaint  Patient presents with  . Breast Pain    The history is provided by the patient. No language interpreter was used.     HPI Comments: Shelby Graves is a 26 y.o. female who presents to the Emergency Department complaining of gradually worsened bilateral breast and nipple pain with associated engorgement and breast warmth for the past 4 days. She reports 2 prior similar episodes; states the first episode was more breast engorgement and the second time she had mastitis. She has attempted to massage the painful area in the shower with minimal relief. She denies nausea, vomiting.   Past Medical History:  Diagnosis Date  . Anemia 2012  . Anxiety   . Arthritis   . Asthma 2009   last used inhaler 2009  . Depression   . H/O varicella   . History of gonorrhea   . Hx of chlamydia infection   . Hyperventilation   . Migraine 2010  . PID (pelvic inflammatory disease) 2012   GC and chlamydia     Patient Active Problem List   Diagnosis Date Noted  . Axillary mass, left 01/08/2017  . Normal labor 01/05/2017  . Supervision of normal pregnancy 07/11/2016  . 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 Mood disorder (HCC) 12/02/2012  . IDA (iron deficiency anemia) 07/16/2012  . Vitamin D deficiency 07/16/2012    History reviewed. No pertinent surgical history.  OB History    Gravida Para Term Preterm AB Living   1 0 0 0 0 0   SAB TAB Ectopic Multiple Live Births   0 0 0 0         Home Medications    Prior to Admission medications   Medication Sig Start Date End Date Taking? Authorizing Provider  albuterol (PROVENTIL HFA;VENTOLIN  HFA) 108 (90 Base) MCG/ACT inhaler Inhale 1-2 puffs into the lungs every 6 (six) hours as needed for wheezing or shortness of breath.     Historical Provider, MD  ferrous sulfate 325 (65 FE) MG tablet Take 1 tablet (325 mg total) by mouth 2 (two) times daily with a meal. 10/14/16   Judeth HornErin Lawrence, NP  ibuprofen (ADVIL,MOTRIN) 600 MG tablet Take 1 tablet (600 mg total) by mouth every 6 (six) hours as needed for mild pain, moderate pain or cramping. 01/08/17   Cheral MarkerKimberly R Booker, CNM  ibuprofen (ADVIL,MOTRIN) 600 MG tablet Take 1 tablet (600 mg total) by mouth every 6 (six) hours as needed. 02/09/17   Arby BarretteMarcy Pfeiffer, MD  pantoprazole (PROTONIX) 40 MG tablet Take 1 tablet (40 mg total) by mouth daily. 07/11/16   Lansdale Bingharlie Pickens, MD  Prenat-FeCbn-FeAspGl-FA-Omega (OB COMPLETE PETITE) 35-5-1-200 MG CAPS Take 1 capsule by mouth daily.    Historical Provider, MD    Family History Family History  Problem Relation Age of Onset  . Hyperlipidemia Mother   . Hypertension Mother   . Kidney disease Mother   . Hypertension Father   . Hypertension Brother   . Hyperlipidemia Brother   . Diabetes Maternal Grandmother   . Diabetes Maternal Grandfather   . Cancer Maternal Aunt [id number]    Ovarian CA    Social History Social History  Substance Use Topics  . Smoking status: Former Smoker    Start date: 09/15/2012  . Smokeless tobacco: Never Used  . Alcohol use No     Comment: none with pregnancy     Allergies   Latex   Review of Systems Review of Systems  Gastrointestinal: Negative for nausea and vomiting.  Musculoskeletal:       +breast and nipple pain     Physical Exam Updated Vital Signs BP 101/68 (BP Location: Right Arm)   Pulse 88   Temp 98.4 F (36.9 C) (Oral)   Resp 18   Wt 68.9 kg   SpO2 98%   BMI 25.29 kg/m   Physical Exam  Constitutional: She is oriented to person, place, and time. She appears well-developed and well-nourished.  Patient is afebrile, non-toxic appearing, sitting  comfortably in chair in no acute distress.   HENT:  Head: Normocephalic and atraumatic.  Cardiovascular: Normal rate, regular rhythm and normal heart sounds.   Pulmonary/Chest: Effort normal and breath sounds normal. No respiratory distress. She has no wheezes. She has no rales. Right breast exhibits tenderness. Right breast exhibits no skin change. Left breast exhibits tenderness. Left breast exhibits no skin change. There is no breast swelling.  Genitourinary: No breast discharge.  Neurological: She is alert and oriented to person, place, and time.  Skin: Skin is warm and dry.  Psychiatric: She has a normal mood and affect.  Nursing note and vitals reviewed.    ED Treatments / Results  DIAGNOSTIC STUDIES: Oxygen Saturation is 100% on RA, normal by my interpretation.    COORDINATION OF CARE: 6:41 PM Discussed treatment plan with pt at bedside and pt agreed to plan.   Labs (all labs ordered are listed, but only abnormal results are displayed) Labs Reviewed - No data to display  EKG  EKG Interpretation None       Radiology No results found.  Procedures Procedures (including critical care time)  Medications Ordered in ED Medications - No data to display   Initial Impression / Assessment and Plan / ED Course  I have reviewed the triage vital signs and the nursing notes.  Pertinent labs & imaging results that were available during my care of the patient were reviewed by me and considered in my medical decision making (see chart for details).    26 y/o presenting with breast tenderness currently breastfeeding 8 week old infant. No evidence of mastitis or cellulitis on exam. No erythema, induration, swelling, discharge. She is afebrile and well-appearing. She just completed an antibiotic course for mild mastitis this past week.  Patient was discussed with Dr. Donnald Garre who has evaluated patient and discharged her home with ibuprofen for breast tenderness and follow up with  GYN.  Discussed strict return precautions. Patient was advised to return to the emergency department if experiencing any new or worsening symptoms. Patient clearly understood instructions and agreed with discharge plan.   Final Clinical Impressions(s) / ED Diagnoses   Final diagnoses:  Patient is a currently breast-feeding mother  Breast pain    New Prescriptions Discharge Medication List as of 02/09/2017  7:41 PM    START taking these medications   Details  !! ibuprofen (ADVIL,MOTRIN) 600 MG tablet Take 1 tablet (600 mg total) by mouth every 6 (six) hours as needed., Starting Sun 02/09/2017, Print     !! - Potential duplicate medications found. Please discuss with provider.     I personally performed the services described in this documentation, which was scribed  in my presence. The recorded information has been reviewed and is accurate.    SHEREDA GRAW, PA-C 02/09/17 2005    Arby Barrette, MD 02/12/17 1136    Arby Barrette, MD 02/12/17 1140

## 2017-02-09 NOTE — ED Triage Notes (Signed)
Pt presents with 164 week old baby c/o breast tenderness. Pt was recently treated for mastitis and had 10 day course of antibiotics. Pt believes daughter has thrush and is concerned she may have an infection.

## 2017-02-09 NOTE — ED Notes (Signed)
Pt reports right  Breast pain and soreness.  Pt denies any redness. Pt reports the last time she had the same symptoms, pt had to have antibiotics.

## 2017-02-17 ENCOUNTER — Telehealth: Payer: Self-pay

## 2017-02-17 NOTE — Telephone Encounter (Signed)
Patient called in and stated that she is breastfeeding and her pediatrician prescribed oral drops for the baby, nystatin for herself and told her to get diflucan from us. Per provider, advised that she would need to get diflucan from pediatrician if he wants her to have it but to continue to use nystatin cream on nipples.

## 2017-03-07 ENCOUNTER — Telehealth: Payer: Self-pay | Admitting: Family Medicine

## 2017-03-07 NOTE — Telephone Encounter (Signed)
Got a call from the pediatrician's office that mom is at risk of pp depression. She has had her pp visit and denied depression. She states a lot has been going on now.  Exam's next week - has one on Monday. Hearing voices.  Has felt like she might harm herself or her baby. She will contract for safety. She is unwilling to schedule a visit at this time as she does not know her exam schedule. We will reach out to her on Monday at 8:30 to get her in with [place].

## 2017-03-12 ENCOUNTER — Institutional Professional Consult (permissible substitution): Payer: Self-pay

## 2017-03-19 ENCOUNTER — Ambulatory Visit (INDEPENDENT_AMBULATORY_CARE_PROVIDER_SITE_OTHER): Payer: Medicaid Other | Admitting: Clinical

## 2017-03-19 ENCOUNTER — Emergency Department (HOSPITAL_BASED_OUTPATIENT_CLINIC_OR_DEPARTMENT_OTHER)
Admission: EM | Admit: 2017-03-19 | Discharge: 2017-03-19 | Disposition: A | Discharge status: Home / Self Care | Payer: Medicaid Other | Attending: Emergency Medicine | Admitting: Emergency Medicine

## 2017-03-19 ENCOUNTER — Encounter (HOSPITAL_BASED_OUTPATIENT_CLINIC_OR_DEPARTMENT_OTHER): Payer: Self-pay

## 2017-03-19 DIAGNOSIS — J45909 Unspecified asthma, uncomplicated: Secondary | ICD-10-CM | POA: Insufficient documentation

## 2017-03-19 DIAGNOSIS — M778 Other enthesopathies, not elsewhere classified: Secondary | ICD-10-CM | POA: Diagnosis not present

## 2017-03-19 DIAGNOSIS — F4323 Adjustment disorder with mixed anxiety and depressed mood: Secondary | ICD-10-CM | POA: Diagnosis not present

## 2017-03-19 DIAGNOSIS — Z87891 Personal history of nicotine dependence: Secondary | ICD-10-CM | POA: Diagnosis not present

## 2017-03-19 DIAGNOSIS — M25531 Pain in right wrist: Secondary | ICD-10-CM | POA: Diagnosis present

## 2017-03-19 MED ORDER — PREDNISONE 20 MG PO TABS: ORAL_TABLET | ORAL | 0 refills | Status: DC

## 2017-03-19 NOTE — ED Provider Notes (Signed)
[facility] Provider Note   CSN: [id number] Arrival date & time: 03/19/17  1834  By signing my name below, I, Bing Neighbors., attest that this documentation has been prepared under the direction and in the presence of Arby Barrette, MD. Electronically signed: Bing Neighbors., ED Scribe. 03/19/17. 10:24 PM.   History   Chief Complaint Chief Complaint  Patient presents with  . Wrist Pain    HPI  Shelby Graves is a 26 y.o. female who presents to the Emergency Department complaining of mild bilateral wrist pain with onset x2 weeks. Pt states that for the past x2 weeks she has had bilateral wrist pain that she suspects is from typing for school and trying to breastfeed her daughter. Pt has taken Naproxen with no relief. She denies any other complaints.   The history is provided by the patient. No language interpreter was used.    Past Medical History:  Diagnosis Date  . Anemia 2012  . Anxiety   . Arthritis   . Asthma 2009   last used inhaler 2009  . Depression   . H/O varicella   . History of gonorrhea   . Hx of chlamydia infection   . Hyperventilation   . Migraine 2010  . PID (pelvic inflammatory disease) 2012   GC and chlamydia     Patient Active Problem List   Diagnosis Date Noted  . Axillary mass, left 01/08/2017  . Normal labor 01/05/2017  . Supervision of normal pregnancy 07/11/2016  . 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 Mood disorder (HCC) 12/02/2012  . IDA (iron deficiency anemia) 07/16/2012  . Vitamin D deficiency 07/16/2012    History reviewed. No pertinent surgical history.  OB History    Gravida Para Term Preterm AB Living   1 0 0 0 0 0   SAB TAB Ectopic Multiple Live Births   0 0 0 0         Home Medications    Prior to Admission medications   Medication Sig Start Date End Date Taking? Authorizing Provider  albuterol (PROVENTIL HFA;VENTOLIN HFA) 108 (90 Base) MCG/ACT inhaler Inhale 1-2 puffs into the  lungs every 6 (six) hours as needed for wheezing or shortness of breath.     Historical Provider, MD  ferrous sulfate 325 (65 FE) MG tablet Take 1 tablet (325 mg total) by mouth 2 (two) times daily with a meal. 10/14/16   Judeth Horn, NP  ibuprofen (ADVIL,MOTRIN) 600 MG tablet Take 1 tablet (600 mg total) by mouth every 6 (six) hours as needed for mild pain, moderate pain or cramping. 01/08/17   Cheral Marker, CNM  ibuprofen (ADVIL,MOTRIN) 600 MG tablet Take 1 tablet (600 mg total) by mouth every 6 (six) hours as needed. 02/09/17   Arby Barrette, MD  pantoprazole (PROTONIX) 40 MG tablet Take 1 tablet (40 mg total) by mouth daily. 07/11/16   [place] Bing, MD  predniSONE (DELTASONE) 20 MG tablet 3 tabs po day one, then 2 po daily x 4 days 03/19/17   Arby Barrette, MD  Prenat-FeCbn-FeAspGl-FA-Omega (OB COMPLETE PETITE) 35-5-1-200 MG CAPS Take 1 capsule by mouth daily.    Historical Provider, MD    Family History Family History  Problem Relation Age of Onset  . Hyperlipidemia Mother   . Hypertension Mother   . Kidney disease Mother   . Hypertension Father   . Hypertension Brother   . Hyperlipidemia Brother   . Diabetes Maternal Grandmother   . Diabetes Maternal  Grandfather   . Cancer Maternal Aunt [id number]    Ovarian CA    Social History Social History  Substance Use Topics  . Smoking status: Former Smoker    Start date: 09/15/2012  . Smokeless tobacco: Never Used  . Alcohol use Yes     Comment: occ     Allergies   Latex   Review of Systems Review of Systems  Constitutional: Negative for fever.  Musculoskeletal: Positive for arthralgias (bilateral wrist).      Physical Exam Updated Vital Signs BP 119/74 (BP Location: Left Arm)   Pulse 79   Temp 97.8 F (36.6 C) (Oral)   Resp 16   Ht 5\' 6"  (1.676 m)   Wt 158 lb (71.7 kg)   SpO2 99%   BMI 25.50 kg/m   Physical Exam  Constitutional: She is oriented to person, place, and time. She appears well-developed and  well-nourished. No distress.  HENT:  Head: Normocephalic and atraumatic.  Cardiovascular:  No murmur heard. Pulmonary/Chest: Effort normal. No respiratory distress.  Musculoskeletal:  Tenderness radial aspect of bilateral wrists. No erythema or swelling. Normal range of motion. Normal neurovascular exam.  Neurological: She is alert and oriented to person, place, and time. She exhibits normal muscle tone. Coordination normal.  Skin: Skin is warm and dry.  Psychiatric: She has a normal mood and affect.  Nursing note and vitals reviewed.    ED Treatments / Results   DIAGNOSTIC STUDIES: Oxygen Saturation is 99% on RA, normal by my interpretation.   COORDINATION OF CARE: 10:24 PM-Discussed next steps with pt. Pt verbalized understanding and is agreeable with the plan.    Labs (all labs ordered are listed, but only abnormal results are displayed) Labs Reviewed - No data to display  EKG  EKG Interpretation None       Radiology No results found.  Procedures Procedures (including critical care time)  Medications Ordered in ED Medications - No data to display   Initial Impression / Assessment and Plan / ED Course  I have reviewed the triage vital signs and the nursing notes.  Pertinent labs & imaging results that were available during my care of the patient were reviewed by me and considered in my medical decision making (see chart for details).      Final Clinical Impressions(s) / ED Diagnoses   Final diagnoses:  Tendonitis of both wrists    New Prescriptions New Prescriptions   PREDNISONE (DELTASONE) 20 MG TABLET    3 tabs po day one, then 2 po daily x 4 days       Arby BarretteMarcy Nelani Schmelzle, MD 03/19/17 2224

## 2017-03-19 NOTE — Patient Instructions (Signed)
My Safety Plan:   Step 1: Warning signs (thoughts, images, mood, situation, behavior) that a crisis may be developing: thoughts of harm   Step 2: Internal coping strategies: Things I can do to take my mind off my problems without contacting another person (music, relaxation technique, physical activity): start singing to get my mind off negative thoughts  Step 4: People I can ask for help:  Name, relationship, contact: Mother, [phone]  Step 5: Professionals or agencies I can contact during a crisis:  Local urgent care services:      1. 9-1-1     2. [facility] (24/7 walk-in) [address], [place], [place]     3. [facility]: Intake- [phone]/ [phone]     Suicide Prevention Lifeline Phone: [phone]  Step 6: Making the environment safe- Have friend/family member remove from home: Mom will remove * Weapons in the home * Medication in the home (including Tylenol)  Step 7: The one thing that is most important to me and worth living for is:  My baby daughter, Maggie Schwalberelli  Signature of Patient: _________________________________________________  Public house managerignature of Provider: ________________________________________________

## 2017-03-19 NOTE — BH Specialist Note (Signed)
Integrated Behavioral Health Initial Visit  MRN: [id number] Name: Shelby Graves   Session Start time: 2:30 Session End time: 3:30 Total time: 1 hour  Type of Service: Integrated Behavioral Health- Individual/Family Interpretor:No. Interpretor Name and Language: n/a   Warm Hand Off Completed.       SUBJECTIVE: Shelby Graves is a 26 y.o. female accompanied by mother and infant daughter. Patient was referred by Dr Shawnie Pons for depression. Patient reports the following symptoms/concerns: Pt states that her primary concern today is feeling depressed, with lack of sleep at night, overeating, and worrying, as she cares for her daughter and completes last semester in college; copes best by talking to her mother, playing with her daughter. Pt also concerned about finances, health insurance uncertainty, disappointed that FOB and previous friends have been uninvolved. Pt not concerned about fleeting thoughts of death, as she feels better than she did one week prior, and no current thoughts of death. Duration of problem:  Severity of problem: severe  OBJECTIVE: Mood: Appropriate and Affect: Appropriate Risk of harm to self or others: Suicidal ideation No plan to harm self or others   LIFE CONTEXT: Family and Social: Lives with infant daughter; mother staying indefinitely with patient, as needed. 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: Chief Strategy Officer w mom in past week Life Changes: Recent childbirth  GOALS ADDRESSED: Patient will reduce symptoms of: anxiety and depression and increase knowledge and/or ability of: coping skills and also: Increase healthy adjustment to current life circumstances   INTERVENTIONS: Solution-Focused Strategies, Sleep Hygiene, Psychoeducation and/or Health Education and Link to [facility]  Standardized Assessments completed: GAD-7 and PHQ 2&9 with  C-SSRS  ASSESSMENT: Patient currently experiencing Adjustment disorder with mixed anxious and depressed mood. Patient may benefit from psychoeducation and brief therapeutic intervention regarding coping with symptoms of anxiety and depression, along with community resources.  PLAN: 1. Follow up with behavioral health clinician on : One week by phone 2. Behavioral recommendations:  -Begin to take iron pills, as prescribed by medical provider -Continue to allow mother to help as much as able -Consider "wearing" baby to take pressure off wrists/arms -Consider 10 minute naps, as needed, throughout the day, to cope with sleep deprivation; use sleep app, if it helps -Consider Baby and Me, and other mom support classes, at [facility] Education Classrooms weekly -Consider [facility] as additional community resource -Consider going to DSS to talk to Medicaid case worker in person, to find out available current coverage -Follow contract for safety 3. Referral(s): Integrated Art gallery manager (In Clinic), Community Mental Health Services (LME/Outside Clinic) and Community Resources:  [employer] 4. "From scale of 1-10, how likely are you to follow plan?": 8  Rae Lips, LCSWA   Depression screen [facility] 2/9 03/19/2017 02/17/2016  Decreased Interest 1 1  Down, Depressed, Hopeless 1 1  PHQ - 2 Score 2 2  Altered sleeping 1 1  Tired, decreased energy 2 2  Change in appetite 3 0  Feeling bad or failure about yourself  1 1  Trouble concentrating 2 0  Moving slowly or fidgety/restless 1 0  Suicidal thoughts 1 0  PHQ-9 Score 13 6  Difficult doing work/chores Somewhat difficult Not difficult at all   GAD 7 : Generalized Anxiety Score 03/19/2017  Nervous, Anxious, on Edge 3  Control/stop worrying 1  Worry too much - different things 2  Trouble relaxing 3  Restless 1  Easily annoyed or irritable 2  Afraid - awful might happen 1  Total GAD 7 Score 13   Anxiety Difficulty Somewhat difficult

## 2017-03-19 NOTE — ED Triage Notes (Signed)
Pt asked how much longer, informed her of how many were in front of her and the length of stay the longest person has been, pt states she is leaving but will return to be seen, should make it back in that time.  States her mother is here to be seen too.

## 2017-03-19 NOTE — ED Triage Notes (Signed)
c/o bilat wrist pain x 2 weeks-denies injury-NAD-steady gait

## 2017-03-31 ENCOUNTER — Telehealth: Payer: Self-pay | Admitting: Clinical

## 2017-03-31 NOTE — Telephone Encounter (Signed)
Left HIPPA-compliant message to call back Asher Muir from [facility] at [phone].   Attempt to f/u via phone for pt mood postpartum, as agreed-upon by pt and [facility].

## 2017-04-01 ENCOUNTER — Ambulatory Visit (INDEPENDENT_AMBULATORY_CARE_PROVIDER_SITE_OTHER): Payer: Medicaid Other | Admitting: Obstetrics and Gynecology

## 2017-04-01 ENCOUNTER — Encounter: Payer: Self-pay | Admitting: Obstetrics and Gynecology

## 2017-04-01 VITALS — BP 110/78 | HR 86 | Temp 97.9°F | Wt 166.8 lb

## 2017-04-01 DIAGNOSIS — R309 Painful micturition, unspecified: Secondary | ICD-10-CM

## 2017-04-01 DIAGNOSIS — R102 Pelvic and perineal pain: Secondary | ICD-10-CM

## 2017-04-01 LAB — POCT URINALYSIS DIPSTICK
BILIRUBIN UA: NEGATIVE
Glucose, UA: NEGATIVE
KETONES UA: POSITIVE
Leukocytes, UA: NEGATIVE — AB
Nitrite, UA: NEGATIVE
Protein, UA: NEGATIVE
RBC UA: NEGATIVE
SPEC GRAV UA: 1.01 (ref 1.010–1.025)
Urobilinogen, UA: NEGATIVE E.U./dL — AB
pH, UA: 7 (ref 5.0–8.0)

## 2017-04-01 NOTE — Progress Notes (Signed)
Pt presents for vaginal pressure and pelvic pain since delivery 01/06/17. Pt had 2nd degree laceration.  As noted above. Pt had TSVD in Jan. PP Visit was unremarkable Pt reports Sx as above since delivery. Sx worsen with running and lunges. She is breast feeding and has not been sexual active. She denies any bowel or bladder dysfunction.  PE AF VSS Lungs clear Heart RRR Abd soft + BS GU nl EGBUS, vaginal mucosa slightly atrophic, small area of granulation tissue note, bladder non tender, uterus small mobile non tender, no adnexal masses or tenderness, no evidence of pelvic relaxation noted  A/P Pelvic pain/dysuria Will check UC. Suspect most of pt's Sx are musculoskeletal related. Offered referral to PT. Pt declined d/t not covered by Medicaid. Pt reassured that Sx should improve with time. Increase activities as tolerates. If desires PT eval instructed to call back.

## 2017-04-03 ENCOUNTER — Ambulatory Visit (INDEPENDENT_AMBULATORY_CARE_PROVIDER_SITE_OTHER): Payer: Medicaid Other | Admitting: Family Medicine

## 2017-04-03 ENCOUNTER — Encounter: Payer: Self-pay | Admitting: Family Medicine

## 2017-04-03 DIAGNOSIS — M25532 Pain in left wrist: Secondary | ICD-10-CM | POA: Diagnosis not present

## 2017-04-03 DIAGNOSIS — M25531 Pain in right wrist: Secondary | ICD-10-CM

## 2017-04-03 LAB — URINE CULTURE

## 2017-04-03 MED ORDER — DICLOFENAC SODIUM 1 % TD GEL: 2.0000 g | Freq: Four times a day (QID) | TRANSDERMAL | 1 refills | Status: DC

## 2017-04-03 NOTE — Patient Instructions (Signed)
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 Avoid painful activities as much as possible. Wear the thumb spica braces as often as possible to rest this. Ice 15 minutes at a time 3-4 times a day. Voltaren gel topically up to 4 times a day. Consider a cortisone shot on one or both wrists - concern is this wouldn't help with the extensor tendinitis you have both wrists, flexor tendinitis you also have on the right. Occupational therapy with iontophoresis is another consideration. Follow up with me in 5-6 weeks for reevaluation.

## 2017-04-07 DIAGNOSIS — M25531 Pain in right wrist: Secondary | ICD-10-CM | POA: Insufficient documentation

## 2017-04-07 DIAGNOSIS — M25532 Pain in left wrist: Principal | ICD-10-CM

## 2017-04-07 NOTE — Assessment & Plan Note (Signed)
primarily due to dequervains tenosynovitis but has also developed extensor tendinitis bilaterally and flexor tendinitis on the right.  Switch to thumb spica braces.  Icing, voltaren gel up to 4 times a day.  Consider occupational therapy, injections if not improving.  f/u in 5-6 weeks.

## 2017-04-07 NOTE — Progress Notes (Signed)
PCP: Velora Heckler, MD  Subjective:   HPI: Patient is a 26 y.o. Graves here for bilateral wrist pain.  Patient reports she had a baby in January. Following this she started to get pain first in right wrist then also in the left wrist. Both fairly severe at 9/10 on right, 7/10 on left, sharp. Worse with lifting and gripping. Some swelling. Is in school as well - bothers with typing, writing papers. Wearing standard wrist braces. Mainly about thumbs but also has pain dorsal wrists, volar right wrist. No skin changes, numbness.  Past Medical History:  Diagnosis Date  . Anemia 2012  . Anxiety   . Arthritis   . Asthma 2009   last used inhaler 2009  . Depression   . H/O varicella   . History of gonorrhea   . Hx of chlamydia infection   . Hyperventilation   . Migraine 2010  . PID (pelvic inflammatory disease) 2012   GC and chlamydia     Current Outpatient Prescriptions on File Prior to Visit  Medication Sig Dispense Refill  . albuterol (PROVENTIL HFA;VENTOLIN HFA) 108 (90 Base) MCG/ACT inhaler Inhale 1-2 puffs into the lungs every 6 (six) hours as needed for wheezing or shortness of breath.     . ferrous sulfate 325 (65 FE) MG tablet Take 1 tablet (325 mg total) by mouth 2 (two) times daily with a meal. 60 tablet 3  . pantoprazole (PROTONIX) 40 MG tablet Take 1 tablet (40 mg total) by mouth daily. 30 tablet 6  . Prenat-FeCbn-FeAspGl-FA-Omega (OB COMPLETE PETITE) 35-5-1-200 MG CAPS Take 1 capsule by mouth daily.     No current facility-administered medications on file prior to visit.     No past surgical history on file.  Allergies  Allergen Reactions  . Latex Hives    Social History   Social History  . Marital status: Single    Spouse name: N/A  . Number of children: N/A  . Years of education: 34   Occupational History  . 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 Smoking status: Former Smoker    Start date:  09/15/2012  . Smokeless tobacco: Never Used  . Alcohol use Yes     Comment: occ  . Drug use: No  . Sexual activity: Not on file   Other Topics Concern  . Not on file   Social History Narrative   Lives alone. Own apartment.     Family History  Problem Relation Age of Onset  . Hyperlipidemia Mother   . Hypertension Mother   . Kidney disease Mother   . Hypertension Father   . Hypertension Brother   . Hyperlipidemia Brother   . Diabetes Maternal Grandmother   . Diabetes Maternal Grandfather   . Cancer Maternal Aunt 58    Ovarian CA    BP 117/64   Pulse 92   Ht  (1.651 m)   Wt 170 lb (77.1 kg)   BMI Shelby.29 kg/m   Review of Systems: See HPI above.     Objective:  Physical Exam:  Gen: NAD, comfortable in exam room  Right wrist: No gross deformity, swelling, bruising. TTP 1st dorsal compartment, over flexor and extensor tendons as well.  No other tenderness of hand or wrist. FROM with pain on all motions of thumb, full flexion and extension of wrist. Negative tinels, phalens. Mild positive finkelsteins. NVI distally.  Left wrist: No gross  deformity, swelling, bruising. TTP 1st dorsal compartment, over extensor tendons as well.  No other tenderness of hand or wrist. FROM with pain on all motions of thumb, full flexion and extension of wrist. Negative tinels, phalens. Mild positive finkelsteins. NVI distally.   Assessment & Plan:  1. Bilateral wrist pain - primarily due to dequervains tenosynovitis but has also developed extensor tendinitis bilaterally and flexor tendinitis on the right.  Switch to thumb spica braces.  Icing, voltaren gel up to 4 times a day.  Consider occupational therapy, injections if not improving.  f/u in 5-6 weeks.

## 2017-05-01 IMAGING — US US OB COMP LESS 14 WK
1 series · 15 of 28 positions shown · non-contrast
Comparison: Pelvic ultrasound June 14, 2015

CLINICAL DATA: Chronic left pelvic pain

EXAM:
OBSTETRIC <14 WK US AND TRANSVAGINAL OB US
TECHNIQUE: Both transabdominal and transvaginal ultrasound examinations were
performed for complete evaluation of the gestation as well as the
maternal uterus, adnexal regions, and pelvic cul-de-sac.
Transvaginal technique was performed to assess early pregnancy.

[Series 1: us ob comp less 14 wk · 15 of 59 slices shown]
[im 1/59]
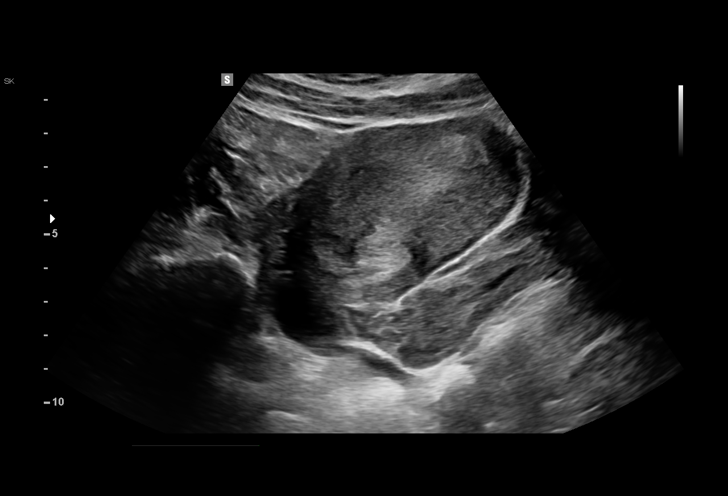
[im 5/59]
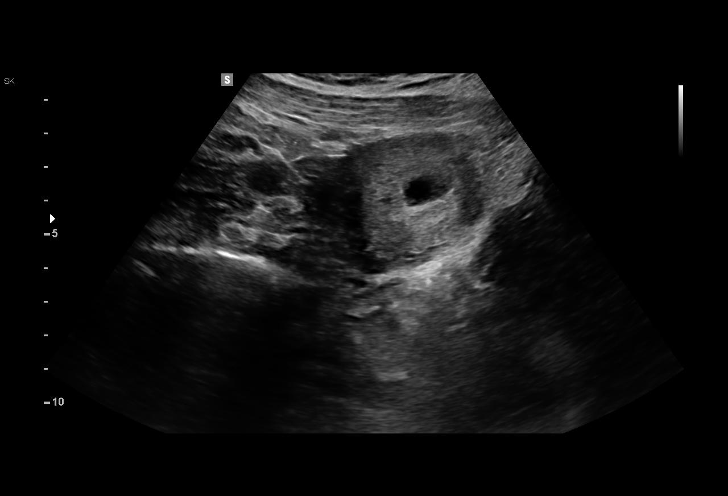
[im 9/59]
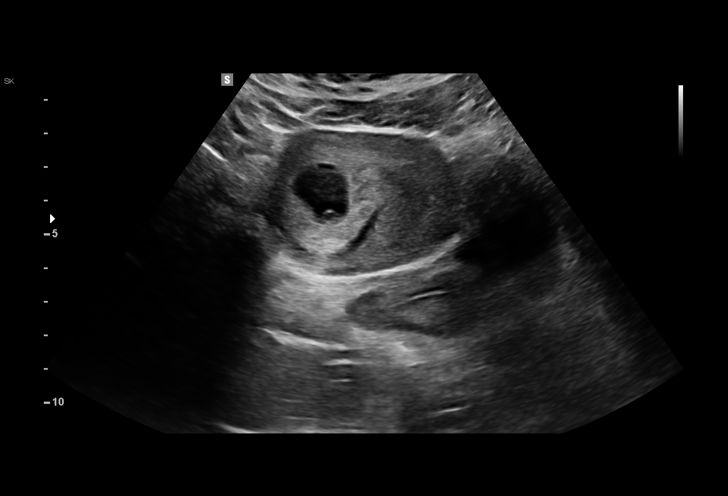
[im 13/59]
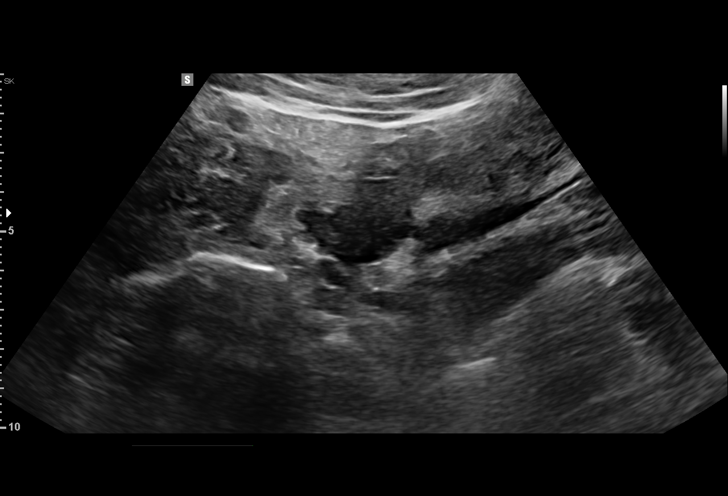
[im 18/59]
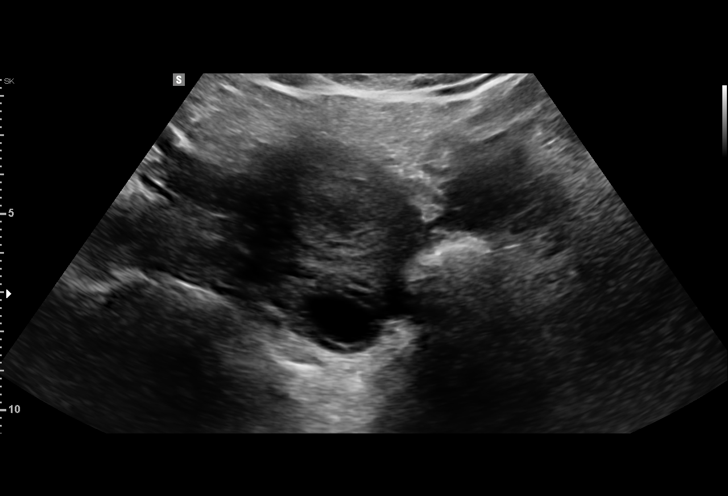
[im 22/59]
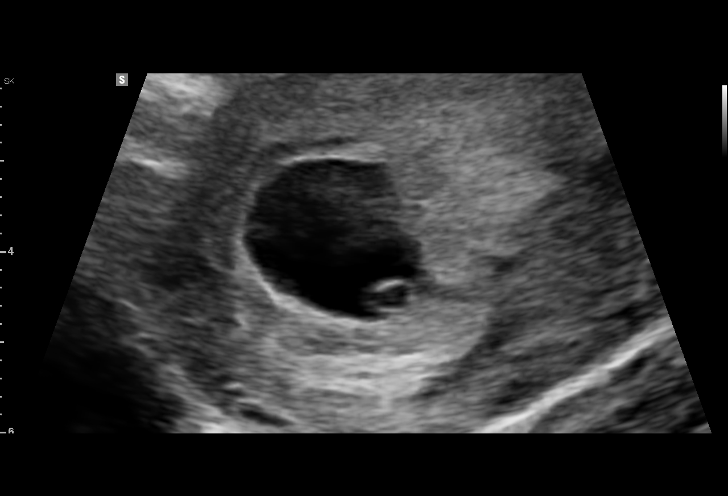
[im 26/59]
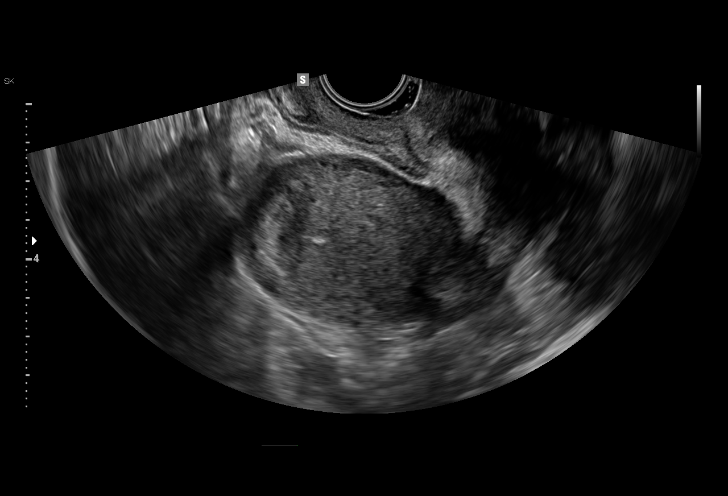
[im 31/59]
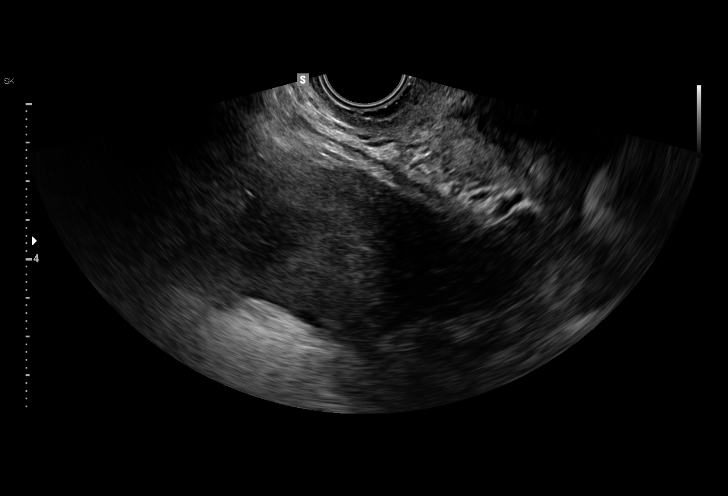
[im 33/59]
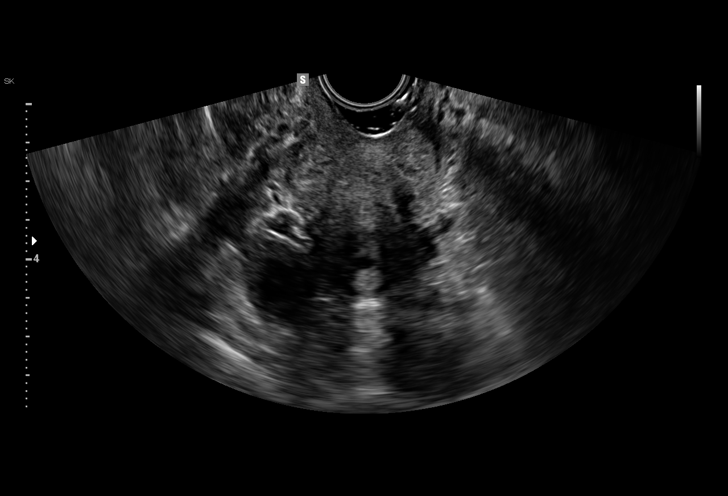
[im 37/59]
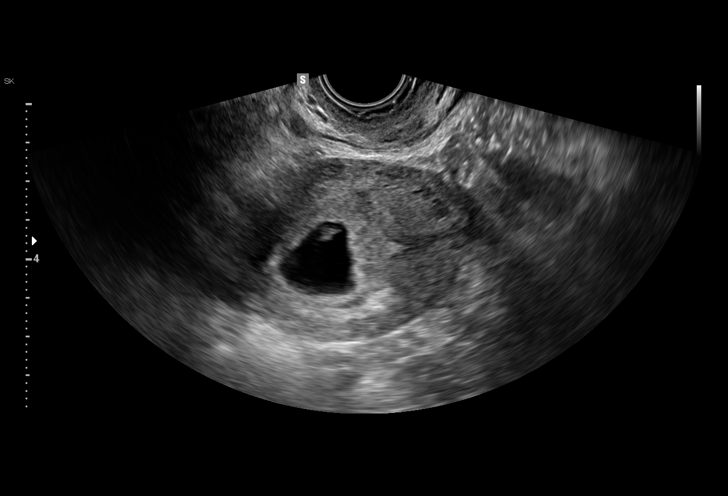
[im 41/59]
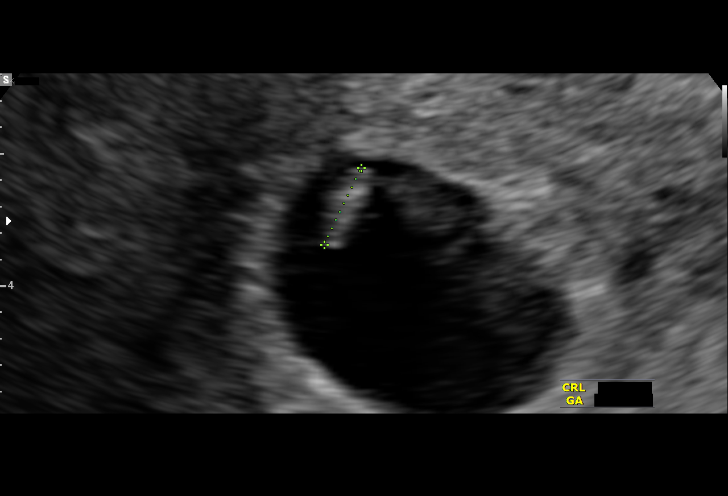
[im 46/59]
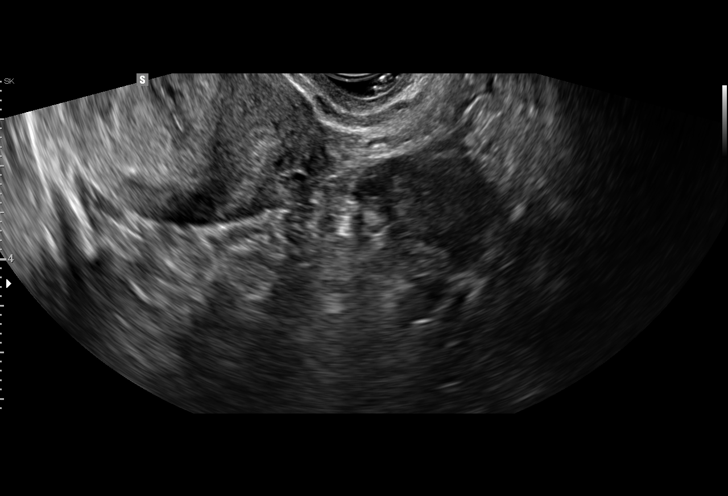
[im 50/59]
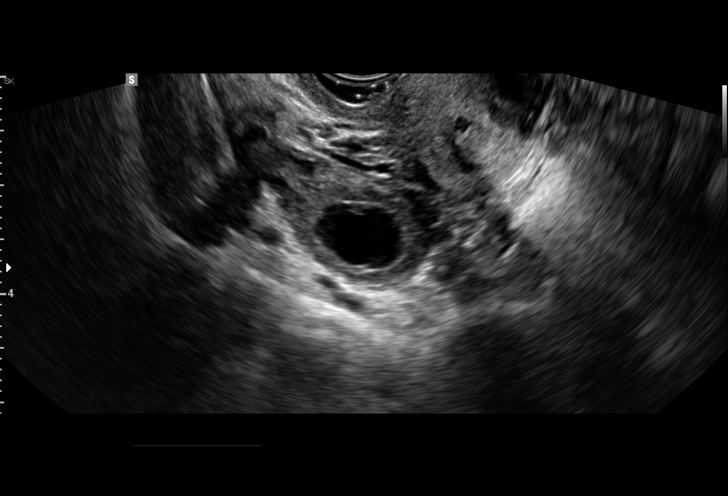
[im 54/59]
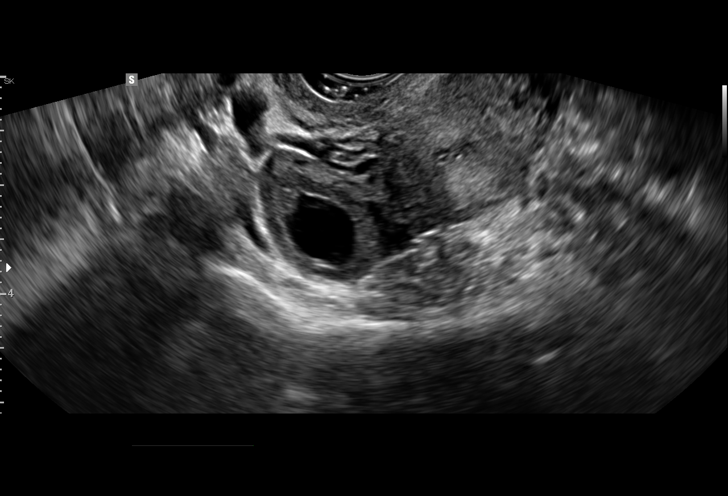
[im 59/59]
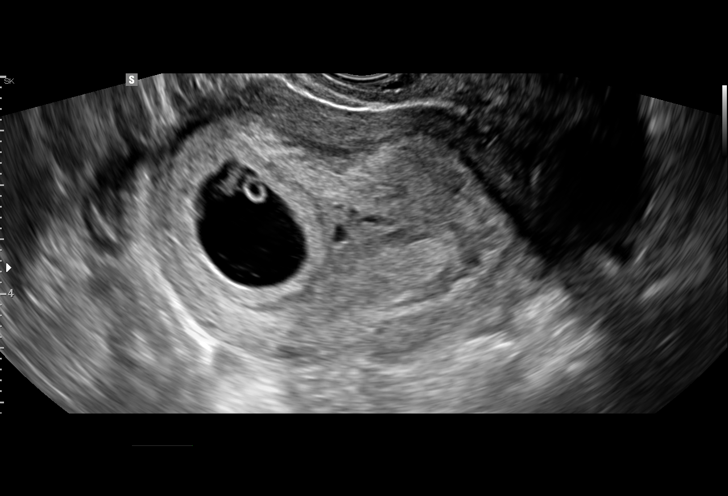

[15 of 28 positions shown; findings below may reference images not displayed]

FINDINGS: Intrauterine gestational sac: Visualized

Yolk sac:  Visualized

Embryo:  Visualized

Cardiac Activity: Visualized

Heart Rate: 140  bpm

CRL:  6  mm   6 w   3 d                  US EDC: January 11, 2017

Subchorionic hemorrhage:  None visualized.

Maternal uterus/adnexae: Cervical os is closed. There is a
physiologic corpus luteum in the right ovary measuring 2.0 x 1.3 x
1.2 cm. There is no other extrauterine pelvic or adnexal mass. No
free pelvic fluid.
IMPRESSION: Single live intrauterine gestation with estimated gestational age of
6+ weeks. Study otherwise unremarkable.

## 2017-05-14 ENCOUNTER — Encounter: Payer: Self-pay | Admitting: Family Medicine

## 2017-05-14 ENCOUNTER — Ambulatory Visit (INDEPENDENT_AMBULATORY_CARE_PROVIDER_SITE_OTHER): Payer: Medicaid Other | Admitting: Family Medicine

## 2017-05-14 VITALS — BP 113/68 | HR 85 | Ht 65.0 in | Wt 168.0 lb

## 2017-05-14 DIAGNOSIS — M25532 Pain in left wrist: Secondary | ICD-10-CM

## 2017-05-14 DIAGNOSIS — M25531 Pain in right wrist: Secondary | ICD-10-CM | POA: Diagnosis present

## 2017-05-14 MED ORDER — METHYLPREDNISOLONE ACETATE 40 MG/ML IJ SUSP
20.0000 mg | Freq: Once | INTRAMUSCULAR | Status: AC
  Administered 2017-05-14: 20 mg via INTRA_ARTICULAR

## 2017-05-14 NOTE — Patient Instructions (Signed)
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 Avoid painful activities as much as possible. I'd still wear the braces until the shots have kicked in over the next week. Ice 15 minutes at a time 3-4 times a day. Voltaren gel topically up to 4 times a day. Consider occupational therapy, MRI of the more symptomatic right wrist if not improving. Follow up with me in 1 month if you're doing well for reevaluation.

## 2017-05-18 NOTE — Assessment & Plan Note (Signed)
primarily due to dequervains tenosynovitis but has also developed extensor tendinitis bilaterally and flexor tendinitis on the right.  She has not noticed improvement with bracing, voltaren gel.  She would like to try bilateral dequervains injections which were given today.  Continue braces, icing, voltaren gel.  Consider occupational therapy of both wrists, MRI of more symptomatic right wrist if still not improving following injections.  F/u in 1 month.  After informed written consent patient was seated in chair in exam room.  Area overlying 1st dorsal compartment of right wrist was prepped with alcohol swab then right 1st dorsal compartment was injected with 0.5:0.725mL bupivicaine: depomedrol.  Patient tolerated procedure well without immediate complications.  After informed written consent patient was seated in chair in exam room.  Area overlying 1st dorsal compartment of left wrist was prepped with alcohol swab then left 1st dorsal compartment was injected with 0.5:0.275mL bupivicaine: depomedrol.  Patient tolerated procedure well without immediate complications.

## 2017-05-18 NOTE — Progress Notes (Signed)
PCP: Bonney Aid, MD  Subjective:   HPI: Patient is a 26 y.o. female here for bilateral wrist pain.  4/12: Patient reports she had a baby in January. Following this she started to get pain first in right wrist then also in the left wrist. Both fairly severe at 9/10 on right, 7/10 on left, sharp. Worse with lifting and gripping. Some swelling. Is in school as well - bothers with typing, writing papers. Wearing standard wrist braces. Mainly about thumbs but also has pain dorsal wrists, volar right wrist. No skin changes, numbness.  5/23: Patient returns with persistent pain in right > left wrists. Pain is primarily radial but on right side has dorsal and volar pain also. No skin changes, numbness. Pain is 9/10 and sharp . Tender to the touch. Wearing thumb spica braces, icing. Using voltaren gel.  Past Medical History:  Diagnosis Date  . Anemia 2012  . Anxiety   . Arthritis   . Asthma 2009   last used inhaler 2009  . Depression   . H/O varicella   . History of gonorrhea   . Hx of chlamydia infection   . Hyperventilation   . Migraine 2010  . PID (pelvic inflammatory disease) 2012   GC and chlamydia     Current Outpatient Prescriptions on File Prior to Visit  Medication Sig Dispense Refill  . albuterol (PROVENTIL HFA;VENTOLIN HFA) 108 (90 Base) MCG/ACT inhaler Inhale 1-2 puffs into the lungs every 6 (six) hours as needed for wheezing or shortness of breath.     . diclofenac sodium (VOLTAREN) 1 % GEL Apply 2 g topically 4 (four) times daily. 3 Tube 1  . ferrous sulfate 325 (65 FE) MG tablet Take 1 tablet (325 mg total) by mouth 2 (two) times daily with a meal. 60 tablet 3  . pantoprazole (PROTONIX) 40 MG tablet Take 1 tablet (40 mg total) by mouth daily. 30 tablet 6  . Prenat-FeCbn-FeAspGl-FA-Omega (OB COMPLETE PETITE) 35-5-1-200 MG CAPS Take 1 capsule by mouth daily.     No current facility-administered medications on file prior to visit.     No past surgical  history on file.  Allergies  Allergen Reactions  . Latex Hives    Social History   Social History  . Marital status: Single    Spouse name: N/A  . Number of children: N/A  . Years of education: 16   Occupational History  . 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 Smoking status: Former Smoker    Start date: 09/15/2012  . Smokeless tobacco: Never Used  . Alcohol use Yes     Comment: occ  . Drug use: No  . Sexual activity: Not on file   Other Topics Concern  . Not on file   Social History Narrative   Lives alone. Own apartment.     Family History  Problem Relation Age of Onset  . Hyperlipidemia Mother   . Hypertension Mother   . Kidney disease Mother   . Hypertension Father   . Hypertension Brother   . Hyperlipidemia Brother   . Diabetes Maternal Grandmother   . Diabetes Maternal Grandfather   . Cancer Maternal Aunt 58       Ovarian CA    BP 113/68   Pulse 85   Ht 5\' 5"  (1.651 m)   Wt 168 lb (76.2 kg)   BMI 27.96 kg/m   Review of  Systems: See HPI above.     Objective:  Physical Exam:  Gen: NAD, comfortable in exam room  Right wrist: No gross deformity, swelling, bruising. TTP 1st dorsal compartment, over flexor and extensor tendons as well.  No other tenderness of hand or wrist. FROM with pain on all motions of thumb, full flexion and extension of wrist. Negative tinels, phalens. Mild positive finkelsteins. NVI distally.  Left wrist: No gross deformity, swelling, bruising. TTP 1st dorsal compartment, less over extensor tendons as well.  No other tenderness of hand or wrist. FROM with pain on all motions of thumb. Negative tinels, phalens. Mild positive finkelsteins. NVI distally.   Assessment & Plan:  1. Bilateral wrist pain - primarily due to dequervains tenosynovitis but has also developed extensor tendinitis bilaterally and flexor tendinitis on the right.  She has not noticed improvement  with bracing, voltaren gel.  She would like to try bilateral dequervains injections which were given today.  Continue braces, icing, voltaren gel.  Consider occupational therapy of both wrists, MRI of more symptomatic right wrist if still not improving following injections.  F/u in 1 month.  After informed written consent patient was seated in chair in exam room.  Area overlying 1st dorsal compartment of right wrist was prepped with alcohol swab then right 1st dorsal compartment was injected with 0.5:0.615mL bupivicaine: depomedrol.  Patient tolerated procedure well without immediate complications.  After informed written consent patient was seated in chair in exam room.  Area overlying 1st dorsal compartment of left wrist was prepped with alcohol swab then left 1st dorsal compartment was injected with 0.5:0.435mL bupivicaine: depomedrol.  Patient tolerated procedure well without immediate complications.

## 2017-06-03 ENCOUNTER — Encounter: Payer: Self-pay | Admitting: Student

## 2017-06-03 ENCOUNTER — Other Ambulatory Visit (HOSPITAL_COMMUNITY)
Admission: RE | Admit: 2017-06-03 | Discharge: 2017-06-03 | Disposition: A | Discharge status: Home / Self Care | Payer: Medicaid Other | Source: Ambulatory Visit | Attending: Family Medicine | Admitting: Family Medicine

## 2017-06-03 ENCOUNTER — Telehealth: Payer: Self-pay | Admitting: Student

## 2017-06-03 ENCOUNTER — Ambulatory Visit (INDEPENDENT_AMBULATORY_CARE_PROVIDER_SITE_OTHER): Payer: Medicaid Other | Admitting: Student

## 2017-06-03 VITALS — BP 100/60 | HR 77 | Temp 98.5°F | Ht 65.0 in | Wt 168.0 lb

## 2017-06-03 DIAGNOSIS — Z0000 Encounter for general adult medical examination without abnormal findings: Secondary | ICD-10-CM | POA: Insufficient documentation

## 2017-06-03 DIAGNOSIS — Z124 Encounter for screening for malignant neoplasm of cervix: Secondary | ICD-10-CM

## 2017-06-03 DIAGNOSIS — M25531 Pain in right wrist: Secondary | ICD-10-CM | POA: Diagnosis not present

## 2017-06-03 DIAGNOSIS — N898 Other specified noninflammatory disorders of vagina: Secondary | ICD-10-CM | POA: Diagnosis not present

## 2017-06-03 DIAGNOSIS — M79672 Pain in left foot: Secondary | ICD-10-CM

## 2017-06-03 LAB — POCT WET PREP (WET MOUNT)
Clue Cells Wet Prep Whiff POC: NEGATIVE
Trichomonas Wet Prep HPF POC: ABSENT

## 2017-06-03 MED ORDER — PANTOPRAZOLE SODIUM 40 MG PO TBEC: 40.0000 mg | DELAYED_RELEASE_TABLET | Freq: Every day | ORAL | 6 refills | Status: DC

## 2017-06-03 MED ORDER — FLUCONAZOLE 150 MG PO TABS: 150.0000 mg | ORAL_TABLET | Freq: Once | ORAL | 0 refills | Status: AC

## 2017-06-03 NOTE — Telephone Encounter (Signed)
Pt contacted and informed of yeast on wet prep and diflucan to her pharmacy.

## 2017-06-03 NOTE — Telephone Encounter (Signed)
Wet prep showed yeast, will call in diflucan for her

## 2017-06-03 NOTE — Addendum Note (Signed)
Addended by: Velora HecklerHANEY, ALYSSA A on: 06/03/2017 04:01 PM   Modules accepted: Orders

## 2017-06-03 NOTE — Progress Notes (Addendum)
   Subjective:    Patient ID: Shelby Graves, female    DOB: 07/04/1991, 26 y.o.   MRN: [id number]   CC: Physical exam  HPI: 26 y/o F presents for physical exam  Health care maintanence - due for pap - some vaginal discharge, no irritation or pain   Dequervains Tenosynovitis/carpal tunnel - seen by sports medicine for this and had bilateral steroid injection which has helped  Chronic foot numbness - seen by sports medicine for this with plan to follow in 1 month - has occasional numbness with some pain in her left forefoot - has a remote history of left knee injury - no worsening symptoms or weakness  Smoking status reviewed  Review of Systems  Per HPI, else denies recent illness, fever, chest pain, shortness of breath,     Objective:  BP 100/60   Pulse 77   Temp 98.5 F (36.9 C) (Oral)   Ht 5\' 5"  (1.651 m)   Wt 168 lb (76.2 kg)   LMP  (LMP Unknown)   SpO2 97%   Breastfeeding? Yes   BMI 27.96 kg/m  Vitals and nursing note reviewed  General: NAD Cardiac: RRR,  Respiratory: CTAB, normal effort Extremities: no edema or cyanosis. WWP. + tinnells sign on the right, no pain to palpation or swelling, no left knee swelling or pain to palaption, normal ROM, normal ankle ROM with some tenderness to forefoot to palpation Skin: warm and dry, no rashes noted Neuro: alert and oriented, no focal deficits   Assessment & Plan:    Healthcare maintenance Pap today, will follow  Vaginal discharge Wet  Prep and GC/Ct today, will follow and treat as needed - wet prep showed yeast, will treat with diflucan  Left foot pain Followed by sports medicine  Right wrist pain Followed by sports medicine    Leeandre Nordling A. Kennon RoundsHaney MD, MS Family Medicine Resident PGY-3 Pager [phone]

## 2017-06-03 NOTE — Patient Instructions (Signed)
Follow up as needed You will be called for any abnormal results Call the office at [phone] with questions or concerns

## 2017-06-03 NOTE — Assessment & Plan Note (Signed)
Pap today, will follow 

## 2017-06-03 NOTE — Assessment & Plan Note (Addendum)
Wet  Prep and GC/Ct today, will follow and treat as needed - wet prep showed yeast, will treat with diflucan

## 2017-06-03 NOTE — Assessment & Plan Note (Signed)
Followed by sports medicine

## 2017-06-03 NOTE — Assessment & Plan Note (Signed)
Followed by sports medicine 

## 2017-06-04 ENCOUNTER — Encounter: Payer: Self-pay | Admitting: Family Medicine

## 2017-06-04 ENCOUNTER — Ambulatory Visit (INDEPENDENT_AMBULATORY_CARE_PROVIDER_SITE_OTHER): Payer: Medicaid Other | Admitting: Family Medicine

## 2017-06-04 DIAGNOSIS — R200 Anesthesia of skin: Secondary | ICD-10-CM | POA: Diagnosis not present

## 2017-06-04 DIAGNOSIS — M79605 Pain in left leg: Secondary | ICD-10-CM

## 2017-06-04 DIAGNOSIS — M25532 Pain in left wrist: Secondary | ICD-10-CM

## 2017-06-04 DIAGNOSIS — M25531 Pain in right wrist: Secondary | ICD-10-CM | POA: Diagnosis not present

## 2017-06-04 NOTE — Patient Instructions (Signed)
We will go ahead with nerve conduction studies/EMGs of your left lower extremity to evaluate the numbness.  I'm hoping the work restrictions will help alleviate more of your wrist/hand symptoms. Use wrist braces when possible still. Ice 15 minutes at a time 3-4 times a day. Voltaren gel topically up to 4 times a day. Follow up will depend on the nerve testing.

## 2017-06-05 DIAGNOSIS — R200 Anesthesia of skin: Secondary | ICD-10-CM | POA: Insufficient documentation

## 2017-06-05 NOTE — Progress Notes (Signed)
PCP: Bonney Aid, MD  Subjective:   HPI: Patient is a 26 y.o. female here for bilateral wrist pain.  4/12: Patient reports she had a baby in January. Following this she started to get pain first in right wrist then also in the left wrist. Both fairly severe at 9/10 on right, 7/10 on left, sharp. Worse with lifting and gripping. Some swelling. Is in school as well - bothers with typing, writing papers. Wearing standard wrist braces. Mainly about thumbs but also has pain dorsal wrists, volar right wrist. No skin changes, numbness.  5/23: Patient returns with persistent pain in right > left wrists. Pain is primarily radial but on right side has dorsal and volar pain also. No skin changes, numbness. Pain is 9/10 and sharp . Tender to the touch. Wearing thumb spica braces, icing. Using voltaren gel.  6/13: Patient reports her wrists have improved since last visit. Still with some pain though at 5/10 level at worst. Bothers with grabbing or lifting things. No swelling; some tingling. No skin changes. Also with at least 3 months of left lower leg numbness. States this goes into all toes, dorsal foot. She recalls dislocating knee in 2012 (unsure if patella or knee - was put back in place.  Was in [place] at the time - put in knee brace and that was all). Pain can get up to 8/10.  Past Medical History:  Diagnosis Date  . Anemia 2012  . Anxiety   . Arthritis   . Asthma 2009   last used inhaler 2009  . Depression   . H/O varicella   . History of gonorrhea   . Hx of chlamydia infection   . Hyperventilation   . Migraine 2010  . PID (pelvic inflammatory disease) 2012   GC and chlamydia     Current Outpatient Prescriptions on File Prior to Visit  Medication Sig Dispense Refill  . albuterol (PROVENTIL HFA;VENTOLIN HFA) 108 (90 Base) MCG/ACT inhaler Inhale 1-2 puffs into the lungs every 6 (six) hours as needed for wheezing or shortness of breath.     . diclofenac sodium  (VOLTAREN) 1 % GEL Apply 2 g topically 4 (four) times daily. 3 Tube 1  . ferrous sulfate 325 (65 FE) MG tablet Take 1 tablet (325 mg total) by mouth 2 (two) times daily with a meal. 60 tablet 3  . pantoprazole (PROTONIX) 40 MG tablet Take 1 tablet (40 mg total) by mouth daily. 30 tablet 6  . Prenat-FeCbn-FeAspGl-FA-Omega (OB COMPLETE PETITE) 35-5-1-200 MG CAPS Take 1 capsule by mouth daily.     No current facility-administered medications on file prior to visit.     No past surgical history on file.  Allergies  Allergen Reactions  . Latex Hives    Social History   Social History  . Marital status: Single    Spouse name: N/A  . Number of children: N/A  . Years of education: 25   Occupational History  . 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 Smoking status: Former Smoker    Start date: 09/15/2012  . Smokeless tobacco: Never Used  . Alcohol use Yes     Comment: occ  . Drug use: No  . Sexual activity: Not on file   Other Topics Concern  . Not on file   Social History Narrative   Lives alone. Own apartment.     Family History  Problem Relation  Age of Onset  . Hyperlipidemia Mother   . Hypertension Mother   . Kidney disease Mother   . Hypertension Father   . Hypertension Brother   . Hyperlipidemia Brother   . Diabetes Maternal Grandmother   . Diabetes Maternal Grandfather   . Cancer Maternal Aunt 58       Ovarian CA    BP 110/71   Pulse 92   Ht 5\' 5"  (1.651 m)   Wt 170 lb (77.1 kg)   LMP  (LMP Unknown)   BMI 28.29 kg/m   Review of Systems: See HPI above.     Objective:  Physical Exam:  Gen: NAD, comfortable in exam room  Left leg: No gross deformity, swelling, bruising. No tenderness to palpation. FROM ankle, knee with 5/5 strength all directions. Decreased sensation in 3rd, 4th digits. Negative tinels at tarsal tunnel, fibular head.  Left knee: No gross deformity, ecchymoses, swelling. No  TTP. FROM. Negative ant/post drawers. Negative valgus/varus testing. Negative lachmanns. Negative mcmurrays, apleys, patellar apprehension. NV intact distally.  Right wrist: No gross deformity, swelling, bruising. Minimal TTP 1st dorsal compartment, over flexor and extensor tendons as well.  No other tenderness of hand or wrist. FROM without pain of wrist, thumb, digits. Negative tinels, phalens. Negative finkelsteins. NVI distally.  Left wrist: No gross deformity, swelling, bruising. Minimal TTP 1st dorsal compartment, extensor tendons.  No other tenderness of hand or wrist. FROM with minimal pain on thumb motions. Negative tinels, phalens. Mild positive finkelsteins. NVI distally.   Assessment & Plan:  1. Bilateral wrist pain - primarily due to dequervains tenosynovitis but also with extensor tendinitis bilaterally, flexor tendinitis on the right.  Improvement with 1st dorsal compartment injections, bracing, voltaren gel.  Wrist braces when possible, icing, voltaren gel.    2. Left leg numbness - Unusual distribution.  No evidence radiculopathy, compartment syndrome, compression at fibular head.  Inconsistent with tarsal tunnel syndrome.  Advised going ahead with NCVs/EMGs to further evaluate.

## 2017-06-05 NOTE — Assessment & Plan Note (Signed)
primarily due to dequervains tenosynovitis but also with extensor tendinitis bilaterally, flexor tendinitis on the right.  Improvement with 1st dorsal compartment injections, bracing, voltaren gel.  Wrist braces when possible, icing, voltaren gel.

## 2017-06-05 NOTE — Assessment & Plan Note (Signed)
Unusual distribution.  No evidence radiculopathy, compartment syndrome, compression at fibular head.  Inconsistent with tarsal tunnel syndrome.  Advised going ahead with NCVs/EMGs to further evaluate.

## 2017-06-06 LAB — CYTOLOGY - PAP
Chlamydia: NEGATIVE
Diagnosis: NEGATIVE
NEISSERIA GONORRHEA: NEGATIVE

## 2017-06-09 ENCOUNTER — Telehealth: Payer: Self-pay | Admitting: Student

## 2017-06-09 NOTE — Telephone Encounter (Signed)
Pt contacted and informed of normal pap results. Pt voiced understanding.

## 2017-06-09 NOTE — Telephone Encounter (Signed)
Please inform the patient that her pap smear was normal

## 2017-07-31 ENCOUNTER — Encounter (INDEPENDENT_AMBULATORY_CARE_PROVIDER_SITE_OTHER): Payer: Self-pay

## 2017-07-31 ENCOUNTER — Ambulatory Visit (INDEPENDENT_AMBULATORY_CARE_PROVIDER_SITE_OTHER): Payer: Medicaid Other | Admitting: Diagnostic Neuroimaging

## 2017-07-31 DIAGNOSIS — R200 Anesthesia of skin: Secondary | ICD-10-CM | POA: Diagnosis not present

## 2017-07-31 DIAGNOSIS — M79642 Pain in left hand: Secondary | ICD-10-CM

## 2017-07-31 DIAGNOSIS — Z0289 Encounter for other administrative examinations: Secondary | ICD-10-CM

## 2017-07-31 DIAGNOSIS — M79641 Pain in right hand: Secondary | ICD-10-CM

## 2017-08-01 NOTE — Procedures (Signed)
[facility]  NCS (NERVE CONDUCTION STUDY) WITH EMG (ELECTROMYOGRAPHY) REPORT   STUDY DATE: 07/31/17 PATIENT NAME: Shelby Graves DOB: 05/30/1991 MRN: [id number]  ORDERING CLINICIAN: Lazaro ArmsS Hudnall, MD  TECHNOLOGIST: Charlesetta IvoryBeau Handy ELECTROMYOGRAPHER: Glenford BayleyVikram R. Penumalli, MD  CLINICAL INFORMATION: 26 year old female with bilateral wrist and hand pain and left leg numbness and pain.   FINDINGS: NERVE CONDUCTION STUDY: Bilateral median and ulnar motor responses are normal.   Left peroneal and left tibial motor responses are normal.  Bilateral ulnar and left tibial F wave latencies are normal.  Bilateral radial, bilateral median, bilateral ulnar, left sural and left superficial peroneal sensory responses are normal.   NEEDLE ELECTROMYOGRAPHY: Needle examination of left lower extremity vastus medialis, tibialis anterior, gastrocnemius and left lumbar paraspinal muscles are normal.   IMPRESSION:  This is a normal study. No electrodiagnostic evidence of large fiber neuropathy or left lumbar radiculopathy at this time.    INTERPRETING PHYSICIAN:  Suanne MarkerVIKRAM R. PENUMALLI, MD Certified in Neurology, Neurophysiology and Neuroimaging  [facility] [address], [address] [place], [place] [address] [phone]   [facility]    Nerve / Sites Muscle Latency Ref. Amplitude Ref. Rel Amp Segments Distance Velocity Ref. Area    ms ms mV mV %  cm m/s m/s mVms  R Median - APB     Wrist APB 3.2 ?4.4 8.4 ?4.0 100 Wrist - APB 7   28.9     Upper arm APB 6.7  8.2  98 Upper arm - Wrist 20 57 ?49 28.8  L Median - APB     Wrist APB 3.2 ?4.4 9.2 ?4.0 100 Wrist - APB 7   31.5     Upper arm APB 6.6  8.7  93.9 Upper arm - Wrist 20 59 ?49 30.9  R Ulnar - ADM     Wrist ADM 2.4 ?3.3 12.5 ?6.0 100 Wrist - ADM 7   32.1     B.Elbow ADM 5.5  11.8  94.4 B.Elbow - Wrist 18 60 ?49 30.3     A.Elbow ADM 7.2  11.9  101 A.Elbow - B.Elbow 10 58 ?49 32.0         A.Elbow - Wrist      L  Ulnar - ADM     Wrist ADM 2.4 ?3.3 10.5 ?6.0 100 Wrist - ADM 7   27.9     B.Elbow ADM 5.5  9.8  93 B.Elbow - Wrist 18 59 ?49 27.8     A.Elbow ADM 7.3  9.1  93 A.Elbow - B.Elbow 10 56 ?49 28.4         A.Elbow - Wrist      L Peroneal - EDB     Ankle EDB 4.4 ?6.5 6.1 ?2.0 100 Ankle - EDB 9   18.2     Fib head EDB 10.4  5.1  83 Fib head - Ankle 31 51 ?44 16.6     Pop fossa EDB 12.7  4.8  94.1 Pop fossa - Fib head 12 52 ?44 17.5         Pop fossa - Ankle      L Tibial - AH     Ankle AH 4.5 ?5.8 12.2 ?4.0 100 Ankle - AH 9   34.4     Pop fossa AH 13.4  8.8  71.7 Pop fossa - Ankle 38 43 ?41 30.5                 SNC  Nerve / Sites Rec. Site Peak Lat Ref.  Amp Ref. Segments Distance    ms ms V V  cm  R Radial - Anatomical snuff box (Forearm)     Forearm Wrist 2.2 ?2.9 28 ?15 Forearm - Wrist 10  L Radial - Anatomical snuff box (Forearm)     Forearm Wrist 2.5 ?2.9 26 ?15 Forearm - Wrist 10  L Sural - Ankle (Calf)     Calf Ankle 3.4 ?4.4 30 ?6 Calf - Ankle 14  L Superficial peroneal - Ankle     Lat leg Ankle 3.6 ?4.4 10 ?6 Lat leg - Ankle 14  R Median - Orthodromic (Dig II, Mid palm)     Dig II Wrist 2.5 ?3.4 21 ?10 Dig II - Wrist 13  L Median - Orthodromic (Dig II, Mid palm)     Dig II Wrist 2.5 ?3.4 50 ?10 Dig II - Wrist 13  R Ulnar - Orthodromic, (Dig V, Mid palm)     Dig V Wrist 2.6 ?3.1 21 ?5 Dig V - Wrist 11  L Ulnar - Orthodromic, (Dig V, Mid palm)     Dig V Wrist 2.4 ?3.1 23 ?5 Dig V - Wrist 52                     F  Wave    Nerve F Lat Ref.   ms ms  R Ulnar - ADM 25.3 ?32.0  L Ulnar - ADM 24.8 ?32.0  L Tibial - AH 51.5 ?56.0               EMG full       EMG Summary Table    Spontaneous MUAP Recruitment  Muscle IA Fib PSW Fasc Other Amp Dur. Poly Pattern  L. Vastus medialis Normal None None None _______ Normal Normal Normal Normal  L. Tibialis anterior Normal None None None _______ Normal Normal Normal Normal  L. Gastrocnemius (Medial head) Normal None None None  _______ Normal Normal Normal Normal  L. Lumbar paraspinals Normal None None None _______ Normal Normal Normal Normal

## 2017-10-03 ENCOUNTER — Encounter: Payer: Self-pay | Admitting: Family Medicine

## 2017-10-03 ENCOUNTER — Ambulatory Visit (INDEPENDENT_AMBULATORY_CARE_PROVIDER_SITE_OTHER): Payer: Medicaid Other | Admitting: Family Medicine

## 2017-10-03 DIAGNOSIS — M25532 Pain in left wrist: Secondary | ICD-10-CM

## 2017-10-03 DIAGNOSIS — M25531 Pain in right wrist: Secondary | ICD-10-CM | POA: Diagnosis present

## 2017-10-03 NOTE — Patient Instructions (Signed)
Your nerve test was normal despite your symptoms suggesting carpal tunnel. This may be a small fiber neuropathy. It's possible the nerve test was falsely normal. Another consideration is something neurologic causing your symptoms but not from compression of peripheral nerves like carpal tunnel (though this would be unusual). I'd go ahead with a neurology referral for further evaluation. If this comes back normal, would consider carpal tunnel injection on one side for diagnostic and therapeutic purposes. In meantime I'd continue wearing braces as often as possible. Voltaren gel up to 4 times a day. Let me know after you see the neurologist.

## 2017-10-05 NOTE — Progress Notes (Signed)
PCP: Patient, No Pcp Per  Subjective:   HPI: Patient is a 26 y.o. female here for bilateral wrist pain.  4/12: Patient reports she had a baby in January. Following this she started to get pain first in right wrist then also in the left wrist. Both fairly severe at 9/10 on right, 7/10 on left, sharp. Worse with lifting and gripping. Some swelling. Is in school as well - bothers with typing, writing papers. Wearing standard wrist braces. Mainly about thumbs but also has pain dorsal wrists, volar right wrist. No skin changes, numbness.  5/23: Patient returns with persistent pain in right > left wrists. Pain is primarily radial but on right side has dorsal and volar pain also. No skin changes, numbness. Pain is 9/10 and sharp . Tender to the touch. Wearing thumb spica braces, icing. Using voltaren gel.  6/13: Patient reports her wrists have improved since last visit. Still with some pain though at 5/10 level at worst. Bothers with grabbing or lifting things. No swelling; some tingling. No skin changes. Also with at least 3 months of left lower leg numbness. States this goes into all toes, dorsal foot. She recalls dislocating knee in 2012 (unsure if patella or knee - was put back in place.  Was in [place] at the time - put in knee brace and that was all). Pain can get up to 8/10.  10/12: Patient recently returned from [place] for a couple months. She states symptoms in both hands and wrists have not improved. Pain level 6/10 in both. Worse with carrying her child and taking care of the baby since she is moving around more. Pain radiates into fingers of both hands with associated numbness. Cramping with a lot of writing and testing. Can only drive 15 minutes before pain starts. Pain is a stabbing type of pain with tightness volar wrists. No skin changes.   Past Medical History:  Diagnosis Date  . Anemia 2012  . Anxiety   . Arthritis   . Asthma 2009   last used inhaler  2009  . Depression   . H/O varicella   . History of gonorrhea   . Hx of chlamydia infection   . Hyperventilation   . Migraine 2010  . PID (pelvic inflammatory disease) 2012   GC and chlamydia     Current Outpatient Prescriptions on File Prior to Visit  Medication Sig Dispense Refill  . albuterol (PROVENTIL HFA;VENTOLIN HFA) 108 (90 Base) MCG/ACT inhaler Inhale 1-2 puffs into the lungs every 6 (six) hours as needed for wheezing or shortness of breath.     . diclofenac sodium (VOLTAREN) 1 % GEL Apply 2 g topically 4 (four) times daily. 3 Tube 1  . ferrous sulfate 325 (65 FE) MG tablet Take 1 tablet (325 mg total) by mouth 2 (two) times daily with a meal. 60 tablet 3  . pantoprazole (PROTONIX) 40 MG tablet Take 1 tablet (40 mg total) by mouth daily. 30 tablet 6  . Prenat-FeCbn-FeAspGl-FA-Omega (OB COMPLETE PETITE) 35-5-1-200 MG CAPS Take 1 capsule by mouth daily.     No current facility-administered medications on file prior to visit.     No past surgical history on file.  Allergies  Allergen Reactions  . Latex Hives    Social History   Social History  . Marital status: Single    Spouse name: N/A  . Number of children: N/A  . Years of education: 77   Occupational History  . 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 Smoking status: Former Smoker    Start date: 09/15/2012  . Smokeless tobacco: Never Used  . Alcohol use Yes     Comment: occ  . Drug use: No  . Sexual activity: Not on file   Other Topics Concern  . Not on file   Social History Narrative   Lives alone. Own apartment.     Family History  Problem Relation Age of Onset  . Hyperlipidemia Mother   . Hypertension Mother   . Kidney disease Mother   . Hypertension Father   . Hypertension Brother   . Hyperlipidemia Brother   . Diabetes Maternal Grandmother   . Diabetes Maternal Grandfather   . Cancer Maternal Aunt 58       Ovarian CA    BP 116/80    Pulse 76   Ht  (1.651 m)   Wt 180 lb (81.6 kg)   BMI 29.95 kg/m   Review of Systems: See HPI above.     Objective:  Physical Exam:  Gen: NAD, comfortable in exam room.  Right wrist: Small area of hypopigmentation of 1st dorsal compartment.  No other gross deformity, swelling, bruising. TTP over carpal tunnel, 1st dorsal compartment.  No other tenderness. FROM digits and wrist with 5/5 strength. Negative tinels, phalens. Negative finkelsteins. NVI distally including normal sensation.  Left wrist: No gross deformity, swelling, bruising. TTP over carpal tunnel, 1st dorsal compartment.  No other tenderness. FROM digits and wrist with 5/5 strength. Negative tinels, phalens. Negative finkelsteins. Sensation intact to light touch. NVI distally.   Assessment & Plan:  1. Bilateral wrist pain - Symptoms suggestive of carpal tunnel but her NCV/EMGs reviewed today negative for a peripheral neuropathy.  She is using braces, is s/p 1st dorsal compartment injections, tried voltaren gel.  We discussed possibility of false normal test, different neurologic condition accounting for this (though would be unusual).  Discussed trial of carpal tunnel injection on one side but with normal testing would prefer to go ahead with neurology consultation for their opinion first.  Consider occupational therapy (unlikely to help given her current symptoms), gabapentin.

## 2017-10-05 NOTE — Assessment & Plan Note (Signed)
Symptoms suggestive of carpal tunnel but her NCV/EMGs reviewed today negative for a peripheral neuropathy.  She is using braces, is s/p 1st dorsal compartment injections, tried voltaren gel.  We discussed possibility of false normal test, different neurologic condition accounting for this (though would be unusual).  Discussed trial of carpal tunnel injection on one side but with normal testing would prefer to go ahead with neurology consultation for their opinion first.  Consider occupational therapy (unlikely to help given her current symptoms), gabapentin.

## 2017-10-06 NOTE — Addendum Note (Signed)
Addended by: Kathi Simpers F on: 10/06/2017 09:31 AM   Modules accepted: Orders

## 2017-11-02 ENCOUNTER — Other Ambulatory Visit: Payer: Self-pay | Admitting: Student

## 2017-11-02 DIAGNOSIS — D509 Iron deficiency anemia, unspecified: Secondary | ICD-10-CM

## 2017-11-08 ENCOUNTER — Telehealth (HOSPITAL_COMMUNITY): Payer: Self-pay | Admitting: Lactation Services

## 2017-11-08 NOTE — Telephone Encounter (Signed)
Mekaylah called today complaining of bilateral lumps in breasts.  Baby is 7410 months old, and she has been separated from her for 3 days.  Wyatt Mageabitha has a [employer] DEBP and is pumping frequently, every 3 hrs.  Expressing 2-5 oz per pumping.   Denies fever, or flu like symptoms.  Denies reddened, warm area on breasts.   Instructed to use moist, warm compresses prior to double pumping, along with breast massage. Ice packs on breasts for 20 mins between pumping.  Instructed Mom to continue to observe and watch for signs and symptoms of mastitis. To call prn

## 2017-12-02 ENCOUNTER — Ambulatory Visit: Payer: Self-pay | Admitting: Family Medicine

## 2017-12-06 ENCOUNTER — Ambulatory Visit: Payer: Self-pay | Admitting: Physician Assistant

## 2017-12-10 ENCOUNTER — Ambulatory Visit: Payer: Medicaid Other | Admitting: Diagnostic Neuroimaging

## 2017-12-10 ENCOUNTER — Encounter: Payer: Self-pay | Admitting: Family Medicine

## 2017-12-10 ENCOUNTER — Encounter: Payer: Self-pay | Admitting: Diagnostic Neuroimaging

## 2017-12-10 ENCOUNTER — Ambulatory Visit: Payer: Medicaid Other | Admitting: Family Medicine

## 2017-12-10 VITALS — BP 116/69 | HR 83 | Wt 197.2 lb

## 2017-12-10 VITALS — BP 124/68 | HR 82 | Temp 98.4°F | Ht 65.0 in | Wt 197.0 lb

## 2017-12-10 DIAGNOSIS — M79641 Pain in right hand: Secondary | ICD-10-CM | POA: Diagnosis not present

## 2017-12-10 DIAGNOSIS — M25531 Pain in right wrist: Secondary | ICD-10-CM

## 2017-12-10 DIAGNOSIS — M79642 Pain in left hand: Secondary | ICD-10-CM | POA: Diagnosis not present

## 2017-12-10 DIAGNOSIS — M25532 Pain in left wrist: Secondary | ICD-10-CM | POA: Diagnosis not present

## 2017-12-10 DIAGNOSIS — R200 Anesthesia of skin: Secondary | ICD-10-CM | POA: Diagnosis not present

## 2017-12-10 DIAGNOSIS — R1030 Lower abdominal pain, unspecified: Secondary | ICD-10-CM

## 2017-12-10 NOTE — Progress Notes (Signed)
[facility]  PATIENT: Shelby Graves DOB: 02/08/1991  REFERRING CLINICIAN: S Hudnall HISTORY FROM: patient  REASON FOR VISIT: new consult    HISTORICAL  CHIEF COMPLAINT:  Chief Complaint  Patient presents with  . Bilateral wrist pain    rm 7, New Pt, "hands/wrist are stiff, difficult to use my phone, interfering with work at [employer], wrist gets fatigued easily"    HISTORY OF PRESENT ILLNESS:   26 year old female here for evaluation of bilateral wrist pain.  Since January 2018, following delivery of her baby daughter, patient had onset of intermittent stiffness, pain, numbness and tingling in her wrists and hands.  Symptoms worse with particular positions of her hands and wrists in particular activities.  Over time symptoms have gradually worsened.  Combing her hair, brushing her teeth, holding objects, using a keyboard, holding her daughter, seem to trigger and aggravate her symptoms.  Sometimes she wakes up from sleep with the symptoms.  Patient had EMG nerve conduction study in August 2018 which was normal.  Due to persistence of symptoms patient referred here for further evaluation of other possible neurologic etiologies.  Of note patient has constellation of other symptoms including insomnia, restlessness, headache, dizziness, depression, anxiety, racing thoughts, joint pain, joint swelling, aching muscles, constipation, shortness of breath, anemia, blood in stool, birthmarks fatigue and weight gain.  REVIEW OF SYSTEMS: Full 14 system review of systems performed and negative with exception of: as per HPI.  ALLERGIES: Allergies  Allergen Reactions  . Latex Hives    HOME MEDICATIONS: Outpatient Medications Prior to Visit  Medication Sig Dispense Refill  . albuterol (PROVENTIL HFA;VENTOLIN HFA) 108 (90 Base) MCG/ACT inhaler Inhale 1-2 puffs into the lungs every 6 (six) hours as needed for wheezing or shortness of breath.     . diclofenac sodium  (VOLTAREN) 1 % GEL Apply 2 g topically 4 (four) times daily. 3 Tube 1  . ferrous sulfate 325 (65 FE) MG tablet Take 1 tablet (325 mg total) by mouth 2 (two) times daily with a meal. 60 tablet 3  . pantoprazole (PROTONIX) 40 MG tablet Take 1 tablet (40 mg total) by mouth daily. 30 tablet 6  . Prenat-FeCbn-FeAspGl-FA-Omega (OB COMPLETE PETITE) 35-5-1-200 MG CAPS Take 1 capsule by mouth daily.     No facility-administered medications prior to visit.     PAST MEDICAL HISTORY: Past Medical History:  Diagnosis Date  . Anemia 2012  . Anxiety   . Arthritis   . Asthma 2009   last used inhaler 2009  . Depression   . H/O varicella   . History of gonorrhea   . Hx of chlamydia infection   . Hyperventilation   . Migraine 2010  . PID (pelvic inflammatory disease) 2012   GC and chlamydia     PAST SURGICAL HISTORY: No past surgical history on file.  FAMILY HISTORY: Family History  Problem Relation Age of Onset  . Hyperlipidemia Mother   . Hypertension Mother   . Kidney disease Mother   . Hypertension Father   . Hypertension Brother   . Hyperlipidemia Brother   . Diabetes Maternal Grandmother   . Diabetes Maternal Grandfather   . Cancer Maternal Aunt [id number]       Ovarian CA    SOCIAL HISTORY:  Social History   Socioeconomic History  . Marital status: Single    Spouse name: Not on file  . Number of children: Not on file  . Years of education: 3013  . Highest education level: Not  on file  Social Needs  . Financial resource strain: Not on file  . Food insecurity - worry: Not on file  . Food insecurity - inability: Not on file  . Transportation needs - medical: Not on file  . Transportation needs - non-medical: Not on file  Occupational History  . Occupation: Health and safety inspector: [employer]    Comment: [employer]  . Occupation: TEFL teacher   Tobacco Use  . Smoking status: Former Smoker    Start date: 09/15/2012  . Smokeless tobacco: Never Used  . Tobacco comment: quit  yrs ago  Substance and Sexual Activity  . Alcohol use: Yes    Comment: occ  . Drug use: No  . Sexual activity: Not on file  Other Topics Concern  . Not on file  Social History Narrative   Lives, 32 month old dgtr    Own apartment.    College graduate   Restaurant   1 child     PHYSICAL EXAM  GENERAL EXAM/CONSTITUTIONAL: Vitals:  Vitals:   12/10/17 0832  BP: 116/69  Pulse: 83  Weight: 197 lb 3.2 oz (89.4 kg)     Body mass index is 32.82 kg/m.  No exam data present  Patient is in no distress; well developed, nourished and groomed; neck is supple  CARDIOVASCULAR:  Examination of carotid arteries is normal; no carotid bruits  Regular rate and rhythm, no murmurs  Examination of peripheral vascular system by observation and palpation is normal  EYES:  Ophthalmoscopic exam of optic discs and posterior segments is normal; no papilledema or hemorrhages  MUSCULOSKELETAL:  Gait, strength, tone, movements noted in Neurologic exam below  NEUROLOGIC: MENTAL STATUS:  No flowsheet data found.  awake, alert, oriented to person, place and time  recent and remote memory intact  normal attention and concentration  language fluent, comprehension intact, naming intact,   fund of knowledge appropriate  CRANIAL NERVE:   2nd - no papilledema on fundoscopic exam  2nd, 3rd, 4th, 6th - pupils equal and reactive to light, visual fields full to confrontation, extraocular muscles intact, no nystagmus  5th - facial sensation symmetric  7th - facial strength symmetric  8th - hearing intact  9th - palate elevates symmetrically, uvula midline  11th - shoulder shrug symmetric  12th - tongue protrusion midline  MOTOR:   normal bulk and tone, full strength in the BUE, BLE  SENSORY:   normal and symmetric to light touch, temperature, vibration  SLIGHTLY DECR IN RIGHT HAND  COORDINATION:   finger-nose-finger, fine finger movements normal  REFLEXES:   deep  tendon reflexes present and symmetric  GAIT/STATION:   narrow based gait    DIAGNOSTIC DATA (LABS, IMAGING, TESTING) - I reviewed patient records, labs, notes, testing and imaging myself where available.  Lab Results  Component Value Date   WBC 8.0 01/05/2017   HGB 11.8 (L) 01/05/2017   HCT 32.3 (L) 01/05/2017   MCV 80.3 01/05/2017   PLT 204 01/05/2017      Component Value Date/Time   NA 136 07/16/2012 1447   K 4.0 07/16/2012 1447   CL 101 07/16/2012 1447   CO2 27 07/16/2012 1447   GLUCOSE 72 07/16/2012 1447   BUN 12 07/16/2012 1447   CREATININE 0.78 07/16/2012 1447   CALCIUM 9.2 07/16/2012 1447   PROT 7.2 07/16/2012 1447   ALBUMIN 4.2 07/16/2012 1447   AST 17 07/16/2012 1447   ALT 12 07/16/2012 1447   ALKPHOS 44 07/16/2012 1447  BILITOT 0.5 07/16/2012 1447   GFRNONAA >60 07/24/2011 0142   GFRAA >60 07/24/2011 0142   No results found for: CHOL, HDL, LDLCALC, LDLDIRECT, TRIG, CHOLHDL No results found for: XBJY7WHGBA1C No results found for: VITAMINB12 Lab Results  Component Value Date   TSH 0.970 02/15/2013     07/31/17 EMG/NCS NERVE CONDUCTION STUDY: Bilateral median and ulnar motor responses are normal.  Left peroneal and left tibial motor responses are normal. Bilateral ulnar and left tibial F wave latencies are normal. Bilateral radial, bilateral median, bilateral ulnar, left sural and left superficial peroneal sensory responses are normal.  NEEDLE ELECTROMYOGRAPHY: Needle examination of left lower extremity vastus medialis, tibialis anterior, gastrocnemius and left lumbar paraspinal muscles are normal.  IMPRESSION:  This is a normal study. No electrodiagnostic evidence of large fiber neuropathy or left lumbar radiculopathy at this time.       ASSESSMENT AND PLAN  26 y.o. year old female here with new onset of numbness, tingling, pain, stiffness in her wrists and fingers since January 2018.  Neurologic examination and EMG are unremarkable.  Suspect  musculoskeletal etiology of pain.  Other contributing factors include insomnia (3-4 hours sleep per night), stress, and family situation.  No further neurologic testing recommended at this time.   Dx:  1. Bilateral hand pain   2. Pain in both wrists   3. Hand numbness      PLAN:  WRIST PAIN - continue conservative mgmt of musculoskeletal pain - optimize nutrition, physical activity, stress mgmt, sleep hygiene  Return if symptoms worsen or fail to improve, for return to PCP.    Suanne MarkerVIKRAM R. Eloni Darius, MD 12/10/2017, 8:43 AM Certified in Neurology, Neurophysiology and Neuroimaging  [facility] [address], [address] [place], [place] [address] [phone]

## 2017-12-10 NOTE — Progress Notes (Signed)
Subjective:    Patient ID: Karl Pockabitha Neubert, female    DOB: 02/17/1991, 26 y.o.   MRN: [id number]   CC: Lower abdominal pain, menstrual cramping, vulvar irritation, spotting  HPI: Patient is a 26 yo female who presents today complaining of menstrual cramping, spotting and vaginal irritation  Menstrual cramping: Patient reports menstrual cycle resumed in September. She has been having regular menstrual cycle with   some spotting in between cycle. Patient has been experiencing menstrual pain for which se has been taking ibuprofen and warm shower with some relief in her symptoms. Patient was told prior to her pregnancy that she had a ovarian cyst which was causing some of the pain she was having in her lower abdomen. Patient was also diagnosed with PID during her pregnancy and was told she could have some scarring secondary to it.  Spotting: Patient reports spotting for a few days (around 5 days) between cycle in the past two months. Patient has a history of abnormal uterine bleeding prior to her pregnancy and was birth control pills which she did not tolerate very well and reported HA, dizziness.  Vaginal irritation: Patient reports noticing small irritation/lesion on her right labial majora. Patient has been shaving and report increased irritation. She was concerned about possible STD although she has not been sexually active for over a year.   Smoking status reviewed   ROS: all other systems were reviewed and are negative other than in the HPI   Past Medical History:  Diagnosis Date  . Anemia 2012  . Anxiety   . Arthritis   . Asthma 2009   last used inhaler 2009  . Depression   . H/O varicella   . History of gonorrhea   . Hx of chlamydia infection   . Hyperventilation   . Migraine 2010  . PID (pelvic inflammatory disease) 2012   GC and chlamydia     History reviewed. No pertinent surgical history.  Past medical history, surgical, family, and social history reviewed and  updated in the EMR as appropriate.  Objective:  BP 124/68   Pulse 82   Temp 98.4 F (36.9 C) (Oral)   Ht 5\' 5"  (1.651 m)   Wt 197 lb (89.4 kg)   LMP 10/28/2017   SpO2 98%   BMI 32.78 kg/m   Vitals and nursing note reviewed  General: NAD, pleasant, able to participate in exam Cardiac: RRR, normal heart sounds, no murmurs. 2+ radial and PT pulses bilaterally Respiratory: CTAB, normal effort, No wheezes, rales or rhonchi Abdomen: soft, mild tenderness to palpation in lower abdomen, nondistended, no hepatic or splenomegaly, +BS Female genitalia: normal external genitalia, vulva, vagina, cervix, uterus and adnexa Vulva: normal appearing vulva with no masses, tenderness or lesions Extremities: no edema or cyanosis. WWP. Skin: warm and dry, no rashes noted Neuro: alert and oriented x4, no focal deficits Psych: Normal affect and mood   Assessment & Plan:   #Pelvic cramping and pain Likely related to menstrual cycle that restarted two months ago. Patient has a history of corpus luteal cyst in the past seen on ultrasound in 2016 for which she received OCPs. Patient is 26 yo and cyst is likely physiologic. She is not sexually active low likelihood for ectopic pregnancy. Patient has a history of PID with possible tubal/pelvic scarring. Patient also has a family history of fibroid (mother had hysterectomy in 2014).  --Will order transvaginal pelvic US --Patient has an appointment with GYN on 12/26 and will follow up for results  #  Periodic intermenstrual spotting Likely related to cycle restarting three month ago. Low likely for ectopic pregnancy. Will follow up on imaging for better assessment of endometrium. Will discuss option for management after ultrasound  #Vulvar irritation Vulvar exam did not revealed any labia majora lesions or irritation. Pubic hair growth noted on the mucosal aspect of right labia likely causing irritation worst after shaving. No sign of in lesion concerning for  STD. Patient was reassured. Advice given to consider waxing or epilation for more sensitive area to minimize risk of ingrown hair or irritation  Lovena NeighboursAbdoulaye Vashon Riordan, MD [facility] PGY-2

## 2017-12-10 NOTE — Patient Instructions (Signed)
Thank you for coming to see [place] at [facility]. I hope we have been able to provide you high quality care today.  You may receive a patient satisfaction survey over the next few weeks. We would appreciate your feedback and comments so that we may continue to improve ourselves and the health of our patients.  - try to improve nutrition, stress mgmt, physical activity gradually  - consider whole 30 diet x 30 days   ~~~~~~~~~~~~~~~~~~~~~~~~~~~~~~~~~~~~~~~~~~~~~~~~~~~~~~~~~~~~~~~~~  DR. PENUMALLI'S GUIDE TO HAPPY AND HEALTHY LIVING These are some of my general health and wellness recommendations. Some of them may apply to you better than others. Please use common sense as you try these suggestions and feel free to ask me any questions.   ACTIVITY/FITNESS Mental, social, emotional and physical stimulation are very important for brain and body health. Try learning a new activity (arts, music, language, sports, games).  Keep moving your body to the best of your abilities. You can do this at home, inside or outside, the park, community center, gym or anywhere you like. Consider a physical therapist or personal trainer to get started. Consider the app Sworkit. Fitness trackers such as smart-watches, smart-phones or Fitbits can help as well.   NUTRITION Eat more plants: colorful vegetables, nuts, seeds and berries.  Eat less sugar, salt, preservatives and processed foods.  Avoid toxins such as cigarettes and alcohol.  Drink water when you are thirsty. Warm water with a slice of lemon is an excellent morning drink to start the day.  Consider these websites for more information The Nutrition Source (https://www.henry-hernandez.biz/) Precision Nutrition (WindowBlog.ch)   RELAXATION Consider practicing mindfulness meditation or other relaxation techniques such as deep breathing, prayer, yoga, tai chi, massage. See website mindful.org  or the apps Headspace or Calm to help get started.   SLEEP Try to get at least 7-8+ hours sleep per day. Regular exercise and reduced caffeine will help you sleep better. Practice good sleep hygeine techniques. See website sleep.org for more information.   PLANNING Prepare estate planning, living will, healthcare POA documents. Sometimes this is best planned with the help of an attorney. Theconversationproject.org and agingwithdignity.org are excellent resources.

## 2017-12-10 NOTE — Patient Instructions (Signed)
Abnormal Uterine Bleeding Abnormal uterine bleeding means bleeding more than usual from your uterus. It can include:  Bleeding between periods.  Bleeding after sex.  Bleeding that is heavier than normal.  Periods that last longer than usual.  Bleeding after you have stopped having your period (menopause).  There are many problems that may cause this. You should see a doctor for any kind of bleeding that is not normal. Treatment depends on the cause of the bleeding. Follow these instructions at home:  Watch your condition for any changes.  Do not use tampons, douche, or have sex, if your doctor tells you not to.  Change your pads often.  Get regular well-woman exams. Make sure they include a pelvic exam and cervical cancer screening.  Keep all follow-up visits as told by your doctor. This is important. Contact a doctor if:  The bleeding lasts more than one week.  You feel dizzy at times.  You feel like you are going to throw up (nauseous).  You throw up. Get help right away if:  You pass out.  You have to change pads every hour.  You have belly (abdominal) pain.  You have a fever.  You get sweaty.  You get weak.  You passing large blood clots from your vagina. Summary  Abnormal uterine bleeding means bleeding more than usual from your uterus.  There are many problems that may cause this. You should see a doctor for any kind of bleeding that is not normal.  Treatment depends on the cause of the bleeding. This information is not intended to replace advice given to you by your health care provider. Make sure you discuss any questions you have with your health care provider. Document Released: 10/06/2009 Document Revised: 12/03/2016 Document Reviewed: 12/03/2016 Elsevier Interactive Patient Education  2017 Elsevier Inc.  Pelvic Pain, Female Pelvic pain is pain in your lower abdomen, below your belly button and between your hips. The pain may start suddenly  (acute), keep coming back (recurring), or last a long time (chronic). Pelvic pain that lasts longer than six months is considered chronic. Pelvic pain may affect your:  Reproductive organs.  Urinary system.  Digestive tract.  Musculoskeletal system.  There are many potential causes of pelvic pain. Sometimes, the pain can be a result of digestive or urinary conditions, strained muscles or ligaments, or even reproductive conditions. Sometimes the cause of pelvic pain is not known. Follow these instructions at home:  Take over-the-counter and prescription medicines only as told by your health care provider.  Rest as told by your health care provider.  Do not have sex it if hurts.  Keep a journal of your pelvic pain. Write down: ? When the pain started. ? Where the pain is located. ? What seems to make the pain better or worse, such as food or your menstrual cycle. ? Any symptoms you have along with the pain.  Keep all follow-up visits as told by your health care provider. This is important. Contact a health care provider if:  Medicine does not help your pain.  Your pain comes back.  You have new symptoms.  You have abnormal vaginal discharge or bleeding, including bleeding after menopause.  You have a fever or chills.  You are constipated.  You have blood in your urine or stool.  You have foul-smelling urine.  You feel weak or lightheaded. Get help right away if:  You have sudden severe pain.  Your pain gets steadily worse.  You have severe pain  along with fever, nausea, vomiting, or excessive sweating.  You lose consciousness. This information is not intended to replace advice given to you by your health care provider. Make sure you discuss any questions you have with your health care provider. Document Released: 11/05/2004 Document Revised: 01/03/2016 Document Reviewed: 09/29/2015 Elsevier Interactive Patient Education  2018 [employer]

## 2017-12-17 ENCOUNTER — Encounter: Payer: Self-pay | Admitting: Obstetrics and Gynecology

## 2017-12-17 ENCOUNTER — Other Ambulatory Visit: Payer: Self-pay

## 2017-12-17 ENCOUNTER — Ambulatory Visit (HOSPITAL_COMMUNITY)
Admission: RE | Admit: 2017-12-17 | Discharge: 2017-12-17 | Disposition: A | Discharge status: Home / Self Care | Payer: Medicaid Other | Source: Ambulatory Visit | Attending: Family Medicine | Admitting: Family Medicine

## 2017-12-17 ENCOUNTER — Ambulatory Visit (INDEPENDENT_AMBULATORY_CARE_PROVIDER_SITE_OTHER): Payer: Medicaid Other | Admitting: Obstetrics and Gynecology

## 2017-12-17 DIAGNOSIS — N939 Abnormal uterine and vaginal bleeding, unspecified: Secondary | ICD-10-CM | POA: Insufficient documentation

## 2017-12-17 DIAGNOSIS — N938 Other specified abnormal uterine and vaginal bleeding: Secondary | ICD-10-CM

## 2017-12-17 DIAGNOSIS — R1030 Lower abdominal pain, unspecified: Secondary | ICD-10-CM | POA: Diagnosis present

## 2017-12-17 DIAGNOSIS — R102 Pelvic and perineal pain: Secondary | ICD-10-CM | POA: Insufficient documentation

## 2017-12-17 NOTE — Progress Notes (Signed)
Ms Shelby Graves presents for follow up of her pelvic/back pain. She reports problems with this prior to her pregnancy but has seemed to increased since her delivery this past Jan. She reports that her cycles returned this August. Spotting in August/ regular cycle in Sept/Oct/November. BTB since Oct. No cycle in December Not sexual active. Still breast feeding  GYN U/S today unremarkable except for some endometrial changes. No ovarian cyst or fibroids note.  PE AF VSS Lungs clear Heart RRR Abd soft + BS  A/P DUB        Pelvic pain  Etiology uncertain. Pt concerned about endometriosis. Dx and treatment for endometriosis reviewed with pt. No certain about hormonal manipulation d/t side effects in the past on OCP's, Depo Provera. As noted still breast feeding. Discussed progesterone only pills. She is going to think about this and check cost. Could be related to scar tissue as pt has H/O PID in the past. GI and MS causes have not been completely eliminated either.  Pt to think about progesterone only OCP's and let us know.

## 2017-12-17 NOTE — Progress Notes (Signed)
Complains of pelvic pain 9/10 x 1++ yr. US done 12/17/17

## 2018-01-08 ENCOUNTER — Ambulatory Visit: Payer: Self-pay

## 2018-04-06 NOTE — Progress Notes (Signed)
[facility] Phone: [phone]   Date of Visit: 04/07/2018   HPI:  Cough:  - reports of cough for the past three months - she was treated for sinus infection with amoxicillin about 1 month ago.  - initially cough was productive but now more dry - initially the cough made her gag but this has now resolved  - she has been having nasal congestion and post nasal drip recently as well as watery/itchy eyes and sneezing.  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 She requests a refill of this  - she has tried Benadryl which does not work. She started taking Claritin yesterday  - no fevers or chills  Urinary Incontinence:  - reports of urinary incontinence since having her daughter in Jan 2018; reports she had a second degree tear after delivery. Incontinence is mainly with coughing and lifting heavy objects - no dysuria or urinary frequency   Right Sided Sciatica:  - reports of having issues with sciatica during pregnancy but has never tried any medications for this - reports that since she has gained weight, she has noticed her symptoms more. - she has low back pain with radiation of pain down the right lower extremity  - denies any weakness, no urinary retention, she has chronic stress incontinence at baseline, no numbness or tingling of the lower extremities. No bowel incontinence.  - she is trying to increase her physical activity. This may have exacerbated her symptoms.  - it is difficult for her to go to sleep recently because of the pain   ROS: See HPI.  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 Tenderness over the right SI joint, mild tenderness around the greater trochanter (right). Normal range of motion of the right hip. FABER produces some pain on the right side. Straight leg raise is negative. Normal strength of the lower extremities bilaterally. Normal sensation to light touch in bilateral lower extremities. Normal gait  PSYCH: Mood and affect euthymic, normal rate and volume of speech NEURO: Awake, alert, no focal deficits grossly, normal speech  ASSESSMENT/PLAN: Chronic cough Symptoms likely initially related to postviral cough but now allergy related. Vitals stable. Patient in no distress. No signs of wheezing noted. - continue Claritin daily which she started yesterday  - start Flonase nasal spray daily - refilled PRN albuterol due to patient's history of allergy related asthma  Stress incontinence of urine Started after having vaginal delivery in Jan 2018. Has tried [place] exercises at home. Will refer to pelvic rehab. Discussed the importance of physical activity and weight loss to help with this as well. Ordered UA which was overall unremarkable.  - Ambulatory referral to Physical Therapy - POCT urinalysis dipstick - POCT UA - Microscopic Only  Chronic right-sided low back pain with left-sided sciatica No red flags. Discussed doing back exercises. Can do trial of Gabapentin 100mg  qhs for symptoms. Can go up to 300mg   qhs if tolerated. Of note, she is going to stop breastfeeding.   Follow up in 4-6 weeks  Palma HolterKanishka G Gunadasa, MD PGY 3 [facility]

## 2018-04-07 ENCOUNTER — Encounter: Payer: Self-pay | Admitting: Internal Medicine

## 2018-04-07 ENCOUNTER — Ambulatory Visit: Payer: Medicaid Other | Admitting: Internal Medicine

## 2018-04-07 ENCOUNTER — Other Ambulatory Visit: Payer: Self-pay

## 2018-04-07 VITALS — BP 120/70 | HR 64 | Temp 98.1°F | Wt 200.0 lb

## 2018-04-07 DIAGNOSIS — M5442 Lumbago with sciatica, left side: Secondary | ICD-10-CM

## 2018-04-07 DIAGNOSIS — R05 Cough: Secondary | ICD-10-CM

## 2018-04-07 DIAGNOSIS — N393 Stress incontinence (female) (male): Secondary | ICD-10-CM | POA: Diagnosis not present

## 2018-04-07 DIAGNOSIS — R053 Chronic cough: Secondary | ICD-10-CM

## 2018-04-07 DIAGNOSIS — G8929 Other chronic pain: Secondary | ICD-10-CM

## 2018-04-07 LAB — POCT UA - MICROSCOPIC ONLY

## 2018-04-07 LAB — POCT URINALYSIS DIP (MANUAL ENTRY)
BILIRUBIN UA: NEGATIVE
BILIRUBIN UA: NEGATIVE mg/dL
GLUCOSE UA: NEGATIVE mg/dL
Leukocytes, UA: NEGATIVE
NITRITE UA: NEGATIVE
Protein Ur, POC: NEGATIVE mg/dL
Spec Grav, UA: 1.02 (ref 1.010–1.025)
Urobilinogen, UA: 0.2 E.U./dL
pH, UA: 7 (ref 5.0–8.0)

## 2018-04-07 MED ORDER — ALBUTEROL SULFATE HFA 108 (90 BASE) MCG/ACT IN AERS: 1.0000 | INHALATION_SPRAY | Freq: Four times a day (QID) | RESPIRATORY_TRACT | 0 refills | Status: DC | PRN

## 2018-04-07 MED ORDER — FLUTICASONE PROPIONATE 50 MCG/ACT NA SUSP: 2.0000 | Freq: Every day | NASAL | 0 refills | Status: DC

## 2018-04-07 MED ORDER — GABAPENTIN 100 MG PO CAPS: 100.0000 mg | ORAL_CAPSULE | Freq: Every day | ORAL | 0 refills | Status: DC

## 2018-04-07 NOTE — Patient Instructions (Addendum)
1) For cough, I think this is related to allergies - continue to take Claritin daily - start Flonase nasal spray daily   2) Stress Incontinence  - weight loss will  - we made a referral to physical therapy (pelvic rehab)   3) for sciatica: try Gabapentin 100mg  at bedtime   Back Exercises If you have pain in your back, do these exercises 2-3 times each day or as told by your doctor. When the pain goes away, do the exercises once each day, but repeat the steps more times for each exercise (do more repetitions). If you do not have pain in your back, do these exercises once each day or as told by your doctor. Exercises Single Knee to Chest  Do these steps 3-5 times in a row for each leg: 1. Lie on your back on a firm bed or the floor with your legs stretched out. 2. Bring one knee to your chest. 3. Hold your knee to your chest by grabbing your knee or thigh. 4. Pull on your knee until you feel a gentle stretch in your lower back. 5. Keep doing the stretch for 10-30 seconds. 6. Slowly let go of your leg and straighten it.  Pelvic Tilt  Do these steps 5-10 times in a row: 1. Lie on your back on a firm bed or the floor with your legs stretched out. 2. Bend your knees so they point up to the ceiling. Your feet should be flat on the floor. 3. Tighten your lower belly (abdomen) muscles to press your lower back against the floor. This will make your tailbone point up to the ceiling instead of pointing down to your feet or the floor. 4. Stay in this position for 5-10 seconds while you gently tighten your muscles and breathe evenly.  Cat-Cow  Do these steps until your lower back bends more easily: 1. Get on your hands and knees on a firm surface. Keep your hands under your shoulders, and keep your knees under your hips. You may put padding under your knees. 2. Let your head hang down, and make your tailbone point down to the floor so your lower back is round like the back of a cat. 3. Stay in  this position for 5 seconds. 4. Slowly lift your head and make your tailbone point up to the ceiling so your back hangs low (sags) like the back of a cow. 5. Stay in this position for 5 seconds.  Press-Ups  Do these steps 5-10 times in a row: 1. Lie on your belly (face-down) on the floor. 2. Place your hands near your head, about shoulder-width apart. 3. While you keep your back relaxed and keep your hips on the floor, slowly straighten your arms to raise the top half of your body and lift your shoulders. Do not use your back muscles. To make yourself more comfortable, you may change where you place your hands. 4. Stay in this position for 5 seconds. 5. Slowly return to lying flat on the floor.  Bridges  Do these steps 10 times in a row: 1. Lie on your back on a firm surface. 2. Bend your knees so they point up to the ceiling. Your feet should be flat on the floor. 3. Tighten your butt muscles and lift your butt off of the floor until your waist is almost as high as your knees. If you do not feel the muscles working in your butt and the back of your thighs, slide your feet  1-2 inches farther away from your butt. 4. Stay in this position for 3-5 seconds. 5. Slowly lower your butt to the floor, and let your butt muscles relax.  If this exercise is too easy, try doing it with your arms crossed over your chest. Belly Crunches  Do these steps 5-10 times in a row: 1. Lie on your back on a firm bed or the floor with your legs stretched out. 2. Bend your knees so they point up to the ceiling. Your feet should be flat on the floor. 3. Cross your arms over your chest. 4. Tip your chin a little bit toward your chest but do not bend your neck. 5. Tighten your belly muscles and slowly raise your chest just enough to lift your shoulder blades a tiny bit off of the floor. 6. Slowly lower your chest and your head to the floor.  Back Lifts Do these steps 5-10 times in a row: 1. Lie on your belly  (face-down) with your arms at your sides, and rest your forehead on the floor. 2. Tighten the muscles in your legs and your butt. 3. Slowly lift your chest off of the floor while you keep your hips on the floor. Keep the back of your head in line with the curve in your back. Look at the floor while you do this. 4. Stay in this position for 3-5 seconds. 5. Slowly lower your chest and your face to the floor.  Contact a doctor if:  Your back pain gets a lot worse when you do an exercise.  Your back pain does not lessen 2 hours after you exercise. If you have any of these problems, stop doing the exercises. Do not do them again unless your doctor says it is okay. Get help right away if:  You have sudden, very bad back pain. If this happens, stop doing the exercises. Do not do them again unless your doctor says it is okay. This information is not intended to replace advice given to you by your health care provider. Make sure you discuss any questions you have with your health care provider. Document Released: 01/11/2011 Document Revised: 05/16/2016 Document Reviewed: 02/02/2015 Elsevier Interactive Patient Education  [employer]2018 Elsevier Inc.

## 2018-05-19 ENCOUNTER — Ambulatory Visit: Payer: Medicaid Other | Admitting: Family Medicine

## 2018-06-15 ENCOUNTER — Encounter: Payer: Self-pay | Admitting: Family Medicine

## 2018-06-16 ENCOUNTER — Encounter: Payer: Self-pay | Admitting: Family Medicine

## 2018-06-16 ENCOUNTER — Ambulatory Visit: Payer: Medicaid Other | Admitting: Family Medicine

## 2018-06-16 ENCOUNTER — Other Ambulatory Visit: Payer: Self-pay

## 2018-06-16 VITALS — BP 110/70 | HR 75 | Temp 98.3°F | Wt 193.0 lb

## 2018-06-16 DIAGNOSIS — N926 Irregular menstruation, unspecified: Secondary | ICD-10-CM | POA: Diagnosis not present

## 2018-06-16 DIAGNOSIS — R5383 Other fatigue: Secondary | ICD-10-CM | POA: Diagnosis not present

## 2018-06-16 DIAGNOSIS — F439 Reaction to severe stress, unspecified: Secondary | ICD-10-CM

## 2018-06-16 DIAGNOSIS — B009 Herpesviral infection, unspecified: Secondary | ICD-10-CM

## 2018-06-16 HISTORY — DX: Herpesviral infection, unspecified: B00.9

## 2018-06-16 LAB — POCT URINE PREGNANCY: Preg Test, Ur: NEGATIVE

## 2018-06-16 NOTE — Progress Notes (Signed)
    Subjective:    Patient ID: Shelby Graves, female    DOB: 09/27/1991, 27 y.o.   MRN: [id number]   CC: fatigue  Reports missed period and she is concerned she is pregnant, especially since she feels tired.   Her daughter recently dx with flu, she is concerned maybe she is coming down with the flu. Daughter is 18 months, recently started going to daycare and was in playplace at [employer]. Daughter recovering well. [name] reports headaches, fatigue, muscle aches, decreased appetite, mild nausea, some loose stools. Denies vomiting. No fevers. 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   Dx w/ HSV2 1 month ago, she is on valtrex. She has had 2 outbreaks in the past month. She is very upset over diagnosis and has been fighting with partner about it. She is very stressed out. She endorses a lot of stress in her life the past year. She is interested in seeing a therapist "just to talk" but does not want to meet with our behavioral health team today.  Recent UTI, finished course of macrobid. Denies burning with urination, flank pain. Recent URI with cough. Denies SOB or productive cough at this time.  She has seasonal allergies and is using flonase daily and benadryl as needed  Smoking status reviewed- former smoker  Review of Systems- see HPI, additionally no SI or HI   Objective:  BP 110/70   Pulse 75   Temp 98.3 F (36.8 C) (Oral)   Wt 193 lb (87.5 kg)   LMP 05/05/2018   SpO2 99%   BMI 32.12 kg/m  Vitals and nursing note reviewed  General: well nourished, in no acute distress HEENT: normocephalic, TM's visualized bilaterally without bulging or exudate, no scleral icterus or conjunctival pallor, no nasal discharge but boggy swollen turbinates bilaterally. MMM.Good dentition without erythema or discharge noted in posterior oropharynx Neck: supple, non-tender, without lymphadenopathy. No nuchal rigidity.  Cardiac: RRR, clear S1 and S2, no  murmurs, rubs, or gallops Respiratory: clear to auscultation bilaterally, no increased work of breathing Extremities: no edema or cyanosis. Skin: warm and dry, no rashes noted Neuro: alert and oriented, no focal deficits. Normal gait.    Assessment & Plan:   1. Missed period Upreg in office negative.  - POCT urine pregnancy  2. Stress Patient requesting "someone to talk to" about hectic stressful past year. She is a single mother. [facility] but took information with her to call. She is requesting referral to a therapist. Referral made.  - Ambulatory referral to Psychology  3. Fatigue, unspecified type Patient concerned for flu. She has been exposed to her daughter who was flu positive last week. Discussed that if she has flu it is unlikely to be helped by tamiflu. She has 2 recent HSV outbreaks (first outbreak) and discussed this can bring flu like symptoms and fatigue. She also endorses a lot of stress and drama in her life. She plans to talk with therapist. Asked her to follow up in 2-3 weeks if fatigue persists, could check CBC and TSH at that time, however feel this is likely related to life stress, HSV, and possible viral illness from daughter. Patient verbalized understanding and agreement with plan.   Return if symptoms worsen or fail to improve.   Dolores PattyAngela Uliana Brinker, DO Family Medicine Resident PGY-2

## 2018-06-16 NOTE — Patient Instructions (Addendum)
   It was nice to meet you today! Sorry you are not feeling well.  Please return to be seen if you develop fever >100.4 or new worrisome symptoms.  I have referred you to psychology to meet with a therapist. You will be called to schedule an appointment.  Please also feel free to utilize our behavioral health team here at our office.   If you have questions or concerns please do not hesitate to call at [phone].  Dolores PattyAngela Lajada Janes, DO PGY-2, [facility] 06/16/2018 11:21 AM

## 2018-06-23 ENCOUNTER — Telehealth: Payer: Self-pay

## 2018-06-23 NOTE — Telephone Encounter (Signed)
Patient received call about scheduling with a therapist from referral placed on 06/16/18. Appt they offered her is March of 2020. Is there any other options?  Call back is [phone]  Ples SpecterAlisa Brake, RN [facility] RN)

## 2018-06-23 NOTE — Telephone Encounter (Signed)
Patient last saw Dr. Wonda Oldsiccio 6/25 and requested a therapist to talk to regarding stressful past year, patient is also single mother. Referral was placed but was told next available appointment for Medicaid would be March 2020. Is there any way [facility] could [facility] before then? Thanks!

## 2018-06-24 NOTE — Progress Notes (Signed)
Type of Service: Clinical Social Work  consult reference connecting patient to therapy resources.   LCSW spoke with patient to assess the need.  The following was discussed; community resources and  [facility].  Patient appreciative of talking with LCSW. Patient confirmed e-mail address to send resources.     Plan: LCSW emailed patient a list of therapy resources.  Patient will also check her work schedule and make an appointment with Dr. Pascal LuxKane until she is able to connect to a therapist ongoing.   LCSW will F/U with patient in 7 to 10 days if she has not scheduled an appointment.  Sammuel Hineseborah Anthone Prieur, LCSW Licensed Clinical Social Worker [facility]   [phone] 3:01 PM

## 2018-07-06 ENCOUNTER — Encounter: Payer: Self-pay | Admitting: Family Medicine

## 2018-07-06 ENCOUNTER — Other Ambulatory Visit: Payer: Self-pay | Admitting: Family Medicine

## 2018-07-06 MED ORDER — ACYCLOVIR 5 % EX OINT: 1.0000 "application " | TOPICAL_OINTMENT | CUTANEOUS | 2 refills | Status: DC

## 2018-07-09 MED ORDER — ZOVIRAX 5 % EX OINT: 1.0000 "application " | TOPICAL_OINTMENT | CUTANEOUS | 3 refills | Status: DC

## 2018-07-09 NOTE — Addendum Note (Signed)
Addended by: Jone BasemanFLEEGER, Joeph Szatkowski D on: 07/09/2018 09:23 AM   Modules accepted: Orders

## 2018-07-09 NOTE — Progress Notes (Signed)
Medicaid prefers brand name.   Contacted pharmacy and they will need a new script sent in that says brand name medically necessary (DAW). Shelby Graves, [name], CMA

## 2018-07-13 NOTE — Progress Notes (Signed)
Subjective:   Patient ID: Shelby Graves    DOB: 03/20/1991, 27 y.o. female   MRN: [id number]  Shelby Graves is a 27 y.o. female with a history of iron deficiency, mood d/o, DUB, HSV2 here for   GERD: Patient complains of dyspepsia and nausea. Symptoms have been present for approximately a few years. Risk factors present for GERD include obesity. Chronically on Protonix with good effect. Requesting refill.   LOWER ABDOMINAL PAIN Had PID a few years ago. Hard to go to bathroom. Urgency. Urinary incontinence since having her.  Pain began a few weeks ago. Describes as cramping in her lower abdomen. Medications tried: none Similar pain before: before she got pregnant. History of heavy periods. [facility] for cysts form ~2016-17. Once off BC, passed heavy clots.  Prior abdominal surgeries: none Not currently on birth control. Tried depo, OCPs. Doesn't want implant or nuvaring. Not interested in birth control at this time.  Symptoms Nausea/vomiting: nauseous, no vomiting. Diarrhea: few episodes in the past few weeks of loose watery bowel movements. Associated with cramping. Relief with defecation. Constipation: yes. Will sometimes have urgency to go but not much will come out. Blood in stool: yes. Notice blood on tissue when wipes. Occasionally will see it mixed in with her stool.   Blood in vomit: N/A Fever: no Dysuria: no Loss of appetite: yes Weight loss: yes (was 200lb, 03/2018. Today 191. Although previously 160s-180s)  Vaginal Bleeding: no Missed menstrual period: no LMP 7/6-7/10. Not sexually active since last pregnancy test. Unknown FH of bowel diseases.  Review of Systems:  Per HPI.  PMFSH, medications and smoking status reviewed.  Objective:   BP 98/68   Pulse 73   Temp 98.2 F (36.8 C) (Oral)   Ht 5\' 5"  (1.651 m)   Wt 191 lb 3.2 oz (86.7 kg)   SpO2 99%   BMI 31.82 kg/m  Vitals and nursing note reviewed.  General: well nourished, well developed, anxious appearing  with non-toxic appearance CV: regular rate and rhythm without murmurs, rubs, or gallops, no lower extremity edema Lungs: clear to auscultation bilaterally with normal work of breathing Abdomen: soft, non-distended, no masses or organomegaly palpable, normoactive bowel sounds. Slight TTP in lower quadrants bilaterally. No rebounding or guarding. Skin: warm, dry, no rashes or lesions Extremities: warm and well perfused, normal tone MSK: ROM grossly intact, strength intact, gait normal Neuro: Alert and oriented  Assessment & Plan:   Lower abdominal pain Given history of mood disorder and recent stress over new STI diagnosis, could be IBS. Blood present on toilet paper could also be irritation of current HSV outbreak with strain from constipation. Will obtain FOBT. Suspect nausea is related to GERD, will refill Protonix. As she has not been sexually active since last UPreg and has not missed a period, will defer UPreg at this time. Encouraged use of Miralax for constipation. If patient still experiencing symptoms despite OTC pain relief and negative FOBT, wiill likely need pelvic ultrasound to rule out structural pathology.   Orders Placed This Encounter  Procedures  . Fecal occult blood, imunochemical([facility])    Standing Status:   Future    Standing Expiration Date:   07/15/2019   Meds ordered this encounter  Medications  . ferrous sulfate 325 (65 FE) MG tablet    Sig: Take 1 tablet (325 mg total) by mouth 2 (two) times daily with a meal.    Dispense:  60 tablet    Refill:  3  . pantoprazole (PROTONIX) 40  MG tablet    Sig: Take 1 tablet (40 mg total) by mouth daily.    Dispense:  30 tablet    Refill:  6  . acyclovir ointment (ZOVIRAX) 5 %    Sig: Apply 1 application topically every 3 (three) hours as needed.    Dispense:  30 g    Refill:  0    Ellwood Dense, DO PGY-2, [facility] 07/14/2018 8:51 PM

## 2018-07-14 ENCOUNTER — Ambulatory Visit: Payer: Medicaid Other | Admitting: Psychology

## 2018-07-14 ENCOUNTER — Encounter: Payer: Self-pay | Admitting: Family Medicine

## 2018-07-14 ENCOUNTER — Ambulatory Visit (INDEPENDENT_AMBULATORY_CARE_PROVIDER_SITE_OTHER): Payer: Medicaid Other | Admitting: Family Medicine

## 2018-07-14 ENCOUNTER — Other Ambulatory Visit: Payer: Self-pay

## 2018-07-14 VITALS — BP 98/68 | HR 73 | Temp 98.2°F | Ht 65.0 in | Wt 191.2 lb

## 2018-07-14 DIAGNOSIS — R1030 Lower abdominal pain, unspecified: Secondary | ICD-10-CM | POA: Diagnosis not present

## 2018-07-14 DIAGNOSIS — D509 Iron deficiency anemia, unspecified: Secondary | ICD-10-CM

## 2018-07-14 DIAGNOSIS — F39 Unspecified mood [affective] disorder: Secondary | ICD-10-CM

## 2018-07-14 MED ORDER — ACYCLOVIR 5 % EX OINT
1.0000 | TOPICAL_OINTMENT | CUTANEOUS | 0 refills | Status: DC | PRN
Start: 2018-07-14 — End: 2018-11-05

## 2018-07-14 MED ORDER — PANTOPRAZOLE SODIUM 40 MG PO TBEC: 40.0000 mg | DELAYED_RELEASE_TABLET | Freq: Every day | ORAL | 6 refills | Status: DC

## 2018-07-14 MED ORDER — FERROUS SULFATE 325 (65 FE) MG PO TABS: 325.0000 mg | ORAL_TABLET | Freq: Two times a day (BID) | ORAL | 3 refills | Status: DC

## 2018-07-14 NOTE — Progress Notes (Signed)
Patient was scheduled for an initial integrated Care consult.    Presenting Issue:  Multiple stressors with the overall theme of she had a plan for her life and it has been completely disrupted.  Main issues include:  Relationship:  Worked with a guy.  Friends for a time.  Ended up "dating."  Accidentally got pregnant.  Also recently found out she got HSV2 from him.  Has tried to get him to engage financially, physically, and emotionally as a father but has discovered that he has "lied and cheated" and has also used "reverse psychology" on her to make her feel like she is the problem.    Single mom:  Daughter is 3718 months old.    Work:  Doesn't like a Educational psychologistco-worker.  Work is very stressful.  Also hard being a single mom while working.    Finances:  Tight.  Never expected to need financial help from her mom.    Biological father:  Was developing a relationship with him after most of her life, growing in her level of trust and he had a stroke.  This is heartbreaking to her.    Report of symptoms:  Self-report measures indicate significant symptoms of anxiety and depression.  Verbalized symptoms include "anxiety through the roof."  Also notes she hears voices and sees shadows and spirits.  The latter is considered culturally normal (per her reports).    Duration of CURRENT symptoms:  Worse in last two years (after she found out she was pregnant).  Age of onset of first mood disturbance:  Unknown.  Sounds like she might have had some early childhood adverse experiences.    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  Sounds like she has some difficulty at work with relationships.  Able to take care of her child (per report).  Does not sound like she has significant challenges with emotional control.    Psychiatric History - Diagnoses: Reports GAD.  Mood Disorder is on the problem list.   - Hospitalizations: Denies. - Pharmacotherapy: Per Med review, Elavil  (sleep?).  She reported taking a medicine for anxiety but I did not see one on her historical med list.   - Outpatient therapy: Denies.  Family history of psychiatric issues:  Maternal GREAT grandmother was hospitalized for mental health issues.  Dad's family unknown.    Current and history of substance use:  Denied current use of alcohol, drugs, cigarettes.  History of alcohol overuse and marijuana use.    Medical conditions that might explain or contribute to symptoms:  Chart reviewed.    PHQ-9:  21  GAD-7:  17 MDQ (if indicated):  Yes to first 7 symptoms and also #10.  Reports that these symptoms have occurred during the same period of time and caused a moderate problem.       Warm Hand Off Completed.

## 2018-07-14 NOTE — Assessment & Plan Note (Signed)
Patient is neatly groomed and appropriately dressed.  She asks her mom to attend the appointment with us.  She maintains reasonable eye contact.  Speech is normal in tone and rhythm with an accelerated rate.  Mood is anxious and expansive with a labile affect.  Thought process is logical.  Reports suicidal and homicidal ideation but no plan.  Reports hearing voices in her head that are different from her voice and her thoughts.  Also reports seeing things like shadows and spirits.  Mom reports she does as well.  Able to maintain train of thought.  Judgment and insight are average to slightly below.  Patient with evident expansiveness, pressured speech, and positive MDQ.  I have concerns for a Bipolar Spectrum Disorder.  Mom and patient denied a family history.  Given severity of symptoms, medication may be helpful but would need to get a firmer hold on diagnosis prior to initiating.    She likely needs more intensive therapy than what [facility] can provide however transportation, scheduling, access to care [facility] said they could see her next March, per her report) are issues.    Brief strategies this visit included safety planning which included these self-identified coping skills: 1. Listening to music and singing  2. Spending focused time with her daughter  Also discussed the following: 1. Journaling (used to write has been thinking about picking it up again) 2. Reaching out for social support (mom is identified as a safe person) 3. Thought stopping and correcting with :  "It will not always feel this bad." and "I can do hard things."  Finally, tried to do some preliminary work around the relationship as this is a source of great stress and misdirected energy.    Will call by the end of the week with ideas about next steps.  Because she voiced suicidal ideation, see assessment below:  Suicide Assessment  Plan: - How specific is the plan: No plan. - How lethal are the means: No  plan. - Does the patient have access to the means: No plan.   - Does the patient have social support: Yes.  Mom, brother and a few others.    Protective factors (what has kept the patient from self-harm thus far):  Mom is a big support and encourages her.    Substance use / abuse:  Denied current use of.  Presence of hallucinations / delusions: Reports demand hallucinations previously.  History of SI / Attempts: Denied but seemed ambivalent about this (hinted at taking pills previously but was not willing to discuss details).    Family history of attempted or completed suicide: Denied.  Duration and Intensity of SI:  Since she found out she was pregnant.  Can get fairly intense.  Has coping.    History of prior psychiatric hospitalizations: Denied.    Chart review for additional risk factors (cite chronic pain, insomnia, panic attacks, age, gender, if present): Recent STD diagnosis was very hard for her to hear.   Coping mechanisms: Singing, listening to music, other forms of distraction.    Discussed hospitalization and whether this would be appropriate.  Mom, patient, and I all agree that she is not a good candidate for hospitalization presently.  Provided resources and patient voiced confidence in her plan to keep herself safe.  See patient instructions for further plan.

## 2018-07-14 NOTE — Patient Instructions (Signed)
It was great to see you!  Our plans for today:  - We will provide you with stool card to provide a stool specimen to check for blood. When you come back, we'll also do a pregnancy test to make sure you are not pregnant. We will call you with these results. - You can use ibuprofen for abdominal cramping. - If you continue to have pain with negative results, we will likely need to do a pelvic ultrasound at that time. - I refilled your protonix and iron pills. - I sent in generic acyclovir for your outbreak until the prior authorization can be done.  Take care and seek immediate care sooner if you develop any concerns.   Dr. Mollie Germanyumball [facility]

## 2018-07-14 NOTE — Patient Instructions (Addendum)
I will call you with some ideas of next best steps.  I think you need more intensive therapy than what [facility] can provide but I understand there are barriers to you getting help elsewhere.  Let me think on it and I will call you by the end of this week.  When you're going through tough times, it's easy to feel lonely and overwhelmed. Remember that YOU ARE NOT ALONE and we at [facility] want to support you during this difficult time.  There are many ways to get help and support when you are ready.  You can call the following help lines: - National suicide hotline (985-796-01941-(984) 648-7710) - National Hopeline Network (1-800-SUICIDE)   You can schedule an appointment with a team member at [facility] - your primary care doc or one of our counselors.  We are all here to support you. A doctor is on call 24/7, so call us if you need to [phone]).   There are many treatments that can help people during difficult times, and we can talk with you about medications, counseling, diet, and other options. We want to offer you HOPE that it won't always feel this bad.   If you are seriously thinking about hurting yourself or have a plan, please call 911 or go to any emergency room right away for immediate help.

## 2018-07-14 NOTE — Assessment & Plan Note (Addendum)
Given history of mood disorder and recent stress over new STI diagnosis, could be IBS. Blood present on toilet paper could also be irritation of current HSV outbreak with strain from constipation. Will obtain FOBT. Suspect nausea is related to GERD, will refill Protonix. As she has not been sexually active since last UPreg and has not missed a period, will defer UPreg at this time. Encouraged use of Miralax for constipation. If patient still experiencing symptoms despite OTC pain relief and negative FOBT, wiill likely need pelvic ultrasound to rule out structural pathology.

## 2018-07-17 ENCOUNTER — Encounter (INDEPENDENT_AMBULATORY_CARE_PROVIDER_SITE_OTHER): Payer: Self-pay

## 2018-07-17 ENCOUNTER — Telehealth: Payer: Self-pay | Admitting: Psychology

## 2018-07-17 NOTE — Telephone Encounter (Signed)
Called patient to discuss therapy resources.  I left a VM asking for a call back.  Given acuity and what I think may be a trauma history, more intensive services than what [facility] offers would likely be best.    I thought Shelby Graves, MSW, LCSW might be a good match.  [facility].  She accepts Medicaid and has openings beginning the week of 8/5.  Will hopefully talk to her today and provide this resource.

## 2018-07-24 ENCOUNTER — Telehealth: Payer: Self-pay | Admitting: Psychology

## 2018-07-24 NOTE — Telephone Encounter (Signed)
Called patient to follow-up on MyChart message and see if she was able to get connected or had any questions or concerns.  Left a VM.

## 2018-08-18 ENCOUNTER — Ambulatory Visit: Payer: Medicaid Other | Admitting: Family Medicine

## 2018-08-18 ENCOUNTER — Encounter: Payer: Self-pay | Admitting: Family Medicine

## 2018-08-18 VITALS — Ht 65.0 in | Wt 186.0 lb

## 2018-08-18 DIAGNOSIS — M353 Polymyalgia rheumatica: Secondary | ICD-10-CM

## 2018-08-18 NOTE — Patient Instructions (Signed)
We will go ahead with bloodwork - get this done today. I will contact you with the results and next steps. Follow up and next steps will depend on the bloodwork results.

## 2018-08-19 LAB — MAGNESIUM: Magnesium: 1.9 mg/dL (ref 1.6–2.3)

## 2018-08-19 LAB — COMPREHENSIVE METABOLIC PANEL
A/G RATIO: 1.4 (ref 1.2–2.2)
ALK PHOS: 45 IU/L (ref 39–117)
ALT: 8 IU/L (ref 0–32)
AST: 14 IU/L (ref 0–40)
Albumin: 4.2 g/dL (ref 3.5–5.5)
BUN / CREAT RATIO: 13 (ref 9–23)
BUN: 9 mg/dL (ref 6–20)
Bilirubin Total: 0.2 mg/dL (ref 0.0–1.2)
CO2: 24 mmol/L (ref 20–29)
Calcium: 8.8 mg/dL (ref 8.7–10.2)
Chloride: 102 mmol/L (ref 96–106)
Creatinine, Ser: 0.71 mg/dL (ref 0.57–1.00)
GFR calc Af Amer: 137 mL/min/{1.73_m2} (ref >59)
GFR, EST NON AFRICAN AMERICAN: 119 mL/min/{1.73_m2} (ref >59)
GLOBULIN, TOTAL: 3.1 g/dL (ref 1.5–4.5)
Glucose: 102 mg/dL — ABNORMAL HIGH (ref 65–99)
POTASSIUM: 3.5 mmol/L (ref 3.5–5.2)
SODIUM: 137 mmol/L (ref 134–144)
Total Protein: 7.3 g/dL (ref 6.0–8.5)

## 2018-08-19 LAB — RHEUMATOID FACTOR: Rhuematoid fact SerPl-aCnc: <10 IU/mL (ref 0.0–13.9)

## 2018-08-19 LAB — SEDIMENTATION RATE: SED RATE: 14 mm/h (ref 0–32)

## 2018-08-19 LAB — TSH: TSH: 1.58 u[IU]/mL (ref 0.450–4.500)

## 2018-08-19 LAB — CK TOTAL AND CKMB (NOT AT ARMC): Total CK: 107 U/L (ref 24–173)

## 2018-08-19 LAB — ANA: ANA: NEGATIVE

## 2018-08-23 ENCOUNTER — Encounter: Payer: Self-pay | Admitting: Family Medicine

## 2018-08-23 NOTE — Progress Notes (Signed)
PCP: Rory Percy, DO  Subjective:   HPI: Patient is a 27 y.o. female here for multiple issues.  Patient is reporting at least 4-6 months of pain in her low back bilaterally that radiates down both legs. Also with bilateral posterior shoulder pain for 4-22month at 7/10 level. Associated tingling, numbness down both arms. States difficulty extending her elbows. Both shoulders feel weak. Has 6/10 level of pain in low back. Reports at times has had swelling, redness of her wrists and knees. Pain goes throughout body. No bowel/bladder dysfunction.  Past Medical History:  Diagnosis Date  . Anemia 2012  . Anxiety   . Arthritis   . Asthma 2009   last used inhaler 2009  . Depression   . H/O varicella   . 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 History of gonorrhea   . Hx of chlamydia infection   . Hyperventilation   . Migraine 2010  . PID (pelvic inflammatory disease) 2012   GC and chlamydia     Current Outpatient Medications on File Prior to Visit  Medication Sig Dispense Refill  . acyclovir ointment (ZOVIRAX) 5 % Apply 1 application topically every 3 (three) hours as needed. 30 g 0  . albuterol (PROVENTIL HFA;VENTOLIN HFA) 108 (90 Base) MCG/ACT inhaler Inhale 1-2 puffs into the lungs every 6 (six) hours as needed for wheezing or shortness of breath. 1 Inhaler 0  . diclofenac sodium (VOLTAREN) 1 % GEL Apply 2 g topically 4 (four) times daily. 3 Tube 1  . ferrous sulfate 325 (65 FE) MG tablet Take 1 tablet (325 mg total) by mouth 2 (two) times daily with a meal. 60 tablet 3  . fluticasone (FLONASE) 50 MCG/ACT nasal spray Place 2 sprays into both nostrils daily. 16 g 0  . gabapentin (NEURONTIN) 100 MG capsule Take 1 capsule (100 mg total) by mouth at bedtime. Can go up to 306mat bedtime 30 capsule 0  . pantoprazole (PROTONIX) 40 MG tablet Take 1 tablet (40 mg total) by mouth daily. 30 tablet 6  . Prenat-FeCbn-FeAspGl-FA-Omega (OB COMPLETE PETITE) 35-5-1-200 MG CAPS Take 1  capsule by mouth daily.    . Marland KitchenOVIRAX 5 % Apply 1 application topically every 3 (three) hours. 30 g 3   No current facility-administered medications on file prior to visit.     History reviewed. No pertinent surgical history.  Allergies  Allergen Reactions  . Latex Hives    Social History   Socioeconomic History  . Marital status: Single    Spouse name: Not on file  . Number of children: Not on file  . Years of education: 1323. Highest education level: Not on file  Occupational History  . Occupation: [employer]. Occupation: grTeacher, music Social Needs  . Financial resource strain: Not on file  . Food insecurity:    Worry: Not on file    Inability: Not on file  . Transportation needs:    Medical: Not on file    Non-medical: Not on file  Tobacco Use  . Smoking status: Former Smoker    Start date: 09/15/2012  . Smokeless tobacco: Never Used  . Tobacco comment: quit yrs ago  Substance and Sexual Activity  . Alcohol use: Yes    Comment: occ  . Drug use: No  . Sexual activity: Not on file  Lifestyle  . Physical activity:    Days per week: Not on  file    Minutes per session: Not on file  . Stress: Not on file  Relationships  . Social connections:    Talks on phone: Not on file    Gets together: Not on file    Attends religious service: Not on file    Active member of club or organization: Not on file    Attends meetings of clubs or organizations: Not on file    Relationship status: Not on file  . Intimate partner violence:    Fear of current or ex partner: Not on file    Emotionally abused: Not on file    Physically abused: Not on file    Forced sexual activity: Not on file  Other Topics Concern  . Not on file  Social History Narrative   Lives, 84 month old dgtr    Own apartment.    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 Hyperlipidemia Mother   .  Hypertension Mother   . Kidney disease Mother   . Hypertension Father   . Hypertension Brother   . Hyperlipidemia Brother   . Diabetes Maternal Grandmother   . Diabetes Maternal Grandfather   . Cancer Maternal Aunt 58       Ovarian CA    Ht 5' 5"  (1.651 m)   Wt 186 lb (84.4 kg)   BMI 30.95 kg/m   Review of Systems: See HPI above.     Objective:  Physical Exam:  Gen: NAD, comfortable in exam room  Back: No gross deformity, scoliosis. TTP throughout paraspinal regions.  No midline or bony TTP. FROM. Strength LEs 5/5 all muscle groups.   2+ MSRs in patellar and achilles tendons, equal bilaterally. Negative SLRs. Sensation intact to light touch bilaterally.  Bilateral hips: No deformity. FROM with 5/5 strength. No tenderness to palpation. NVI distally. Negative logroll bilateral hips Negative fabers and piriformis stretches.  Neck: No gross deformity, swelling, bruising. TTP cervical paraspinal region.  No midline/bony TTP. FROM. BUE strength 5/5.   Sensation intact to light touch.   2+ equal reflexes in triceps, biceps, brachioradialis tendons. Negative spurlings. NV intact distal BUEs.  Bilateral shoulders: No swelling, ecchymoses.  No gross deformity. TTP anterior, posterior shoulders. FROM. Negative Hawkins, Neers. Negative Yergasons. Strength 5/5 with empty can and resisted internal/external rotation. Negative apprehension. NV intact distally.  Did not appreciate any effusion/erythema of joints of lower and upper extremities.  Assessment & Plan:  1. Myalgias and arthralgias - her pain is not consistent with an orthopedic cause of her pain.  Advised given diffuse nature of this would go ahead with some labwork including CMP, ANA, RF, ESR, TSH, CK, and Mg.  If any abnormalities would consider rheumatology referral - no evidence synovitis in joints today.  If these appear normal would consider neurology referral.  We discussed possibility of fibromyalgia  as well.

## 2018-08-25 NOTE — Addendum Note (Signed)
Addended by: Kathi Simpers F on: 08/25/2018 12:20 PM   Modules accepted: Orders

## 2018-10-21 ENCOUNTER — Encounter: Payer: Self-pay | Admitting: Diagnostic Neuroimaging

## 2018-10-21 ENCOUNTER — Encounter

## 2018-10-21 ENCOUNTER — Ambulatory Visit: Payer: 59 | Admitting: Diagnostic Neuroimaging

## 2018-10-21 ENCOUNTER — Telehealth: Payer: Self-pay | Admitting: Diagnostic Neuroimaging

## 2018-10-21 VITALS — BP 113/76 | HR 87 | Ht 65.0 in | Wt 186.0 lb

## 2018-10-21 DIAGNOSIS — G894 Chronic pain syndrome: Secondary | ICD-10-CM

## 2018-10-21 DIAGNOSIS — R200 Anesthesia of skin: Secondary | ICD-10-CM

## 2018-10-21 NOTE — Telephone Encounter (Signed)
Spoke to patient she is aware of this. I gave her GI phone number of [phone] to give them a call if she has not heard in the next 2-3 business days.

## 2018-10-21 NOTE — Telephone Encounter (Signed)
[employer] order sent to [facility]. They obtain the auth and will reach out to the patient to schedule.

## 2018-10-21 NOTE — Progress Notes (Signed)
[facility]  PATIENT: Shelby Graves DOB: 1991/11/24  REFERRING CLINICIAN: S Hudnall HISTORY FROM: patient  REASON FOR VISIT: follow up   HISTORICAL  CHIEF COMPLAINT:  Chief Complaint  Patient presents with  . Polymyalgia    rm 6, New referral/new problem, " pain throughout body, shocking and tingling w/numbness. Can't lift arm above head for short period of time; very sensitive to touch, it hurts when someone touches me; bad back pain along with sciatica pain; light sensitivity, bright lights cause headahces, migraines"     HISTORY OF PRESENT ILLNESS:   UPDATE (10/21/18, VRP): Since last visit, was doing better through Feb-Mar 2019; started work and exercising and was doing well. Then had setback of symptoms in last 1-2 months. More pain, insomnia, numbness now.   PRIOR HPI (12/10/17, VRP): 27 year old female here for evaluation of bilateral wrist pain.  Since January 2018, following delivery of her baby daughter, patient had onset of intermittent stiffness, pain, numbness and tingling in her wrists and hands.  Symptoms worse with particular positions of her hands and wrists in particular activities.  Over time symptoms have gradually worsened.  Combing her hair, brushing her teeth, holding objects, using a keyboard, holding her daughter, seem to trigger and aggravate her symptoms.  Sometimes she wakes up from sleep with the symptoms.  Patient had EMG nerve conduction study in August 2018 which was normal.  Due to persistence of symptoms patient referred here for further evaluation of other possible neurologic etiologies.  Of note patient has constellation of other symptoms including insomnia, restlessness, headache, dizziness, depression, anxiety, racing thoughts, joint pain, joint swelling, aching muscles, constipation, shortness of breath, anemia, blood in stool, birthmarks fatigue and weight gain.  REVIEW OF SYSTEMS: Full 14 system review of systems performed  and negative with exception of: as per HPI.  ALLERGIES: Allergies  Allergen Reactions  . Latex Hives    HOME MEDICATIONS: Outpatient Medications Prior to Visit  Medication Sig Dispense Refill  . ferrous sulfate 325 (65 FE) MG tablet Take 1 tablet (325 mg total) by mouth 2 (two) times daily with a meal. 60 tablet 3  . acyclovir ointment (ZOVIRAX) 5 % Apply 1 application topically every 3 (three) hours as needed. (Patient not taking: Reported on 10/21/2018) 30 g 0  . albuterol (PROVENTIL HFA;VENTOLIN HFA) 108 (90 Base) MCG/ACT inhaler Inhale 1-2 puffs into the lungs every 6 (six) hours as needed for wheezing or shortness of breath. (Patient not taking: Reported on 10/21/2018) 1 Inhaler 0  . diclofenac sodium (VOLTAREN) 1 % GEL Apply 2 g topically 4 (four) times daily. (Patient not taking: Reported on 10/21/2018) 3 Tube 1  . fluticasone (FLONASE) 50 MCG/ACT nasal spray Place 2 sprays into both nostrils daily. (Patient not taking: Reported on 10/21/2018) 16 g 0  . gabapentin (NEURONTIN) 100 MG capsule Take 1 capsule (100 mg total) by mouth at bedtime. Can go up to 300mg  at bedtime (Patient not taking: Reported on 10/21/2018) 30 capsule 0  . pantoprazole (PROTONIX) 40 MG tablet Take 1 tablet (40 mg total) by mouth daily. (Patient not taking: Reported on 10/21/2018) 30 tablet 6  . Prenat-FeCbn-FeAspGl-FA-Omega (OB COMPLETE PETITE) 35-5-1-200 MG CAPS Take 1 capsule by mouth daily.    Marland Kitchen ZOVIRAX 5 % Apply 1 application topically every 3 (three) hours. (Patient not taking: Reported on 10/21/2018) 30 g 3   No facility-administered medications prior to visit.     PAST MEDICAL HISTORY: Past Medical History:  Diagnosis Date  . Anemia  2012  . Anxiety   . Arthritis   . Asthma 2009   last used inhaler 2009  . Depression   . H/O varicella   . 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 History of gonorrhea   . Hx of chlamydia infection   . Hyperventilation   . Migraine 2010  . PID (pelvic  inflammatory disease) 2012   GC and chlamydia     PAST SURGICAL HISTORY: No past surgical history on file.  FAMILY HISTORY: Family History  Problem Relation Age of Onset  . Hyperlipidemia Mother   . Hypertension Mother   . Kidney disease Mother   . Hypertension Father   . Hypertension Brother   . Hyperlipidemia Brother   . Diabetes Maternal Grandmother   . Diabetes Maternal Grandfather   . Cancer Maternal Aunt 76       Ovarian CA    SOCIAL HISTORY:  Social History   Socioeconomic History  . Marital status: Single    Spouse name: Not on file  . Number of children: 1  . Years of education: 1  . Highest education level: Bachelor's degree (e.g., BA, AB, BS)  Occupational History    Comment: [employer]  Social Needs  . Financial resource strain: Not on file  . Food insecurity:    Worry: Not on file    Inability: Not on file  . Transportation needs:    Medical: Not on file    Non-medical: Not on file  Tobacco Use  . Smoking status: Former Smoker    Start date: 09/15/2012    Last attempt to quit: 10/22/2015    Years since quitting: 3.0  . Smokeless tobacco: Never Used  . Tobacco comment: quit yrs ago  Substance and Sexual Activity  . Alcohol use: Yes    Comment: occ  . Drug use: No  . Sexual activity: Not on file  Lifestyle  . Physical activity:    Days per week: Not on file    Minutes per session: Not on file  . Stress: Not on file  Relationships  . Social connections:    Talks on phone: Not on file    Gets together: Not on file    Attends religious service: Not on file    Active member of club or organization: Not on file    Attends meetings of clubs or organizations: Not on file    Relationship status: Not on file  . Intimate partner violence:    Fear of current or ex partner: Not on file    Emotionally abused: Not on file    Physically abused: Not on file    Forced sexual activity: Not on file  Other Topics Concern  . Not on file    Social History Narrative   Lives with family, 2 old dgtr 09/2018    Own apartment.    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 No exam data present  Patient is in no distress; well developed, nourished and groomed; neck is supple  IN DARK ROOM; SLIGHTLY UNCOMFORTABLE APPEARING  CARDIOVASCULAR:  Examination of carotid arteries is normal; no carotid bruits  Regular rate and rhythm, no murmurs  Examination of peripheral vascular system by observation and palpation is normal  EYES:  Ophthalmoscopic exam of optic discs and posterior segments is normal; no papilledema or hemorrhages  MUSCULOSKELETAL:  Gait, strength, tone, movements noted in Neurologic exam below  NEUROLOGIC: MENTAL STATUS:  No flowsheet data found.  awake, alert, oriented to person, place and time  recent and remote memory intact  normal attention and concentration  language fluent, comprehension intact, naming intact,   fund of knowledge appropriate  CRANIAL NERVE:   2nd - no papilledema on fundoscopic exam  2nd, 3rd, 4th, 6th - pupils equal and reactive to light, visual fields full to confrontation, extraocular muscles intact, no nystagmus  5th - facial sensation symmetric  7th - facial strength symmetric  8th - hearing intact  9th - palate elevates symmetrically, uvula midline  11th - shoulder shrug symmetric  12th - tongue protrusion midline  MOTOR:   normal bulk and tone, full strength in the BUE, BLE  SENSORY:    normal and symmetric to light touch, temperature, vibration  COORDINATION:   finger-nose-finger, fine finger movements normal  REFLEXES:   deep tendon reflexes present and symmetric  GAIT/STATION:   narrow based gait    DIAGNOSTIC DATA (LABS, IMAGING, TESTING) - I reviewed patient records, labs, notes, testing and imaging myself where available.  Lab Results  Component Value Date   WBC 8.0 01/05/2017   HGB 11.8 (L) 01/05/2017   HCT 32.3 (L) 01/05/2017   MCV 80.3 01/05/2017   PLT 204 01/05/2017      Component Value Date/Time   NA 137 08/18/2018 1640   K 3.5 08/18/2018 1640   CL 102 08/18/2018 1640   CO2 24 08/18/2018 1640   GLUCOSE 102 (H) 08/18/2018 1640   GLUCOSE 72 07/16/2012 1447   BUN 9 08/18/2018 1640   CREATININE 0.71 08/18/2018 1640   CREATININE 0.78 07/16/2012 1447   CALCIUM 8.8 08/18/2018 1640   PROT 7.3 08/18/2018 1640   ALBUMIN 4.2 08/18/2018 1640   AST 14 08/18/2018 1640   ALT 8 08/18/2018 1640   ALKPHOS 45 08/18/2018 1640   BILITOT 0.2 08/18/2018 1640   GFRNONAA 119 08/18/2018 1640   GFRAA 137 08/18/2018 1640   No results found for: CHOL, HDL, LDLCALC, LDLDIRECT, TRIG, CHOLHDL No results found for: ZOXW9U No results found for: VITAMINB12 Lab Results  Component Value Date   TSH 1.580 08/18/2018     07/31/17 EMG/NCS NERVE CONDUCTION STUDY: Bilateral median and ulnar motor responses are normal.  Left peroneal and left tibial motor responses are normal. Bilateral ulnar and left tibial F wave latencies are normal. Bilateral radial, bilateral median, bilateral ulnar, left sural and left superficial peroneal sensory responses are normal.  NEEDLE ELECTROMYOGRAPHY: Needle examination of left lower extremity vastus medialis, tibialis anterior, gastrocnemius and left lumbar paraspinal muscles are normal.  IMPRESSION:  This is a normal study. No electrodiagnostic evidence of large fiber neuropathy or left lumbar radiculopathy at this  time.       ASSESSMENT AND PLAN  27 y.o. year old female here with new onset of numbness, tingling, pain, stiffness in her wrists and fingers since January 2018.  Neurologic examination and EMG are unremarkable.  Also  with other constellation of symptoms including pain, numbness, burning sensation, insomnia, stress.  Dx:  1. Numbness   2. Pain syndrome, chronic      PLAN:  - check MRI brain (w/wo) and MRI cervical spine (w/wo) - rule out demyelinating dz - optimize treatments of insomnia, depression, anxiety; follow up with PCP and psychology  Orders Placed This Encounter  Procedures  . MR BRAIN W WO CONTRAST  . MR CERVICAL SPINE W WO CONTRAST   Return if symptoms worsen or fail to improve.    Suanne Marker, MD 10/21/2018, 10:55 AM Certified in Neurology, Neurophysiology and Neuroimaging  [facility] [address], [address] [place], [place] [address] [phone]

## 2018-11-01 ENCOUNTER — Other Ambulatory Visit: Payer: Self-pay

## 2018-11-03 NOTE — Telephone Encounter (Signed)
Drucie OpitzAetna Auth: [id number]: [id number] (exp. 11/02/18 to 01/31/19) patient is scheduled at GI for 11/08/18.

## 2018-11-05 ENCOUNTER — Emergency Department (HOSPITAL_COMMUNITY)
Admission: EM | Admit: 2018-11-05 | Discharge: 2018-11-05 | Disposition: A | Discharge status: Home / Self Care | Payer: 59 | Attending: Emergency Medicine | Admitting: Emergency Medicine

## 2018-11-05 ENCOUNTER — Emergency Department (HOSPITAL_COMMUNITY): Payer: 59

## 2018-11-05 ENCOUNTER — Encounter (HOSPITAL_COMMUNITY): Payer: Self-pay | Admitting: Emergency Medicine

## 2018-11-05 DIAGNOSIS — S161XXA Strain of muscle, fascia and tendon at neck level, initial encounter: Secondary | ICD-10-CM | POA: Diagnosis not present

## 2018-11-05 DIAGNOSIS — Y9389 Activity, other specified: Secondary | ICD-10-CM | POA: Insufficient documentation

## 2018-11-05 DIAGNOSIS — F329 Major depressive disorder, single episode, unspecified: Secondary | ICD-10-CM | POA: Insufficient documentation

## 2018-11-05 DIAGNOSIS — Z87891 Personal history of nicotine dependence: Secondary | ICD-10-CM | POA: Diagnosis not present

## 2018-11-05 DIAGNOSIS — F419 Anxiety disorder, unspecified: Secondary | ICD-10-CM | POA: Diagnosis not present

## 2018-11-05 DIAGNOSIS — Z79899 Other long term (current) drug therapy: Secondary | ICD-10-CM | POA: Diagnosis not present

## 2018-11-05 DIAGNOSIS — Z91040 Latex allergy status: Secondary | ICD-10-CM | POA: Insufficient documentation

## 2018-11-05 DIAGNOSIS — Y92410 Unspecified street and highway as the place of occurrence of the external cause: Secondary | ICD-10-CM | POA: Insufficient documentation

## 2018-11-05 DIAGNOSIS — J45909 Unspecified asthma, uncomplicated: Secondary | ICD-10-CM | POA: Diagnosis not present

## 2018-11-05 DIAGNOSIS — Y998 Other external cause status: Secondary | ICD-10-CM | POA: Diagnosis not present

## 2018-11-05 DIAGNOSIS — S199XXA Unspecified injury of neck, initial encounter: Secondary | ICD-10-CM | POA: Diagnosis present

## 2018-11-05 MED ORDER — METHOCARBAMOL 500 MG PO TABS: 500.0000 mg | ORAL_TABLET | Freq: Once | ORAL | Status: DC

## 2018-11-05 MED ORDER — METHOCARBAMOL 500 MG PO TABS: 1000.0000 mg | ORAL_TABLET | Freq: Four times a day (QID) | ORAL | 0 refills | Status: DC
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
## 2018-11-06 ENCOUNTER — Telehealth: Payer: Self-pay | Admitting: Family Medicine

## 2018-11-06 NOTE — Telephone Encounter (Signed)
Patient calling requesting to be referred to rheumatology. She is supposed to have an MRI done of her cervical spine and brain but is unable to do so at this time due to price

## 2018-11-08 ENCOUNTER — Other Ambulatory Visit: Payer: Self-pay

## 2018-11-08 NOTE — Telephone Encounter (Signed)
Ok to send in referral.  I'd advise her we discussed this and did a lot of bloodwork, all that bloodwork came back normal - I'd expect an abnormality if she had a rheumatologic disease.  It's possible with this being normal rheumatology states there's nothing they can offer her.

## 2018-11-09 NOTE — Addendum Note (Signed)
Addended by: Kathi SimpersWISE, Randi Poullard F on: 11/09/2018 08:54 AM   Modules accepted: Orders

## 2018-11-09 NOTE — Telephone Encounter (Signed)
Referral sent to Rheumatology

## 2018-11-26 ENCOUNTER — Ambulatory Visit: Payer: Self-pay | Admitting: Family Medicine

## 2018-12-14 ENCOUNTER — Ambulatory Visit: Payer: Self-pay | Admitting: Family Medicine

## 2018-12-14 ENCOUNTER — Ambulatory Visit: Payer: 59 | Admitting: Family Medicine

## 2018-12-18 NOTE — Progress Notes (Signed)
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 female seen in consultation per request of Dr. Barbaraann Barthel.  According to patient her symptoms started in 2015 while she was in the school.  States her entire spine would hurt.  She also started having migraine headaches at age 61.  She was frequently seen by her PCP and was given anti-inflammatories.  She states she had recurrent right tennis elbow.  She also recalls that a 19 she developed left knee joint dislocation and has chronic pain in her left knee joint.  She was frequently given Percocet for lower back pain and migraines.  She also had painful heavy menstrual cycles.  She states in 2017 she got pregnant and during the pregnancy she had lot of difficulty walking and nocturnal pain due to lower back pain.  She also had asthma flares.  Now she is 2 years postpartum and she continues to have increased pain and discomfort in her entire back.  She has been also experiencing increased pain in her bilateral wrist joints.  She was seen by Dr. Barbaraann Barthel who gave her cortisone injection to her wrist joints per patient.  She states that helped her minimally.  She still continues to have decreased grip strength.  She continues to have neck is stiffness migraine headaches and increased fatigue.  She also has insomnia due to nocturnal pain.  She complains of pain and discomfort in her bilateral shoulders and her bilateral feet.  She also has recurrent plantar fasciitis in her right foot.  Activities of Daily Living:  Patient reports morning stiffness for 3 hours.   Patient Reports nocturnal pain.  Difficulty dressing/grooming: Reports Difficulty climbing stairs:  Reports Difficulty getting out of chair: Denies Difficulty using hands for taps, buttons, cutlery, and/or writing: Reports  Review of Systems  Constitutional: Positive for fatigue. Negative for night sweats, weight gain and weight loss.  HENT: Positive for mouth dryness. Negative for mouth sores, trouble swallowing, trouble swallowing and nose dryness.   Eyes: Positive for dryness. Negative for pain, redness and visual disturbance.  Respiratory: Positive for shortness of breath. Negative for cough and difficulty breathing.        Asthma  Cardiovascular: Negative for chest pain, palpitations, hypertension, irregular heartbeat and swelling in legs/feet.  Gastrointestinal: Positive for constipation and diarrhea. Negative for blood in stool.  Endocrine: Positive for cold intolerance, heat intolerance, excessive thirst, excessive hunger and increased urination.  Genitourinary: Negative for difficulty urinating and vaginal dryness.  Musculoskeletal: Positive for arthralgias, joint pain, muscle weakness, morning stiffness and muscle tenderness. Negative for gait problem, joint swelling, myalgias and myalgias.  Skin: Negative for color change, rash, hair loss, skin tightness, ulcers and sensitivity to sunlight.  Allergic/Immunologic: Negative for susceptible to infections.  Neurological: Positive for dizziness, numbness and weakness. Negative for memory loss and night sweats.  Hematological: Positive for bruising/bleeding tendency. Negative for swollen glands.  Psychiatric/Behavioral: Positive for sleep disturbance. Negative for depressed mood. The patient is not nervous/anxious.     PMFS History:  Patient Active Problem List   Diagnosis Date  Noted  . Lower abdominal pain 07/14/2018  . 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 DUB (dysfunctional uterine bleeding) 12/17/2017  . Healthcare maintenance 06/03/2017  . Axillary mass, left 01/08/2017  . Hemoglobin C trait ([place]) 07/11/2016  . Mood  disorder ([place]) 12/02/2012  . IDA (iron deficiency anemia) 07/16/2012  . Vitamin D deficiency 07/16/2012    Past Medical History:  Diagnosis Date  . Anemia 2012  . Anxiety   . Arthritis   . Asthma 2009   last used inhaler 2009  . Depression   . H/O varicella   . 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 History of gonorrhea   . Hx of chlamydia infection   . Hyperventilation   . Migraine 2010  . PID (pelvic inflammatory disease) 2012   GC and chlamydia     Family History  Problem Relation Age of Onset  . Hyperlipidemia Mother   . Hypertension Mother   . Kidney disease Mother   . Hypertension Father   . Hypertension Brother   . Hyperlipidemia Brother   . Diabetes Maternal Grandmother   . Diabetes Maternal Grandfather   . Cancer Maternal Aunt 72       Ovarian CA   History reviewed. No pertinent surgical history. Social History   Social History Narrative   Lives with family, 2 old dgtr 09/2018    Own apartment.    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  Constitutional:      Appearance: She is well-developed.  HENT:     Head: Normocephalic and atraumatic.  Eyes:     Conjunctiva/sclera: Conjunctivae normal.  Neck:     Musculoskeletal: Normal range of motion.  Cardiovascular:     Rate and Rhythm: Normal rate and regular rhythm.     Heart sounds: Normal heart sounds.  Pulmonary:     Effort: Pulmonary effort is normal.     Breath sounds: Normal breath sounds.  Abdominal:     General: Bowel sounds are normal.     Palpations: Abdomen is soft.  Lymphadenopathy:     Cervical: No cervical adenopathy.  Skin:    General: Skin is warm and dry.     Capillary Refill: Capillary refill takes less  than 2 seconds.  Neurological:     Mental Status: She is alert and oriented to person, place, and time.  Psychiatric:        Behavior: Behavior normal.      Musculoskeletal Exam: C-spine thoracic lumbar spine good range of motion.  She had discomfort range of motion of cervical thoracic and lumbar spine.  She had tenderness over bilateral SI joints.  Shoulder joints elbow joints wrist joint MCPs PIPs DIPs with good range of motion with no synovitis.  Hip joints knee joints ankles MTPs PIPs were in good range of motion with no synovitis.  She has tenderness on palpation over almost all of her joints and muscle groups.  She had multiple tender points and hyperalgesia.  She had tenderness over bilateral trapezius area bilateral epicondyle area and bilateral trochanteric area.  CDAI Exam: CDAI Score: Not documented Patient Global Assessment: Not documented;  Provider Global Assessment: Not documented Swollen: Not documented; Tender: Not documented Joint Exam   Not documented   There is currently no information documented on the homunculus. Go to the Rheumatology activity and complete the homunculus joint exam.  Investigation: Findings:  08/18/18: CK 107, ANA negative, sed rate 14, RF<10, TSH 1.580, magnesium 1.9  Component     Latest Ref Rng & Units 08/18/2018  CK Total     24 - 173 U/L 107  CK-MB Index     0.0 - 5.3 ng/mL <1.0  Anti Nuclear Antibody(ANA)     Negative Negative  Sed Rate     0 - 32 mm/hr 14  RA Latex Turbid.     0.0 - 13.9 IU/mL <10.0  TSH     0.450 - 4.500 uIU/mL 1.580  Magnesium     1.6 - 2.3 mg/dL 1.9   Imaging: Xr Foot 2 Views Left  Result Date: 12/29/2018 No MTP PIP/DIP or intertarsal joint space narrowing was noted.  No subtalar joint space narrowing was noted.  No erosive changes were noted. Impression: Unremarkable x-ray of the foot.  Xr Foot 2 Views Right  Result Date: 12/29/2018 No MTP PIP/DIP or intertarsal joint space narrowing was noted.  No  subtalar joint space narrowing was noted.  No erosive changes were noted. Impression: Unremarkable x-ray of the foot.  Xr Hand 2 View Left  Result Date: 12/29/2018 No MCP, PIP, DIP, intercarpal, radiocarpal joint space narrowing was noted.  No juxta-articular osteopenia was noted.  No erosive changes were noted. Impression: Unremarkable x-ray of the hand.  Xr Hand 2 View Right  Result Date: 12/29/2018 No MCP, PIP, DIP, intercarpal, radiocarpal joint space narrowing was noted.  No juxta-articular osteopenia was noted.  No erosive changes were noted. Impression: Unremarkable x-ray of the hand.  Xr Lumbar Spine 2-3 Views  Result Date: 12/29/2018 No disc space narrowing was noted.  No facet joint arthropathy was noted.  No syndesmophytes were noted. Impression: Unremarkable x-ray of the lumbar spine.  Xr Pelvis 1-2 Views  Result Date: 12/29/2018 No SI joint narrowing or sclerosis was noted. Impression: Unremarkable x-ray of the SI joints.   Recent Labs: Lab Results  Component Value Date   WBC 8.0 01/05/2017   HGB 11.8 (L) 01/05/2017   PLT 204 01/05/2017   NA 137 08/18/2018   K 3.5 08/18/2018   CL 102 08/18/2018   CO2 24 08/18/2018   GLUCOSE 102 (H) 08/18/2018   BUN 9 08/18/2018   CREATININE 0.71 08/18/2018   BILITOT 0.2 08/18/2018   ALKPHOS 45 08/18/2018   AST 14 08/18/2018   ALT 8 08/18/2018   PROT 7.3 08/18/2018   ALBUMIN 4.2 08/18/2018   CALCIUM 8.8 08/18/2018   GFRAA 137 08/18/2018    Speciality Comments: No specialty comments available.  Procedures:  No procedures performed Allergies: Latex   Assessment / Plan:     Visit Diagnoses: Polymyalgia ([place]) -patient complains of pain in multiple muscle groups.  She has generalized hyperalgesia and positive tender points.  She was tearful during the examination on gentle palpation.  She has pain through a lot of stress in the last few years.  She also gives history of chronic insomnia as she has been raising her child is a single  parent.  She has been experiencing nocturnal pain.  Based on her examination and history I believe she may have fibromyalgia syndrome.  There is also history of fibromyalgia syndrome in her mother.  All her autoimmune work-up so  far has been negative.  I do not see any synovitis on muscle weakness on examination today.  Need for regular exercise was discussed.  Good sleep hygiene was discussed.  08/18/18: CK 107, ANA negative, sed rate 14, RF<10, TSH 1.580, magnesium 1.9 - Plan: Serum protein electrophoresis with reflex  Pain in both hands -she complains of severe pain in her bilateral hands.  No warmth swelling or effusion was noted.  She also gives history of decreased grip strength.  Plan: XR Hand 2 View Right, XR Hand 2 View Left, x-ray of bilateral hands were unremarkable.  Cyclic citrul peptide antibody, IgG  Chronic midline low back pain without sciatica -she has lower back and entire spine discomfort for the last several years.  She states she has taken anti-inflammatories and Percocet in the past.  She had good range of motion on examination.  Plan: XR Lumbar Spine 2-3 Views.  The lumbar spine x-ray was unremarkable.  Chronic SI joint pain -she had tenderness on palpation of bilateral SI joints.  Although she also had tenderness in the gluteal muscle and trochanteric area.  Plan: XR Pelvis 1-2 Views,.  The SI joint x-rays were within normal limits.  HLA-B27 antigen  Pain in both feet -she complains of pain in bilateral feet.  She also gives history of recurrent right foot plantar fasciitis.  Plan: XR Foot 2 Views Right, XR Foot 2 Views Left.  X-ray of bilateral feet were unremarkable.  Hx of migraines-she has history of chronic migraines which has been worse in the last 2 years per patient.  History of asthma-her asthma flares quite often.  Hemoglobin C trait (HCC)  Mood disorder (HCC)  Vitamin D deficiency - Plan: VITAMIN D 25 Hydroxy (Vit-D Deficiency, Fractures)  History of iron  deficiency anemia  Herpes simplex type 2 infection  Anxiety and depression -she is currently not on any antidepressant.  She may benefit from Cymbalta.  I have advised her to discuss that further with her PCP.  Orders: Orders Placed This Encounter  Procedures  . XR Hand 2 View Right  . XR Hand 2 View Left  . XR Pelvis 1-2 Views  . XR Foot 2 Views Right  . XR Foot 2 Views Left  . XR Lumbar Spine 2-3 Views  . Cyclic citrul peptide antibody, IgG  . HLA-B27 antigen  . VITAMIN D 25 Hydroxy (Vit-D Deficiency, Fractures)  . Serum protein electrophoresis with reflex   No orders of the defined types were placed in this encounter.   Face-to-face time spent with patient was 60 minutes. Greater than 50% of time was spent in counseling and coordination of care.  Follow-Up Instructions: Return if symptoms worsen or fail to improve, for Polyarthralgia and myalgia.  I will call her with the lab results.  If labs are abnormal we will schedule a follow-up appointment.   Bo Merino, MD  Note - This record has been created using Editor, commissioning.  Chart creation errors have been sought, but may not always  have been located. Such creation errors do not reflect on  the standard of medical care.

## 2018-12-29 ENCOUNTER — Ambulatory Visit (INDEPENDENT_AMBULATORY_CARE_PROVIDER_SITE_OTHER): Payer: Self-pay

## 2018-12-29 ENCOUNTER — Ambulatory Visit: Payer: 59 | Admitting: Rheumatology

## 2018-12-29 ENCOUNTER — Ambulatory Visit (INDEPENDENT_AMBULATORY_CARE_PROVIDER_SITE_OTHER): Payer: 59

## 2018-12-29 ENCOUNTER — Encounter: Payer: Self-pay | Admitting: Rheumatology

## 2018-12-29 VITALS — BP 112/75 | HR 87 | Resp 14 | Ht 65.25 in | Wt 187.0 lb

## 2018-12-29 DIAGNOSIS — M545 Low back pain: Secondary | ICD-10-CM | POA: Diagnosis not present

## 2018-12-29 DIAGNOSIS — M79641 Pain in right hand: Secondary | ICD-10-CM

## 2018-12-29 DIAGNOSIS — M533 Sacrococcygeal disorders, not elsewhere classified: Secondary | ICD-10-CM | POA: Diagnosis not present

## 2018-12-29 DIAGNOSIS — Z862 Personal history of diseases of the blood and blood-forming organs and certain disorders involving the immune mechanism: Secondary | ICD-10-CM

## 2018-12-29 DIAGNOSIS — Z8709 Personal history of other diseases of the respiratory system: Secondary | ICD-10-CM

## 2018-12-29 DIAGNOSIS — M353 Polymyalgia rheumatica: Secondary | ICD-10-CM

## 2018-12-29 DIAGNOSIS — M79642 Pain in left hand: Secondary | ICD-10-CM | POA: Diagnosis not present

## 2018-12-29 DIAGNOSIS — G8929 Other chronic pain: Secondary | ICD-10-CM

## 2018-12-29 DIAGNOSIS — M79671 Pain in right foot: Secondary | ICD-10-CM | POA: Diagnosis not present

## 2018-12-29 DIAGNOSIS — F39 Unspecified mood [affective] disorder: Secondary | ICD-10-CM

## 2018-12-29 DIAGNOSIS — E559 Vitamin D deficiency, unspecified: Secondary | ICD-10-CM

## 2018-12-29 DIAGNOSIS — B009 Herpesviral infection, unspecified: Secondary | ICD-10-CM

## 2018-12-29 DIAGNOSIS — M79672 Pain in left foot: Secondary | ICD-10-CM

## 2018-12-29 DIAGNOSIS — F419 Anxiety disorder, unspecified: Secondary | ICD-10-CM

## 2018-12-29 DIAGNOSIS — Z8669 Personal history of other diseases of the nervous system and sense organs: Secondary | ICD-10-CM

## 2018-12-29 DIAGNOSIS — F329 Major depressive disorder, single episode, unspecified: Secondary | ICD-10-CM

## 2018-12-29 DIAGNOSIS — D582 Other hemoglobinopathies: Secondary | ICD-10-CM

## 2018-12-30 ENCOUNTER — Telehealth: Payer: Self-pay | Admitting: *Deleted

## 2018-12-30 DIAGNOSIS — E559 Vitamin D deficiency, unspecified: Secondary | ICD-10-CM

## 2018-12-30 MED ORDER — VITAMIN D (ERGOCALCIFEROL) 1.25 MG (50000 UNIT) PO CAPS: 50000.0000 [IU] | ORAL_CAPSULE | ORAL | 0 refills | Status: AC

## 2018-12-30 NOTE — Telephone Encounter (Signed)
Prescription sent to the pharmacy and order placed for vitamin D to be rechecked.

## 2018-12-30 NOTE — Progress Notes (Signed)
Vitamin D deficiency.  Please call in vitamin D 50,000 units twice a week for 3 months.  Recheck vitamin D level in 3 months.  She will need maintenance vitamin D therapy in future.

## 2018-12-30 NOTE — Telephone Encounter (Signed)
-----   Message from Pollyann Savoy, MD sent at 12/30/2018 12:28 PM EST ----- Vitamin D deficiency.  Please call in vitamin D 50,000 units twice a week for 3 months.  Recheck vitamin D level in 3 months.  She will need maintenance vitamin D therapy in future.

## 2018-12-31 LAB — PROTEIN ELECTROPHORESIS, SERUM, WITH REFLEX
ALPHA 2: 0.7 g/dL (ref 0.5–0.9)
Albumin ELP: 3.8 g/dL (ref 3.8–4.8)
Alpha 1: 0.3 g/dL (ref 0.2–0.3)
Beta 2: 0.4 g/dL (ref 0.2–0.5)
Beta Globulin: 0.4 g/dL (ref 0.4–0.6)
Gamma Globulin: 1.4 g/dL (ref 0.8–1.7)
Total Protein: 7.1 g/dL (ref 6.1–8.1)

## 2018-12-31 LAB — CYCLIC CITRUL PEPTIDE ANTIBODY, IGG: Cyclic Citrullin Peptide Ab: <16 UNITS

## 2018-12-31 LAB — VITAMIN D 25 HYDROXY (VIT D DEFICIENCY, FRACTURES): Vit D, 25-Hydroxy: 19 ng/mL — ABNORMAL LOW (ref 30–100)

## 2018-12-31 LAB — HLA-B27 ANTIGEN: HLA-B27 Antigen: NEGATIVE

## 2019-02-15 DIAGNOSIS — S73001A Unspecified subluxation of right hip, initial encounter: Secondary | ICD-10-CM | POA: Insufficient documentation

## 2019-02-15 NOTE — Progress Notes (Signed)
Subjective:   Patient ID: Shelby Graves    DOB: 09-03-1991, 28 y.o. female   MRN: [id number]  Tammy Sayne is a 28 y.o. female with a history of mood d/o, DUB, Hb C trait, HSV2, IDA, vit D deficiency here for   Body Pain - Seen 07/2018 by Sports Med for 4-6 mth h/o b/l LBP and shoulder pain, not clearly MSK origin. Seen by Rheumatology 12/29/2018 felt to be d/t fibromyalgia. Autoimmune labs and hand/foot/spine xrays wnl.  - still having hip pain with no known exacerbating factors. - intermittent LBP, burning and radiating to middle back - also with difficulty sleeping at night. Daughter does sleep with her sometimes. - bilateral foot pain R>L - comes every month, states she has longstanding h/o R plantar fascitis. Larey Seat a few months back, received ankle brace but doesn't help. Has tried insoles. Hard to stand on feet. Wearing tennis shoes ever since she started having difficulty. Feels pain when driving.  - Has been doing some stretching.  - works at front office, typing exacerbates hand pain. Holding things for too long bothers her hands. - reports long standing h/o migraine headaches with photosensitivity. Advil fast gels helps with headaches. - reports she has been trying to dance more to increase her activity level but does feel back pain afterwards.  Allergies Broke out in hives after eating avocado today, red neck, received benadryl. No breathing problems, N/V. 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 Feels she may be allergic to avocado b/c she states her lips tingled previously with kiwi, mango and thinks she is allergic to those as well. Requests referral for allergy testing.  Anxiety - h/o mood d/o, not currently on medication - anxious a lot still, pain exacerbating anxiety - previously on meds, but made sleepy. Doesn't know the name of the med she was previously on.  Review of Systems:  Per HPI.  PMFSH, medications and smoking status reviewed.  Objective:   BP 108/70   Temp 98.7 F (37.1 C)  (Oral)   Ht 5\' 5"  (1.651 m)   Wt 183 lb (83 kg)   LMP 01/16/2019 (Approximate)   Breastfeeding No   BMI 30.45 kg/m  Vitals and nursing note reviewed.  General: obese female, in no acute distress with non-toxic appearance CV: regular rate and rhythm without murmurs, rubs, or gallops, no lower extremity edema Lungs: clear to auscultation bilaterally with normal work of breathing Abdomen: soft, non-tender, non-distended, normoactive bowel sounds Skin: warm, dry, no rashes or lesions Extremities: warm and well perfused, normal tone MSK: ROM grossly intact, 5/5 strength in U/LE bilaterally, gait normal. Point tenderness to palpation diffusely along upper and lower back.   Neuro: Alert and oriented, speech normal.  Psych: appropriate mood and affect  GAD 7 : Generalized Anxiety Score 02/16/2019 02/16/2019 07/14/2018 03/19/2017  Nervous, Anxious, on Edge 1 1 3 3   Control/stop worrying 1 1 2 1   Worry too much - different things 2 2 3 2   Trouble relaxing 1 1 2 3   Restless 1 1 2 1   Easily annoyed or irritable 2 2 3 2   Afraid - awful might happen 1 1 2 1   Total GAD 7 Score 9 9 17 13   Anxiety Difficulty Somewhat difficult Somewhat difficult Very difficult Somewhat difficult    Assessment & Plan:   Chronic back pain Present for the past ~6 months with not many alleviating factors. Previously evaluated by Sports Med and Rheumatology with negative autoimmune w/u and imaging thus far. Leading differential is fibromyalgia given negative w/u.  Plans to f/u with Rheumatology soon. Will refer to physical therapy for stretching and ROM exercises. No red flags to warrant further imaging at this time. Counseled on regular exercise and weight loss would likely also be of benefit.  Allergies Concern for food allergies per patient, requests referral to allergist for testing, referral placed. Advised to avoid foods that exacerbate her symptoms until formal testing.  Anxiety state H/o anxiety though not on  meds. Per chart review, h/o depression though no reports of suicidality (see last Pascal Lux note). Not currently depressive. Will start on cymbalta in hopes this can help control her anxiety as well as pain. Advised to call for adverse changes in mood. Plan to f/u by phone in a few weeks, instructed to f/u in one month.  Orders Placed This Encounter  Procedures  . Ambulatory referral to Physical Therapy    Referral Priority:   Routine    Referral Type:   Physical Medicine    Referral Reason:   Specialty Services Required    Requested Specialty:   Physical Therapy    Number of Visits Requested:   1  . Ambulatory referral to Allergy    Referral Priority:   Routine    Referral Type:   Allergy Testing    Referral Reason:   Specialty Services Required    Requested Specialty:   Allergy    Number of Visits Requested:   1   Meds ordered this encounter  Medications  . DULoxetine (CYMBALTA) 30 MG capsule    Sig: Take 1 capsule (30 mg total) by mouth daily. For one week, then increase to 2 pills (60mg ) daily.    Dispense:  60 capsule    Refill:  0    Ellwood Dense, DO PGY-2, [facility] 02/17/2019 5:01 PM

## 2019-02-16 ENCOUNTER — Other Ambulatory Visit: Payer: Self-pay

## 2019-02-16 ENCOUNTER — Encounter: Payer: Self-pay | Admitting: Family Medicine

## 2019-02-16 ENCOUNTER — Ambulatory Visit (INDEPENDENT_AMBULATORY_CARE_PROVIDER_SITE_OTHER): Payer: 59 | Admitting: Family Medicine

## 2019-02-16 VITALS — BP 108/70 | Temp 98.7°F | Ht 65.0 in | Wt 183.0 lb

## 2019-02-16 DIAGNOSIS — M353 Polymyalgia rheumatica: Secondary | ICD-10-CM

## 2019-02-16 DIAGNOSIS — T7840XA Allergy, unspecified, initial encounter: Secondary | ICD-10-CM

## 2019-02-16 DIAGNOSIS — M549 Dorsalgia, unspecified: Secondary | ICD-10-CM

## 2019-02-16 DIAGNOSIS — F411 Generalized anxiety disorder: Secondary | ICD-10-CM

## 2019-02-16 DIAGNOSIS — Z91018 Allergy to other foods: Secondary | ICD-10-CM | POA: Diagnosis not present

## 2019-02-16 DIAGNOSIS — G8929 Other chronic pain: Secondary | ICD-10-CM

## 2019-02-16 MED ORDER — DULOXETINE HCL 30 MG PO CPEP: 30.0000 mg | ORAL_CAPSULE | Freq: Every day | ORAL | 0 refills | Status: DC

## 2019-02-16 NOTE — Patient Instructions (Signed)
It was great to see you!  Our plans for today:  - We are referring you to Physical therapy for your pain.  - We are referring you to an allergist for testing for your allergies. - We are starting duloxetine (cymbalta) for your anxiety. This hopefully will also help your pain as well.  - Call Rheumatology and Sports Medicine for appointments for follow up.  Take care and seek immediate care sooner if you develop any concerns.   Dr. [facility]

## 2019-02-17 ENCOUNTER — Encounter: Payer: Self-pay | Admitting: Family Medicine

## 2019-02-17 DIAGNOSIS — F411 Generalized anxiety disorder: Secondary | ICD-10-CM | POA: Insufficient documentation

## 2019-02-17 DIAGNOSIS — T7840XA Allergy, unspecified, initial encounter: Secondary | ICD-10-CM | POA: Insufficient documentation

## 2019-02-17 NOTE — Assessment & Plan Note (Addendum)
Present for the past ~6 months with not many alleviating factors. Previously evaluated by Sports Med and Rheumatology with negative autoimmune w/u and imaging thus far. Leading differential is fibromyalgia given negative w/u. Plans to f/u with Rheumatology soon. Will refer to physical therapy for stretching and ROM exercises. No red flags to warrant further imaging at this time. Counseled on regular exercise and weight loss would likely also be of benefit.

## 2019-02-17 NOTE — Assessment & Plan Note (Signed)
Concern for food allergies per patient, requests referral to allergist for testing, referral placed. Advised to avoid foods that exacerbate her symptoms until formal testing.

## 2019-02-17 NOTE — Assessment & Plan Note (Addendum)
H/o anxiety though not on meds. Per chart review, h/o depression though no reports of suicidality (see last Pascal Lux note). Not currently depressive. Will start on cymbalta in hopes this can help control her anxiety as well as pain. Advised to call for adverse changes in mood. Plan to f/u by phone in a few weeks, instructed to f/u in one month.

## 2019-03-02 ENCOUNTER — Telehealth: Payer: Self-pay | Admitting: Family Medicine

## 2019-03-02 NOTE — Telephone Encounter (Signed)
Called patient to follow up on recent visit 2/25 where we started cymbalta. Patient has not been able to obtain this due to new insurance. She states she has been controlling her anxiety ok with doing yoga and stretching. Encouraged her to continue this. Advised if she had worsening in anxiety or finds it is becoming difficult to control to let us know. She verbalized understanding.  Ellwood Dense, DO PGY-2, [facility] 03/02/2019 1:55 PM

## 2019-03-19 ENCOUNTER — Telehealth: Payer: Self-pay | Admitting: Family Medicine

## 2019-03-19 ENCOUNTER — Telehealth: Payer: Self-pay

## 2019-03-19 ENCOUNTER — Encounter: Payer: Self-pay | Admitting: Family Medicine

## 2019-03-19 NOTE — Telephone Encounter (Signed)
Agree with below documentation. Will not provide letter to be out until 4/10.

## 2019-03-19 NOTE — Progress Notes (Signed)
Letter sent, see telephone encounter.

## 2019-03-19 NOTE — Telephone Encounter (Signed)
Patient called in this morning and this afternoon requesting a letter for work.  She states that she is a single mother with only 1 child, who is currently second she would like letter stating that she needs to be out of work from 3/30 to 4/10.  She states that her daughter developed diarrhea and a fever of 106 this AM.  She states that she took her to [facility], where the patient was tested for the flu which was negative.  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  Patient also states that she would like to be off of work because she has a "weakened immune system and gets sick easily."  Patient also notes that for the last week she has had a runny nose, cough, congestion, headache.  She denies any recent fevers.  She has asthma and is short of breath at baseline which requires her inhaler, but states that she does not believe that this has worsened.  She is speaking complete sentences on the phone and does not appear to be in any distress.  She does not have any known COVID exposure, although does work in a medical clinic.  Discussed this case with Dr. Deirdre Priest.  Advised the patient that I would be able to write her a note stating that she should remain out of work for 7 days after the onset of her symptoms and until she is 3 days without fever and improvement in her respiratory status.  Because I do not see any notation of the patient having a "immunodeficiency" on her problem list I do not see any immunosuppressive medications on her list, I do not feel comfortable writing her a letter stating that she needs to be out of work for this reason.  Also advised the patient that if she needs a letter to be out of work to care for her sick daughter, she should seek with her daughter's physician about this.  She voiced understanding of this.  Patient given appropriate return precautions  to the ED, including worsening shortness of breath with inability to speak in complete sentences or lips or fingers turning blue.  Patient voiced understanding of this.  We will forward this message to the patient's PCP, per her request.  She is still insistent that she would like a letter that excuses her from work until 4/10.

## 2019-03-19 NOTE — Telephone Encounter (Signed)
Unable to provide letter for this. Agree with Dr. Tamela Oddi assessment and documentation.

## 2019-03-19 NOTE — Telephone Encounter (Signed)
Pt called nurse line requesting a note for work to be excused. Pt stated her daughter is sick with fever and diarrhea. Pt stated I have a "weak immune system, you can see in my problem list." Pt stated since her daughter is sick she will catch whatever her daughter has. Pt also stated her daughters daycare is closed and she will have to care for her. Pt is requesting to be out of work starting 3/30-4/10. Please advise.

## 2019-03-22 NOTE — Telephone Encounter (Signed)
Informed pt of your decision and she is still requesting a work note for this week. Please advise.Vedanth Sirico Bruna Potter, CMA

## 2019-03-25 NOTE — Telephone Encounter (Signed)
Letter written for patient to provide to employer regarding reasons to stay home from work (improved symptoms and fever free). As she does not have a fever, she should be able to return to work. This was stated in the letter.

## 2019-03-29 ENCOUNTER — Other Ambulatory Visit: Payer: Self-pay

## 2019-03-29 ENCOUNTER — Encounter: Payer: Self-pay | Admitting: Allergy

## 2019-03-29 ENCOUNTER — Ambulatory Visit (INDEPENDENT_AMBULATORY_CARE_PROVIDER_SITE_OTHER): Payer: 59 | Admitting: Allergy

## 2019-03-29 VITALS — HR 84 | Temp 98.6°F | Resp 16 | Ht 64.0 in | Wt 179.0 lb

## 2019-03-29 DIAGNOSIS — R0602 Shortness of breath: Secondary | ICD-10-CM | POA: Insufficient documentation

## 2019-03-29 DIAGNOSIS — J3089 Other allergic rhinitis: Secondary | ICD-10-CM

## 2019-03-29 DIAGNOSIS — E739 Lactose intolerance, unspecified: Secondary | ICD-10-CM | POA: Insufficient documentation

## 2019-03-29 DIAGNOSIS — T781XXD Other adverse food reactions, not elsewhere classified, subsequent encounter: Secondary | ICD-10-CM

## 2019-03-29 DIAGNOSIS — Z91040 Latex allergy status: Secondary | ICD-10-CM

## 2019-03-29 DIAGNOSIS — T781XXA Other adverse food reactions, not elsewhere classified, initial encounter: Secondary | ICD-10-CM | POA: Insufficient documentation

## 2019-03-29 MED ORDER — ALBUTEROL SULFATE (SENSOR) 108 (90 BASE) MCG/ACT IN AEPB: 2.0000 | INHALATION_SPRAY | RESPIRATORY_TRACT | 1 refills | Status: DC | PRN

## 2019-03-29 MED ORDER — AUVI-Q 0.3 MG/0.3ML IJ SOAJ: 0.3000 mg | INTRAMUSCULAR | 1 refills | Status: DC | PRN

## 2019-03-29 MED ORDER — MONTELUKAST SODIUM 10 MG PO TABS: 10.0000 mg | ORAL_TABLET | Freq: Every day | ORAL | 5 refills | Status: DC

## 2019-03-29 NOTE — Assessment & Plan Note (Signed)
Patient most likely has lactose intolerance and may continue to consume lactose-free dairy products.

## 2019-03-29 NOTE — Patient Instructions (Addendum)
Today's skin testing showed: Positive to trees, grass, ragweed, weed, dust mites, cat, cockroach. Borderline to mold.   Allergic rhinitis:  We won't do any allergy eye drops at this time.   May use over the counter antihistamines such as Zyrtec (cetirizine), Claritin (loratadine), Allegra (fexofenadine), or Xyzal (levocetirizine) daily as needed.  Start Nasacort 1-2 sprays daily as needed for nasal congestion.  Nasal saline spray (i.e., Simply Saline) or nasal saline lavage (i.e., NeilMed) is recommended as needed and prior to medicated nasal sprays.  Start singulair 10mg  daily at night.  Cautioned that in some children/adults can experience behavioral changes including hyperactivity, agitation, depression, sleep disturbances and suicidal ideations. These side effects are rare, but if you notice them you should notify me and discontinue Singulair (montelukast).  Had a detailed discussion with patient/family that clinical history is suggestive of allergic rhinitis, and may benefit from allergy immunotherapy (AIT). Discussed in detail regarding the dosing, schedule, side effects (mild to moderate local allergic reaction and rarely systemic allergic reactions including anaphylaxis), and benefits (significant improvement in nasal symptoms, seasonal flares of asthma) of immunotherapy with the patient. There is significant time commitment involved with allergy shots, which includes weekly immunotherapy injections for first 9-12 months and then biweekly to monthly injections for 3-5 years.   Food: Continue to avoid foods that bother you. Get bloodwork as below. I have prescribed epinephrine injectable and demonstrated proper use. For mild symptoms you can take over the counter antihistamines such as Benadryl and monitor symptoms closely. If symptoms worsen or if you have severe symptoms including breathing issues, throat closure, significant swelling, whole body hives, severe diarrhea and vomiting,  lightheadedness then inject epinephrine and seek immediate medical care afterwards.  Breathing:   May use albuterol rescue inhaler 2 puffs or nebulizer every 4 to 6 hours as needed for shortness of breath, chest tightness, coughing, and wheezing. May use albuterol rescue inhaler 2 puffs 5 to 15 minutes prior to strenuous physical activities. Monitor frequency of use.   If using it more than twice a week let us know.   Follow up in 2 months  Skin care recommendations  Bath time: . Always use lukewarm water. AVOID very hot or cold water. Marland Kitchen. Keep bathing time to 5-10 minutes. . Do NOT use bubble bath. . Use a mild soap and use just enough to wash the dirty areas. . Do NOT scrub skin vigorously.  . After bathing, pat dry your skin with a towel. Do NOT rub or scrub the skin.  Moisturizers and prescriptions:  . ALWAYS apply moisturizers immediately after bathing (within 3 minutes). This helps to lock-in moisture. . Use the moisturizer several times a day over the whole body. Peri Jefferson. Good summer moisturizers include: Aveeno, CeraVe, Cetaphil. Peri Jefferson. Good winter moisturizers include: Aquaphor, Vaseline, Cerave, Cetaphil, Eucerin, Vanicream. . When using moisturizers along with medications, the moisturizer should be applied about one hour after applying the medication to prevent diluting effect of the medication or moisturize around where you applied the medications. When not using medications, the moisturizer can be continued twice daily as maintenance.  Laundry and clothing: . Avoid laundry products with added color or perfumes. . Use unscented hypo-allergenic laundry products such as Tide free, Cheer free & gentle, and All free and clear.  . If the skin still seems dry or sensitive, you can try double-rinsing the clothes. . Avoid tight or scratchy clothing such as wool. . Do not use fabric softeners or dyer sheets.  Reducing Pollen Exposure .  Pollen seasons: trees (spring), grass (summer) and  ragweed/weeds (fall). Marland Kitchen Keep windows closed in your home and car to lower pollen exposure.  Lilian Kapur air conditioning in the bedroom and throughout the house if possible.  . Avoid going out in dry windy days - especially early morning. . Pollen counts are highest between 5 - 10 AM and on dry, hot and windy days.  . Save outside activities for late afternoon or after a heavy rain, when pollen levels are lower.  . Avoid mowing of grass if you have grass pollen allergy. Marland Kitchen Be aware that pollen can also be transported indoors on people and pets.  . Dry your clothes in an automatic dryer rather than hanging them outside where they might collect pollen.  . Rinse hair and eyes before bedtime. Control of House Dust Mite Allergen . Dust mite allergens are a common trigger of allergy and asthma symptoms. While they can be found throughout the house, these microscopic creatures thrive in warm, humid environments such as bedding, upholstered furniture and carpeting. . Because so much time is spent in the bedroom, it is essential to reduce mite levels there.  . Encase pillows, mattresses, and box springs in special allergen-proof fabric covers or airtight, zippered plastic covers.  . Bedding should be washed weekly in hot water (130 F) and dried in a hot dryer. Allergen-proof covers are available for comforters and pillows that can't be regularly washed.  Reyes Ivan the allergy-proof covers every few months. Minimize clutter in the bedroom. Keep pets out of the bedroom.  Marland Kitchen Keep humidity less than 50% by using a dehumidifier or air conditioning. You can buy a humidity measuring device called a hygrometer to monitor this.  . If possible, replace carpets with hardwood, linoleum, or washable area rugs. If that's not possible, vacuum frequently with a vacuum that has a HEPA filter. . Remove all upholstered furniture and non-washable window drapes from the bedroom. . Remove all non-washable stuffed toys from the  bedroom.  Wash stuffed toys weekly. Cockroach Allergen Avoidance Cockroaches are often found in the homes of densely populated urban areas, schools or commercial buildings, but these creatures can lurk almost anywhere. This does not mean that you have a dirty house or living area. . Block all areas where roaches can enter the home. This includes crevices, wall cracks and windows.  . Cockroaches need water to survive, so fix and seal all leaky faucets and pipes. Have an exterminator go through the house when your family and pets are gone to eliminate any remaining roaches. Marland Kitchen Keep food in lidded containers and put pet food dishes away after your pets are done eating. Vacuum and sweep the floor after meals, and take out garbage and recyclables. Use lidded garbage containers in the kitchen. Wash dishes immediately after use and clean under stoves, refrigerators or toasters where crumbs can accumulate. Wipe off the stove and other kitchen surfaces and cupboards regularly. Pet Allergen Avoidance: . Contrary to popular opinion, there are no "hypoallergenic" breeds of dogs or cats. That is because people are not allergic to an animal's hair, but to an allergen found in the animal's saliva, dander (dead skin flakes) or urine. Pet allergy symptoms typically occur within minutes. For some people, symptoms can build up and become most severe 8 to 12 hours after contact with the animal. People with severe allergies can experience reactions in public places if dander has been transported on the pet owners' clothing. Marland Kitchen Keeping an animal outdoors is  only a partial solution, since homes with pets in the yard still have higher concentrations of animal allergens. . Before getting a pet, ask your allergist to determine if you are allergic to animals. If your pet is already considered part of your family, try to minimize contact and keep the pet out of the bedroom and other rooms where you spend a great deal of time. . As with  dust mites, vacuum carpets often or replace carpet with a hardwood floor, tile or linoleum. . High-efficiency particulate air (HEPA) cleaners can reduce allergen levels over time. . While dander and saliva are the source of cat and dog allergens, urine is the source of allergens from rabbits, hamsters, mice and [place] pigs; so ask a non-allergic family member to clean the animal's cage. . If you have a pet allergy, talk to your allergist about the potential for allergy immunotherapy (allergy shots). This strategy can often provide long-term relief.

## 2019-03-29 NOTE — Assessment & Plan Note (Signed)
Some respiratory symptoms during the springtime for the past 10 years.  Patient used albuterol as needed with good benefit.  Used it more often the last few weeks due to increased pollen in the air.  Will get spirometry at next visit instead of today due to COVID-19 pandemic and trying to minimize any type of aerosolizing procedures at this time in the office.   May use albuterol rescue inhaler 2 puffs or nebulizer every 4 to 6 hours as needed for shortness of breath, chest tightness, coughing, and wheezing. May use albuterol rescue inhaler 2 puffs 5 to 15 minutes prior to strenuous physical activities. Monitor frequency of use.   Hoping the Singulair will also help with these symptoms.   If using it more than twice a week let us know.

## 2019-03-29 NOTE — Assessment & Plan Note (Signed)
Perioral pruritus with kiwis and bananas.  Most recently she had perioral pruritus with hives after avocado ingestion.  There is a history of latex allergy as well.  Today's skin testing was negative to foods however clinical history strongly suggest avocado, kiwi and banana allergy and recommend continued avoidance.  Get bloodwork as below.  I have prescribed epinephrine injectable and demonstrated proper use. For mild symptoms you can take over the counter antihistamines such as Benadryl and monitor symptoms closely. If symptoms worsen or if you have severe symptoms including breathing issues, throat closure, significant swelling, whole body hives, severe diarrhea and vomiting, lightheadedness then inject epinephrine and seek immediate medical care afterwards.

## 2019-03-29 NOTE — Progress Notes (Signed)
New Patient Note  RE: Shelby Graves MRN: [id number] DOB: [birth year] Date of Office Visit: 03/29/2019  Referring provider: Nestor Ramp, MD Primary care provider: Ellwood Dense, DO  Chief Complaint: Allergic Rhinitis  (sneezing, runny nose. seems to have worsened over the past 2 years. some sore throat/swelling sensation in throat. ) and Urticaria (occurred after consuming avocado. she does have throat swelling and itching with Kiwi. )  History of Present Illness: I had the pleasure of seeing Shelby Graves for initial evaluation at the [facility] on 03/29/2019. She is a 28 y.o. female, who is referred here by Ellwood Dense, DO for the evaluation of allergic rhinitis, food allergy.   Rhinitis: She reports symptoms of sneezing rhinorrhea, nasal congestion, PND, itchy eyes. Symptoms have been worsening the past 2-3 years. The symptoms are present during the spring and summer. Anosmia: no. Headache: yes. She has used zyrtec, Claritin, benadryl, Singulair, Flonase with minimal improvement in symptoms. Nasal sprays irritate her sinuses too much. Tried some OTC eye drops but they burnt. She usually has issues with dry eyes. Sinus infections: one. Previous work up includes: none. Previous ENT evaluation: no.  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 The kiwi reaction occurred as a teenager. She had a piece of fresh kiwi and had perioral pruritus immediately. Symptoms lasted for a few hours.   The avocado reaction occurred about 1 month ago. She had perioral pruritus, neck pruritus and hives. The skin symptoms lasted for up to 1 week. Patient used to eat minimal amounts of avocados previously with no issues.   She does not have access to epinephrine autoinjector.  Past work up includes: None. Dietary History: patient has been eating other foods including limited milk, eggs, peanut, treenuts, sesame, shellfish, seafood, soy, wheat, meats, fruits and  vegetables.  She reports reading labels and avoiding avocado, kiwi in diet completely.  She also avoid bananas due to perioral pruritus reasons and breaks out in rash from latex contact.  Assessment and Plan: Shelby Graves is a 28 y.o. female with: Other allergic rhinitis Rhinoconjunctivitis symptoms for the past 2 to 3 years mainly in the spring and summer.  She tried over-the-counter Zyrtec, Claritin, Benadryl, Singulair and Flonase with minimal benefit.  Flonase irritated her sinuses too much and over-the-counter eyedrops did not seem to work.  History of dry eyes as well.  Today's skin testing showed: Positive to trees, grass, ragweed, weed, dust mites, cat, cockroach. Borderline to mold.   We won't do any allergy eye drops at this time as they can cause more drying.  May use over the counter antihistamines such as Zyrtec (cetirizine), Claritin (loratadine), Allegra (fexofenadine), or Xyzal (levocetirizine) daily as needed.  Start Nasacort 1-2 sprays daily as needed for nasal congestion.  Sample given and demonstrated proper use.  Nasal saline spray (i.e., Simply Saline) or nasal saline lavage (i.e., NeilMed) is recommended as needed and prior to medicated nasal sprays.  Start Singulair 10mg  daily at night.  Cautioned that in some children/adults can experience behavioral changes including hyperactivity, agitation, depression, sleep disturbances and suicidal ideations. These side effects are rare, but if you notice them you should notify me and discontinue Singulair (montelukast).  Had a detailed discussion with patient/family that clinical history is suggestive of allergic rhinitis, and may benefit from allergy immunotherapy (AIT). Discussed in detail regarding the dosing, schedule, side effects (mild to moderate local allergic reaction and rarely systemic allergic reactions including anaphylaxis), and benefits (significant improvement  in nasal symptoms, seasonal flares of asthma) of  immunotherapy with the patient. There is significant time commitment involved with allergy shots, which includes weekly immunotherapy injections for first 9-12 months and then biweekly to monthly injections for 3-5 years.   If above regimen does not control symptoms then will discuss allergy immunotherapy in more detail at next visit.  Adverse food reaction Perioral pruritus with kiwis and bananas.  Most recently she had perioral pruritus with hives after avocado ingestion.  There is a history of latex allergy as well.  Today's skin testing was negative to foods however clinical history strongly suggest avocado, kiwi and banana allergy and recommend continued avoidance.  Get bloodwork as below.  I have prescribed epinephrine injectable and demonstrated proper use. For mild symptoms you can take over the counter antihistamines such as Benadryl and monitor symptoms closely. If symptoms worsen or if you have severe symptoms including breathing issues, throat closure, significant swelling, whole body hives, severe diarrhea and vomiting, lightheadedness then inject epinephrine and seek immediate medical care afterwards.  Lactose intolerance Patient most likely has lactose intolerance and may continue to consume lactose-free dairy products.  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  Continue avoidance and will check latex IgE level.  Shortness of breath Some respiratory symptoms during the springtime for the past 10 years.  Patient used albuterol as needed with good benefit.  Used it more often the last few weeks due to increased pollen in the air.  Will get spirometry at next visit instead of today due to COVID-19 pandemic and trying to minimize any type of aerosolizing procedures at this time in the office.   May use albuterol rescue inhaler 2 puffs or nebulizer every 4 to 6 hours as needed for shortness of breath, chest tightness, coughing, and wheezing. May use albuterol rescue inhaler 2  puffs 5 to 15 minutes prior to strenuous physical activities. Monitor frequency of use.   Hoping the Singulair will also help with these symptoms.   If using it more than twice a week let us know.  Return in about 2 months (around 05/29/2019).  Meds ordered this encounter  Medications   montelukast (SINGULAIR) 10 MG tablet    Sig: Take 1 tablet (10 mg total) by mouth at bedtime.    Dispense:  30 tablet    Refill:  5   Albuterol Sulfate, sensor, 108 (90 Base) MCG/ACT AEPB    Sig: Inhale 2 puffs into the lungs every 4 (four) hours as needed.    Dispense:  1 each    Refill:  1   AUVI-Q 0.3 MG/0.3ML SOAJ injection    Sig: Inject 0.3 mLs (0.3 mg total) into the muscle as needed for anaphylaxis.    Dispense:  2 Device    Refill:  1    Lab Orders     Latex, IgE     Allergen Avocado f96     Allergen, Banana f92     Allergen, Kiwi Fruit, f84  Other allergy screening: Asthma: yes  She reports symptoms of chest tightness, shortness of breath, coughing, wheezing for 10 years. Current medications include albuterol prn which help. She reports not using aerochamber with inhalers. She tried the following inhalers: albuterol prn. Main triggers are spring time allergies, exertion sometimes. In the last month, frequency of symptoms: daily the last few weeks. Frequency of nocturnal symptoms: 0x/month. Frequency of SABA use: twice last week. Interference with physical activity: sometimes. Sleep is undisturbed. In the last 12 months, emergency room  visits/urgent care visits/doctor office visits or hospitalizations due to respiratory issues: 0. In the last 12 months, oral steroids courses: 0. Lifetime history of hospitalization for respiratory issues: no. Prior intubations: no. History of pneumonia: no. She was not evaluated by allergist/pulmonologist in the past. Smoking exposure: no. Up to date with flu vaccine: no.   Medication allergy: yes  Latex - rash Hymenoptera allergy: no Eczema:no History  of recurrent infections suggestive of immunodeficency: no  Diagnostics: Skin Testing: Environmental allergy panel and select foods. Positive test to: trees, grass, ragweed, weed, dust mites, cat, cockroach. Borderline to mold. Negative test to: foods.  Results discussed with patient/family. Airborne Adult Perc - 03/29/19 1412    Time Antigen Placed  0220    Allergen Manufacturer  Waynette ButteryGreer    Location  Back    Number of Test  59    Panel 1  Select    1. Control-Buffer 50% Glycerol  Negative    2. Control-Histamine 1 mg/ml  3+    3. Albumin saline  Negative    4. Bahia  4+    5. [place]  4+    6. Johnson  4+    7. [place] Blue  4+    8. Meadow Fescue  4+    9. Perennial Rye  4+    10. Sweet Vernal  4+    11. Timothy  4+    12. Cocklebur  2+    13. Burweed Marshelder  Negative    14. Ragweed, short  4+    15. Ragweed, Giant  3+    16. Plantain,  English  3+    17. Lamb's Quarters  3+    18. Sheep Sorrell  3+    19. Rough Pigweed  3+    20. Marsh Elder, Rough  3+    21. Mugwort, Common  4+    22. Ash mix  4+    23. Birch mix  4+    24. Beech American  4+    25. Box, Elder  4+    26. Cedar, red  Negative    27. Cottonwood, Eastern  4+    28. Elm mix  2+    29. Hickory mix  4+    30. Maple mix  2+    31. Oak, Guinea[place]Biss[place]astern mix  2+    32. Pecan Pollen  4+    33. Pine mix  Negative    34. Sycamore Eastern  4+    35. Walnut, Black Pollen  4+    36. Alternaria alternata  Negative    37. Cladosporium Herbarum  Negative    38. Aspergillus mix  Negative    39. Penicillium mix  Negative    40. Bipolaris sorokiniana (Helminthosporium)  Negative    41. Drechslera spicifera (Curvularia)  Negative    42. Mucor plumbeus  Negative    43. Fusarium moniliforme  Negative    44. Aureobasidium pullulans (pullulara)  Negative    45. Rhizopus oryzae  Negative    46. Botrytis cinera  Negative    47. Epicoccum nigrum  Negative    48. Phoma betae  Negative    49. Candida Albicans  Negative     50. Trichophyton mentagrophytes  Negative    51. Mite, D Farinae  5,000 AU/ml  2+    52. Mite, D Pteronyssinus  5,000 AU/ml  2+    53. Cat Hair 10,000 BAU/ml  Negative    54.  Dog Epithelia  Negative  55. Mixed Feathers  Negative    56. Horse Epithelia  Negative    57. Cockroach, German  Negative    58. Mouse  Negative    59. 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   Lower abdominal pain 07/14/2018   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   DUB (dysfunctional uterine bleeding) 12/17/2017   Healthcare maintenance 06/03/2017   Axillary mass, left 01/08/2017   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 No pertinent surgical history. Medication List:  Current Outpatient Medications  Medication Sig Dispense Refill   DULoxetine  (CYMBALTA) 30 MG capsule Take 1 capsule (30 mg total) by mouth daily. For one week, then increase to 2 pills (60mg ) daily. 60 capsule 0   ferrous sulfate 325 (65 FE) MG tablet Take 1 tablet (325 mg total) by mouth 2 (two) times daily with a meal. 60 tablet 3   Multiple Vitamins-Minerals (MULTIVITAMIN PO) Take by mouth.     Prenat-FeCbn-FeAspGl-FA-Omega (OB COMPLETE PETITE) 35-5-1-200 MG CAPS Take 1 capsule by mouth daily.     Vitamin D, Ergocalciferol, (DRISDOL) 1.25 MG (50000 UT) CAPS capsule Take 1 capsule (50,000 Units total) by mouth 2 (two) times a week. 24 capsule 0   Albuterol Sulfate, sensor, 108 (90 Base) MCG/ACT AEPB Inhale 2 puffs into the lungs every 4 (four) hours as needed. 1 each 1   AUVI-Q 0.3 MG/0.3ML SOAJ injection Inject 0.3 mLs (0.3 mg total) into the muscle as needed for anaphylaxis. 2 Device 1   montelukast (SINGULAIR) 10 MG tablet Take 1 tablet (10 mg total) by mouth at bedtime. 30 tablet 5   No current facility-administered medications for this visit.    Allergies: Allergies  Allergen Reactions   Latex Hives   Social History: Social History   Socioeconomic History   Marital status: Single    Spouse name: Not on file   Number of children: 1   Years of education: 16   Highest education level: Bachelor's degree (e.g., BA, AB,  BS)  Occupational History    Comment: [employer]  Social Needs   Financial resource strain: Not on file   Food insecurity:    Worry: Not on file    Inability: Not on file   Transportation needs:    Medical: Not on file    Non-medical: Not on file  Tobacco Use   Smoking status: Former Smoker    Types: Cigarettes    Last attempt to quit: 10/21/2013    Years since quitting: 5.4   Smokeless tobacco: Never Used   Tobacco comment: quit yrs ago - [facility] FOR 1 YEAR  Substance and Sexual Activity   Alcohol use: Not Currently   Drug use: Not Currently    Types: Marijuana   Sexual activity: Not on file    Lifestyle   Physical activity:    Days per week: Not on file    Minutes per session: Not on file   Stress: Not on file  Relationships   Social connections:    Talks on phone: Not on file    Gets together: Not on file    Attends religious service: Not on file    Active member of club or organization: Not on file    Attends meetings of clubs or organizations: Not on file    Relationship status: Not on file  Other Topics Concern   Not on file  Social History Narrative   Lives with family, 2 old dgtr 09/2018    Own apartment.    College graduate   Restaurant   1 child   Lives in an apartment. Smoking: denies Occupation: Radiation protection practitioner HistorySurveyor, minerals in the house: yes Carpet in the family room: yes Carpet in the bedroom: yes Heating: electric Cooling: central Pet: no  Family History: Family History  Problem Relation Age of Onset   Hyperlipidemia Mother    Hypertension Mother    Kidney disease Mother    Hypertension Father    Hypertension Brother    Hyperlipidemia Brother    Diabetes Maternal Grandmother    Diabetes Maternal Grandfather    Cancer Maternal Aunt 55       Ovarian CA   Problem                               Relation Asthma                                   Mother  Eczema                                No  Food allergy                          No  Allergic rhino conjunctivitis     Brother   Review of Systems  Constitutional: Negative for appetite change, chills, fever and unexpected weight change.  HENT: Positive for congestion, postnasal drip, rhinorrhea and sneezing.   Eyes: Positive for itching.  Respiratory: Negative for cough, chest tightness, shortness of breath and wheezing.   Cardiovascular: Negative for chest pain.  Gastrointestinal: Negative for abdominal pain.  Genitourinary: Negative for difficulty urinating.  Skin: Negative for rash.  Allergic/Immunologic: Positive for environmental  allergies.  Neurological: Positive for headaches.  Objective: Pulse 84    Temp 98.6 F (37 C) (Tympanic)    Resp 16    Ht  (1.626 m)    Wt 179 lb (81.2 kg)    SpO2 96%    BMI 30.73 kg/m  Body mass index is 30.73 kg/m. Physical Exam  Constitutional: She is oriented to person, place, and time. She appears well-developed and well-nourished.  HENT:  Head: Normocephalic and atraumatic.  Right Ear: External ear normal.  Left Ear: External ear normal.  Nose: Nose normal.  Mouth/Throat: Oropharynx is clear and moist.  Eyes: Conjunctivae and EOM are normal.  Neck: Neck supple.  Cardiovascular: Normal rate, regular rhythm and normal heart sounds. Exam reveals no gallop and no friction rub.  No murmur heard. Pulmonary/Chest: Effort normal and breath sounds normal. She has no wheezes. She has no rales.  Abdominal: Soft. Bowel sounds are normal. There is no abdominal tenderness.  Lymphadenopathy:    She has no cervical adenopathy.  Neurological: She is alert and oriented to person, place, and time.  Skin: Skin is warm. No rash noted.  Psychiatric: She has a normal mood and affect. Her behavior is normal.  Nursing note and vitals reviewed.  The plan was reviewed with the patient/family, and all questions/concerned were addressed.  It was my pleasure to see Shelby Graves today and participate in her care. Please feel free to contact me with any questions or concerns.  Sincerely,  Wyline Mood, DO Allergy & Immunology  [facility] office: [phone] [facility] office: [phone]

## 2019-03-29 NOTE — Assessment & Plan Note (Signed)
Rash after latex exposure.  Continue avoidance and will check latex IgE level.

## 2019-03-29 NOTE — Assessment & Plan Note (Signed)
Rhinoconjunctivitis symptoms for the past 2 to 3 years mainly in the spring and summer.  She tried over-the-counter Zyrtec, Claritin, Benadryl, Singulair and Flonase with minimal benefit.  Flonase irritated her sinuses too much and over-the-counter eyedrops did not seem to work.  History of dry eyes as well.  Today's skin testing showed: Positive to trees, grass, ragweed, weed, dust mites, cat, cockroach. Borderline to mold.   We won't do any allergy eye drops at this time as they can cause more drying.  May use over the counter antihistamines such as Zyrtec (cetirizine), Claritin (loratadine), Allegra (fexofenadine), or Xyzal (levocetirizine) daily as needed.  Start Nasacort 1-2 sprays daily as needed for nasal congestion.  Sample given and demonstrated proper use.  Nasal saline spray (i.e., Simply Saline) or nasal saline lavage (i.e., NeilMed) is recommended as needed and prior to medicated nasal sprays.  Start Singulair 10mg  daily at night.  Cautioned that in some children/adults can experience behavioral changes including hyperactivity, agitation, depression, sleep disturbances and suicidal ideations. These side effects are rare, but if you notice them you should notify me and discontinue Singulair (montelukast).  Had a detailed discussion with patient/family that clinical history is suggestive of allergic rhinitis, and may benefit from allergy immunotherapy (AIT). Discussed in detail regarding the dosing, schedule, side effects (mild to moderate local allergic reaction and rarely systemic allergic reactions including anaphylaxis), and benefits (significant improvement in nasal symptoms, seasonal flares of asthma) of immunotherapy with the patient. There is significant time commitment involved with allergy shots, which includes weekly immunotherapy injections for first 9-12 months and then biweekly to monthly injections for 3-5 years.   If above regimen does not control symptoms then will  discuss allergy immunotherapy in more detail at next visit.

## 2019-03-31 ENCOUNTER — Encounter: Payer: Self-pay | Admitting: Allergy

## 2019-03-31 LAB — ALLERGEN, KIWI FRUIT, F84: Kiwi Fruit: 0.26 kU/L — AB

## 2019-03-31 LAB — LATEX, IGE: Latex IgE: 0.36 kU/L — AB

## 2019-03-31 LAB — ALLERGEN AVOCADO F96: F096-IgE Avocado: 0.15 kU/L — AB

## 2019-03-31 LAB — ALLERGEN BANANA: Allergen Banana IgE: 0.26 kU/L — AB

## 2019-04-20 NOTE — Progress Notes (Signed)
Virtual Visit via Telephone Note  I connected with Shelby Graves on 04/20/19 at 11:45 AM EDT by telephone and verified that I am speaking with the correct person using two identifiers.   I discussed the limitations, risks, security and privacy concerns of performing an evaluation and management service by telephone and the availability of in person appointments. I also discussed with the patient that there may be a patient responsible charge related to this service. The patient expressed understanding and agreed to proceed. This service was conducted via virtual visit.  Patient unable to use Webex so we reached her by telephone. The patient was located at home. I was located in my office.  Consent was obtained prior to the virtual visit and is aware of possible charges through their insurance for this visit.  The patient is an established patient.  Dr. Estanislado Pandy, MD conducted the virtual visit and Hazel Sams, PA-C acted as scribe during the service.  Office staff helped with scheduling follow up visits after the service was conducted.    CC: Generalized pain   History of Present Illness: Patient is a 28 year old female with a past medical history of myofascial pain and polyarthralgia. She has been experiencing muscle tenderness and muscle spasms. She has trapezius muscle spasms.  She has been having more frequent migraines related to muscle tension and stress.  She has been experiencing right knee joint pain.  She has no joint swelling.  She has not had any recent injuries. She did not start on Cymbalta due to insurance not covering it.  She has chronic fatigue and interrupted sleep at night.  She has difficulty sleeping at night due to the pain she experiences.  She is taking vitamin D 50,000 units by mouth twice weekly.  She is due to recheck vitamin D level.  She is also taking a prenatal vitamin daily.   Review of Systems  Constitutional: Positive for malaise/fatigue. Negative for fever.   Eyes: Negative for photophobia, pain, discharge and redness.  Respiratory: Negative for cough, shortness of breath and wheezing.   Cardiovascular: Negative for chest pain and palpitations.  Gastrointestinal: Negative for blood in stool, constipation and diarrhea.  Genitourinary: Negative for dysuria.  Musculoskeletal: Positive for joint pain, myalgias and neck pain. Negative for back pain.  Skin: Negative for rash.  Neurological: Negative for dizziness and headaches.  Psychiatric/Behavioral: Positive for depression. The patient has insomnia. The patient is not nervous/anxious.       Observations/Objective: Physical Exam  Constitutional: She is oriented to person, place, and time.  Neurological: She is alert and oriented to person, place, and time.  Psychiatric: Mood, memory, affect and judgment normal.   Patient reports morning stiffness for 10  minutes.   Patient reports nocturnal pain.  Difficulty dressing/grooming: Denies Difficulty climbing stairs: Reports Difficulty getting out of chair: Denies Difficulty using hands for taps, buttons, cutlery, and/or writing: Denies  12/29/18 Vit D 19, SPEP normal, CCP Ab neg, HLA B27 neg  08/18/18: CK 107, ANA negative, sed rate 14, RF<10, TSH 1.580, magnesium 1.9  Assessment and Plan: Myofascial pain syndrome: She continues to have generalized muscle aches and muscle tenderness.   She has been experiencing trapezius muscle spasms and tenderness. She is experiencing more frequent migraines due to the muscle tension. She was encouraged to continue to stretch and exercise on a regular basis. She has been performing yoga exercising regularly. She has not started on Cymbalta due to her insurance requiring a prior authorization.  She was  advised to follow up with PCP about the status of the piror authorization.  We feel that Cymbalta will be effective to help with her depression and pain.  She continues to have chronic fatigue and insomnia.  She has  interrupted sleep at night due to the pain she experiences.  We discussed trying melatonin at bedtime PRN for insomnia. She is planning on getting a new mattress.  Good sleep hygiene habits were discussed. She will follow up as needed. She may benefit from a muscle relaxer.  Family history of fibromyalgia: Mother  Polyarthralgia: 08/18/18: CK 107, ANA negative, sed rate 14, RF<10, TSH 1.580, magnesium 1.9. 12/29/18 Vit D 19, SPEP normal, CCP Ab neg, HLA B27 neg.  Xray of the bilateral hands, bilateral feet, SI joints and lower back were unremarkable at the last visit: Lab work and X-rays from the previous visit were reviewed with the patient.  All questions were addressed.  She was advised to notify us if she develops increased joint pain or joint swelling.   Vitamin D deficiency: Vitamin D was 19 on 12/29/18.  She is taking Vitamin D 50,000 units by mouth twice weekly x3 months.  She is due to recheck vitamin D.  A future order will be placed today.    Follow Up Instructions: She will follow as needed.   Future lab order for vitamin D was placed today.    I discussed the assessment and treatment plan with the patient. The patient was provided an opportunity to ask questions and all were answered. The patient agreed with the plan and demonstrated an understanding of the instructions.   The patient was advised to call back or seek an in-person evaluation if the symptoms worsen or if the condition fails to improve as anticipated.  I provided 25 minutes of non-face-to-face time during this encounter. Bo Merino, MD   Scribed by-  Ofilia Neas, PA-C

## 2019-04-22 ENCOUNTER — Telehealth (INDEPENDENT_AMBULATORY_CARE_PROVIDER_SITE_OTHER): Payer: 59 | Admitting: Rheumatology

## 2019-04-22 ENCOUNTER — Encounter: Payer: Self-pay | Admitting: Rheumatology

## 2019-04-22 ENCOUNTER — Telehealth: Payer: Self-pay | Admitting: *Deleted

## 2019-04-22 DIAGNOSIS — F39 Unspecified mood [affective] disorder: Secondary | ICD-10-CM

## 2019-04-22 DIAGNOSIS — M2550 Pain in unspecified joint: Secondary | ICD-10-CM

## 2019-04-22 DIAGNOSIS — Z8709 Personal history of other diseases of the respiratory system: Secondary | ICD-10-CM

## 2019-04-22 DIAGNOSIS — M7918 Myalgia, other site: Secondary | ICD-10-CM | POA: Diagnosis not present

## 2019-04-22 DIAGNOSIS — E559 Vitamin D deficiency, unspecified: Secondary | ICD-10-CM

## 2019-04-22 DIAGNOSIS — F32A Depression, unspecified: Secondary | ICD-10-CM

## 2019-04-22 DIAGNOSIS — F419 Anxiety disorder, unspecified: Secondary | ICD-10-CM

## 2019-04-22 DIAGNOSIS — F329 Major depressive disorder, single episode, unspecified: Secondary | ICD-10-CM

## 2019-04-22 DIAGNOSIS — Z8669 Personal history of other diseases of the nervous system and sense organs: Secondary | ICD-10-CM

## 2019-04-22 DIAGNOSIS — Z862 Personal history of diseases of the blood and blood-forming organs and certain disorders involving the immune mechanism: Secondary | ICD-10-CM

## 2019-04-22 DIAGNOSIS — Z8269 Family history of other diseases of the musculoskeletal system and connective tissue: Secondary | ICD-10-CM

## 2019-04-22 NOTE — Telephone Encounter (Signed)
Patient was seen for a telemedicine visit today 04/22/19 and was advised to follow up PRN.

## 2019-05-11 ENCOUNTER — Emergency Department (HOSPITAL_COMMUNITY): Payer: 59

## 2019-05-11 ENCOUNTER — Emergency Department (HOSPITAL_COMMUNITY)
Admission: EM | Admit: 2019-05-11 | Discharge: 2019-05-11 | Disposition: A | Discharge status: Home / Self Care | Payer: 59 | Attending: Emergency Medicine | Admitting: Emergency Medicine

## 2019-05-11 ENCOUNTER — Other Ambulatory Visit: Payer: Self-pay

## 2019-05-11 ENCOUNTER — Encounter (HOSPITAL_COMMUNITY): Payer: Self-pay | Admitting: Emergency Medicine

## 2019-05-11 DIAGNOSIS — Z79899 Other long term (current) drug therapy: Secondary | ICD-10-CM | POA: Diagnosis not present

## 2019-05-11 DIAGNOSIS — Z91040 Latex allergy status: Secondary | ICD-10-CM | POA: Insufficient documentation

## 2019-05-11 DIAGNOSIS — Z87891 Personal history of nicotine dependence: Secondary | ICD-10-CM | POA: Diagnosis not present

## 2019-05-11 DIAGNOSIS — Z20828 Contact with and (suspected) exposure to other viral communicable diseases: Secondary | ICD-10-CM | POA: Diagnosis not present

## 2019-05-11 DIAGNOSIS — R0602 Shortness of breath: Secondary | ICD-10-CM | POA: Diagnosis not present

## 2019-05-11 DIAGNOSIS — H1032 Unspecified acute conjunctivitis, left eye: Secondary | ICD-10-CM | POA: Insufficient documentation

## 2019-05-11 DIAGNOSIS — H5789 Other specified disorders of eye and adnexa: Secondary | ICD-10-CM | POA: Diagnosis present

## 2019-05-11 DIAGNOSIS — Z20822 Contact with and (suspected) exposure to covid-19: Secondary | ICD-10-CM

## 2019-05-11 MED ORDER — FLUORESCEIN SODIUM 1 MG OP STRP
1.0000 | ORAL_STRIP | Freq: Once | OPHTHALMIC | Status: AC
  Administered 2019-05-11: 12:00:00 1 via OPHTHALMIC
  Filled 2019-05-11: qty 1

## 2019-05-11 MED ORDER — TETRACAINE HCL 0.5 % OP SOLN
2.0000 [drp] | Freq: Once | OPHTHALMIC | Status: AC
  Administered 2019-05-11: 12:00:00 2 [drp] via OPHTHALMIC
  Filled 2019-05-11: qty 4

## 2019-05-11 NOTE — ED Provider Notes (Signed)
[facility]-EMERGENCY DEPT Provider Note   CSN: [id number] Arrival date & time: 05/11/19  1104    History   Chief Complaint Chief Complaint  Patient presents with  . Conjunctivitis    HPI Shelby Graves is a 28 y.o. female.     The history is provided by the patient and medical records. No language interpreter was used.  Conjunctivitis    Shelby Graves is a 28 y.o. female who presents to the Emergency Department complaining of sob, eye pain. She presents to the emergency department complaining of itching, burning and pain to the left eye that began on Sunday. She did a telehealth visit and was started on eyedrops. She has no significant improvement in her symptoms. She also complains of several days of cough, sneeze, shortness of breath, poor appetite and loss of taste and smell. She has no known sick contacts but her daughter is in daycare and has been coughing and sneezing as well. She also works at a clinic front office. No known coronavirus exposures. She does have a history of asthma as well as allergies and is compliant with her home medications. Symptoms are mild to moderate and constant nature. She does not use contacts or corrective lenses. Past Medical History:  Diagnosis Date  . Anemia 2012  . Angio-edema   . Anxiety   . Arthritis   . Asthma 2009   last used inhaler 2009  . Depression   . H/O varicella   . 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 History of gonorrhea   . Hx of chlamydia infection   . Hyperventilation   . Migraine 2010  . PID (pelvic inflammatory disease) 2012   GC and chlamydia   . Urticaria     Patient Active Problem List   Diagnosis Date Noted  . Other allergic rhinitis 03/29/2019  . Adverse food reaction 03/29/2019  . Lactose intolerance 03/29/2019  . Latex allergy 03/29/2019  . Shortness of breath 03/29/2019  . Allergies 02/17/2019  . Anxiety state 02/17/2019  . 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 Lower abdominal pain 07/14/2018  . 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 DUB (dysfunctional uterine bleeding) 12/17/2017  . Healthcare maintenance 06/03/2017  . Axillary mass, left 01/08/2017  . 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 Chronic back pain 12/23/2014  . Mood disorder (HCC) 12/02/2012  . IDA (iron deficiency anemia) 07/16/2012  . Vitamin D deficiency 07/16/2012    History reviewed. No pertinent surgical history.   OB History    Gravida  1   Para  0   Term  0   Preterm  0   AB  0   Living  0     SAB  0   TAB  0   Ectopic  0   Multiple  0   Live Births               Home Medications    Prior to Admission medications   Medication Sig Start Date End Date Taking? Authorizing Provider  Albuterol Sulfate, sensor, 108 (90 Base) MCG/ACT AEPB Inhale 2 puffs into the lungs every 4 (four) hours as needed. Patient taking differently: Inhale 2 puffs into the lungs every 4 (four) hours as needed (shortness of breath/wheezing).  03/29/19  Yes Ellamae Sia, DO  AUVI-Q 0.3 MG/0.3ML SOAJ injection Inject 0.3 mLs (0.3 mg total) into the muscle as needed for anaphylaxis. 03/29/19  Yes Ellamae Sia, DO  erythromycin ophthalmic  ointment Place 1 application into both eyes 4 (four) times daily. 05/09/19  Yes [provider]  ferrous sulfate 325 (65 FE) MG tablet Take 1 tablet (325 mg total) by mouth 2 (two) times daily with a meal. 07/14/18  Yes Rumball, Alison, DO  mometasone (NASONEX) 50 MCG/ACT nasal spray Place 2 sprays into the nose daily as needed (congestion).   Yes [provider]  montelukast (SINGULAIR) 10 MG tablet Take 1 tablet (10 mg total) by mouth at bedtime. 03/29/19  Yes Ellamae Sia, DO  Multiple Vitamins-Minerals (MULTIVITAMIN PO) Take 1 tablet by mouth daily.    Yes [provider]  Polyvinyl Alcohol-Povidone PF (REFRESH) 1.4-0.6 % SOLN Apply 1 drop to eye as needed (dry eyes).   Yes [provider]   Prenat-FeCbn-FeAspGl-FA-Omega (OB COMPLETE PETITE) 35-5-1-200 MG CAPS Take 1 capsule by mouth daily.   Yes [provider]  Vitamin D, Ergocalciferol, (DRISDOL) 1.25 MG (50000 UT) CAPS capsule Take 1 capsule (50,000 Units total) by mouth 2 (two) times a week. 12/31/18  Yes Deveshwar, Janalyn Rouse, MD  DULoxetine (CYMBALTA) 30 MG capsule Take 1 capsule (30 mg total) by mouth daily. For one week, then increase to 2 pills (60mg ) daily. Patient not taking: Reported on 05/11/2019 02/16/19   Ellwood Dense, DO    Family History Family History  Problem Relation Age of Onset  . Hyperlipidemia Mother   . Hypertension Mother   . Kidney disease Mother   . Hypertension Father   . Hypertension Brother   . Hyperlipidemia Brother   . Diabetes Maternal Grandmother   . Diabetes Maternal Grandfather   . Cancer Maternal Aunt 17       Ovarian CA    Social History Social History   Tobacco Use  . Smoking status: Former Smoker    Types: Cigarettes    Last attempt to quit: 10/21/2013    Years since quitting: 5.5  . Smokeless tobacco: Never Used  . Tobacco comment: quit yrs ago - OCC FOR 1 YEAR  Substance Use Topics  . Alcohol use: Not Currently  . Drug use: Not Currently    Types: Marijuana     Allergies   Latex   Review of Systems Review of Systems  All other systems reviewed and are negative.    Physical Exam Updated Vital Signs BP 114/67 (BP Location: Left Arm)   Pulse 91   Temp 98.9 F (37.2 C) (Oral)   Resp 17   LMP 04/26/2019   SpO2 99%   Physical Exam Vitals signs and nursing note reviewed.  Constitutional:      Appearance: She is well-developed.  HENT:     Head: Normocephalic and atraumatic.  Eyes:     Comments: PERRL. Mild left-sided conjunctival injection. EOM I. No uptake on fluorosceine.  Cardiovascular:     Rate and Rhythm: Normal rate and regular rhythm.  Pulmonary:     Effort: Pulmonary effort is normal. No respiratory distress.  Abdominal:      Palpations: Abdomen is soft.     Tenderness: There is no abdominal tenderness. There is no guarding or rebound.  Musculoskeletal:        General: No swelling or tenderness.  Skin:    General: Skin is warm and dry.  Neurological:     Mental Status: She is alert and oriented to person, place, and time.  Psychiatric:        Behavior: Behavior normal.      ED Treatments / Results  Labs (all  labs ordered are listed, but only abnormal results are displayed) Labs Reviewed  NOVEL CORONAVIRUS, NAA (HOSPITAL ORDER, SEND-OUT TO REF LAB)    EKG None  Radiology Dg Chest Port 1 View  Result Date: 05/11/2019 CLINICAL DATA:  28 year old female with a history of shortness of breath EXAM: PORTABLE CHEST 1 VIEW COMPARISON:  None. FINDINGS: The heart size and mediastinal contours are within normal limits. Both lungs are clear. The visualized skeletal structures are unremarkable. IMPRESSION: Negative for acute cardiopulmonary disease Electronically Signed   By: Gilmer MorJaime  Wagner D.O.   On: 05/11/2019 12:36    Procedures Procedures (including critical care time)  Medications Ordered in ED Medications  fluorescein ophthalmic strip 1 strip (1 strip Left Eye Given 05/11/19 1158)  tetracaine (PONTOCAINE) 0.5 % ophthalmic solution 2 drop (2 drops Left Eye Given 05/11/19 1158)     Initial Impression / Assessment and Plan / ED Course  I have reviewed the triage vital signs and the nursing notes.  Pertinent labs & imaging results that were available during my care of the patient were reviewed by me and considered in my medical decision making (see chart for details).        Patient here for evaluation of itching and pain to the left I as well as symptoms of shortness of breath, absent taste and smell. She is non-toxic appearing on evaluation with no respiratory distress. Chest x-ray is negative for pneumonia. Patient is not consistent with sepsis, PE, acute angle closure glaucoma, temporal arteritis,  cellulitis. In terms of her conjunctivitis, favor allergic origin. Recommend continuing her erythromycin ointment, she may start over-the-counter antihistamines eyedrops. In terms of her shortness of breath, absent taste and smell, concern for possible COVID-19 infection. Will send swab. Discussed with patient home isolation as well as return precautions.  Final Clinical Impressions(s) / ED Diagnoses   Final diagnoses:  Acute conjunctivitis of left eye, unspecified acute conjunctivitis type  Suspected Covid-19 Virus Infection    ED Discharge Orders    None       Tilden Fossaees, Bobbi Kozakiewicz, MD 05/11/19 1306

## 2019-05-11 NOTE — ED Triage Notes (Signed)
Pt c/o taking medications for pink eye since Sunday but still having pain, swelling, and drainage.

## 2019-05-12 LAB — NOVEL CORONAVIRUS, NAA (HOSP ORDER, SEND-OUT TO REF LAB; TAT 18-24 HRS): SARS-CoV-2, NAA: NOT DETECTED

## 2019-06-28 ENCOUNTER — Ambulatory Visit: Payer: 59 | Admitting: Family Medicine

## 2019-06-28 ENCOUNTER — Other Ambulatory Visit: Payer: Self-pay

## 2019-06-28 VITALS — BP 108/64 | Ht 65.0 in | Wt 174.0 lb

## 2019-06-28 DIAGNOSIS — M25552 Pain in left hip: Secondary | ICD-10-CM | POA: Diagnosis not present

## 2019-06-28 DIAGNOSIS — M797 Fibromyalgia: Secondary | ICD-10-CM | POA: Diagnosis not present

## 2019-06-28 DIAGNOSIS — M25551 Pain in right hip: Secondary | ICD-10-CM

## 2019-06-28 NOTE — Patient Instructions (Signed)
You have fibromyalgia and snapping hip syndrome. Your exam is otherwise reassuring. Physical therapy is the most important part of treatment - even if you only go once a week or once every other week and do home exercises other times this would help a lot. Light cardio is beneficial - cycling, swimming, walking for exercise has been shown to reduce pain scores. Lyrica has been shown to help as well if the cymbalta isn't covered by your insurance. Follow up with me in 6 weeks for reevaluation.

## 2019-06-30 ENCOUNTER — Encounter: Payer: Self-pay | Admitting: Family Medicine

## 2019-06-30 NOTE — Progress Notes (Signed)
PCP: Rory Percy, DO  Subjective:   HPI: Patient is a 28 y.o. female here for multiple issues.  08/18/18: Patient is reporting at least 4-6 months of pain in her low back bilaterally that radiates down both legs. Also with bilateral posterior shoulder pain for 4-49months at 7/10 level. Associated tingling, numbness down both arms. States difficulty extending her elbows. Both shoulders feel weak. Has 6/10 level of pain in low back. Reports at times has had swelling, redness of her wrists and knees. Pain goes throughout body. No bowel/bladder dysfunction.  06/28/19: Patient reports she continues to struggle with pain issues. Has seen rheumatology, dx with myofascial pain. She's taking prenatal vitamin and vitamin D.  Tried low dose gabapentin, cymbalta but did not tolerate. Reports pain in low back, both hips though right is worse. Stuck sensation of right hip where she has to stop in position for several seconds. Pulling sensation posterior left knee. And a burning localized pain to neck and spine. No skin changes.  Past Medical History:  Diagnosis Date  . Anemia 2012  . Angio-edema   . Anxiety   . Arthritis   . Asthma 2009   last used inhaler 2009  . Depression   . H/O varicella   . 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 History of gonorrhea   . Hx of chlamydia infection   . Hyperventilation   . Migraine 2010  . PID (pelvic inflammatory disease) 2012   GC and chlamydia   . Urticaria     Current Outpatient Medications on File Prior to Visit  Medication Sig Dispense Refill  . Albuterol Sulfate, sensor, 108 (90 Base) MCG/ACT AEPB Inhale 2 puffs into the lungs every 4 (four) hours as needed. (Patient taking differently: Inhale 2 puffs into the lungs every 4 (four) hours as needed (shortness of breath/wheezing). ) 1 each 1  . AUVI-Q 0.3 MG/0.3ML SOAJ injection Inject 0.3 mLs (0.3 mg total) into the muscle as needed for anaphylaxis. 2 Device 1  . DULoxetine  (CYMBALTA) 30 MG capsule Take 1 capsule (30 mg total) by mouth daily. For one week, then increase to 2 pills (60mg ) daily. (Patient not taking: Reported on 05/11/2019) 60 capsule 0  . erythromycin ophthalmic ointment Place 1 application into both eyes 4 (four) times daily.    . ferrous sulfate 325 (65 FE) MG tablet Take 1 tablet (325 mg total) by mouth 2 (two) times daily with a meal. 60 tablet 3  . mometasone (NASONEX) 50 MCG/ACT nasal spray Place 2 sprays into the nose daily as needed (congestion).    . montelukast (SINGULAIR) 10 MG tablet Take 1 tablet (10 mg total) by mouth at bedtime. 30 tablet 5  . Multiple Vitamins-Minerals (MULTIVITAMIN PO) Take 1 tablet by mouth daily.     . Polyvinyl Alcohol-Povidone PF (REFRESH) 1.4-0.6 % SOLN Apply 1 drop to eye as needed (dry eyes).    . Prenat-FeCbn-FeAspGl-FA-Omega (OB COMPLETE PETITE) 35-5-1-200 MG CAPS Take 1 capsule by mouth daily.    . Vitamin D, Ergocalciferol, (DRISDOL) 1.25 MG (50000 UT) CAPS capsule Take 1 capsule (50,000 Units total) by mouth 2 (two) times a week. 24 capsule 0   No current facility-administered medications on file prior to visit.     History reviewed. No pertinent surgical history.  Allergies  Allergen Reactions  . Latex Hives    Social History   Socioeconomic History  . Marital status: Single    Spouse name: Not on file  . Number of children: 1  .  Years of education: 7216  . Highest education level: Bachelor's degree (e.g., BA, AB, BS)  Occupational History    Comment: [employer]  Social Needs  . Financial resource strain: Not on file  . Food insecurity    Worry: Not on file    Inability: Not on file  . Transportation needs    Medical: Not on file    Non-medical: Not on file  Tobacco Use  . Smoking status: Former Smoker    Types: Cigarettes    Quit date: 10/21/2013    Years since quitting: 5.6  . Smokeless tobacco: Never Used  . Tobacco comment: quit yrs ago - OCC FOR 1 YEAR  Substance and  Sexual Activity  . Alcohol use: Not Currently  . Drug use: Not Currently    Types: Marijuana  . Sexual activity: Not on file  Lifestyle  . Physical activity    Days per week: Not on file    Minutes per session: Not on file  . Stress: Not on file  Relationships  . Social Musicianconnections    Talks on phone: Not on file    Gets together: Not on file    Attends religious service: Not on file    Active member of club or organization: Not on file    Attends meetings of clubs or organizations: Not on file    Relationship status: Not on file  . Intimate partner violence    Fear of current or ex partner: Not on file    Emotionally abused: Not on file    Physically abused: Not on file    Forced sexual activity: Not on file  Other Topics Concern  . Not on file  Social History Narrative   Lives with family, 2 old dgtr 09/2018    Own apartment.    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 Hyperlipidemia Mother   . Hypertension Mother   . Kidney disease Mother   . Hypertension Father   . Hypertension Brother   . Hyperlipidemia Brother   . Diabetes Maternal Grandmother   . Diabetes Maternal Grandfather   . Cancer Maternal Aunt 58       Ovarian CA    BP 108/64   Ht 5\' 5"  (1.651 m)   Wt 174 lb (78.9 kg)   BMI 28.96 kg/m   Review of Systems: See HPI above.     Objective:  Physical Exam:  Gen: NAD, comfortable in exam room  Back: No gross deformity, scoliosis. Paraspinal lumbar TTP throughout.  No midline or bony TTP. FROM. Strength LEs 5/5 all muscle groups.   Negative SLRs. Sensation intact to light touch bilaterally.  Bilateral hips: No deformity. FROM with 5/5 strength. Tenderness to palpation anteriorly over iliopsoas - no other tenderness. NVI distally. Negative logroll bilaterally.  Neck: No gross deformity, swelling, bruising. TTP cervical paraspinal region.  No midline/bony TTP. FROM. BUE strength 5/5.    Sensation intact to light touch.   NV intact distal BUEs.  Bilateral shoulders: No swelling, ecchymoses.  No gross deformity. TTP diffusely. FROM. Negative Hawkins, Neers. Strength 5/5 with empty can and resisted internal/external rotation. NV intact distally.  Assessment & Plan:  1. Fibromyalgia - Patient's exam is reassuring.  Has seen rheumatology for evaluation and no evidence inflammatory arthropathy or myopathy.  Consistent with fibromyalgia though also describing snapping hip syndrome.  Encouraged regular exercise and physical therapy.  Consider  lyrica or a TCA since cymbalta wasn't covered.  F/u in 6 weeks with us but advised to keep regular f/u with PCP for management.

## 2019-08-09 ENCOUNTER — Ambulatory Visit: Payer: Managed Care, Other (non HMO) | Admitting: Family Medicine

## 2019-08-10 ENCOUNTER — Other Ambulatory Visit: Payer: Self-pay

## 2019-08-10 DIAGNOSIS — M797 Fibromyalgia: Secondary | ICD-10-CM

## 2019-08-10 DIAGNOSIS — M25551 Pain in right hip: Secondary | ICD-10-CM

## 2019-08-11 ENCOUNTER — Ambulatory Visit: Payer: Managed Care, Other (non HMO) | Admitting: Family Medicine

## 2019-11-29 ENCOUNTER — Other Ambulatory Visit: Payer: Self-pay | Admitting: *Deleted

## 2019-11-29 MED ORDER — ACYCLOVIR 5 % EX OINT: 1.0000 "application " | TOPICAL_OINTMENT | CUTANEOUS | 1 refills | Status: DC

## 2019-12-01 ENCOUNTER — Other Ambulatory Visit: Payer: Self-pay | Admitting: Family Medicine

## 2019-12-01 ENCOUNTER — Encounter: Payer: Self-pay | Admitting: Family Medicine

## 2019-12-02 ENCOUNTER — Other Ambulatory Visit: Payer: Self-pay | Admitting: Family Medicine

## 2019-12-02 MED ORDER — ACYCLOVIR 5 % EX CREA: 1.0000 "application " | TOPICAL_CREAM | CUTANEOUS | 0 refills | Status: DC

## 2019-12-20 ENCOUNTER — Ambulatory Visit: Payer: Medicaid Other | Admitting: Obstetrics & Gynecology

## 2019-12-21 ENCOUNTER — Ambulatory Visit: Payer: Medicaid Other | Attending: Internal Medicine

## 2019-12-21 DIAGNOSIS — Z20822 Contact with and (suspected) exposure to covid-19: Secondary | ICD-10-CM

## 2019-12-22 ENCOUNTER — Other Ambulatory Visit: Payer: Self-pay | Admitting: Family Medicine

## 2019-12-22 LAB — NOVEL CORONAVIRUS, NAA: SARS-CoV-2, NAA: NOT DETECTED

## 2019-12-22 NOTE — Telephone Encounter (Signed)
Zovarax is on backorder per pharmacy.  Requesting a change.  Christen Bame, CMA

## 2019-12-24 NOTE — Telephone Encounter (Signed)
I sent acyclovir cream in, let me know if this does not work.

## 2019-12-27 ENCOUNTER — Other Ambulatory Visit: Payer: Self-pay | Admitting: Family Medicine

## 2019-12-27 NOTE — Telephone Encounter (Signed)
Patient has enough to last her for this current outbreak. She does not want the pills. She should be ok to use what she has until they get more ointment/cream in.

## 2019-12-29 ENCOUNTER — Ambulatory Visit (INDEPENDENT_AMBULATORY_CARE_PROVIDER_SITE_OTHER): Payer: Medicaid Other | Admitting: Certified Nurse Midwife

## 2019-12-29 ENCOUNTER — Encounter: Payer: Self-pay | Admitting: Certified Nurse Midwife

## 2019-12-29 ENCOUNTER — Other Ambulatory Visit (HOSPITAL_COMMUNITY)
Admission: RE | Admit: 2019-12-29 | Discharge: 2019-12-29 | Disposition: A | Discharge status: Home / Self Care | Payer: Medicaid Other | Source: Ambulatory Visit | Attending: Certified Nurse Midwife | Admitting: Certified Nurse Midwife

## 2019-12-29 ENCOUNTER — Other Ambulatory Visit: Payer: Self-pay

## 2019-12-29 VITALS — BP 111/73 | HR 78 | Wt 156.0 lb

## 2019-12-29 DIAGNOSIS — Z01419 Encounter for gynecological examination (general) (routine) without abnormal findings: Secondary | ICD-10-CM

## 2019-12-29 DIAGNOSIS — Z0000 Encounter for general adult medical examination without abnormal findings: Secondary | ICD-10-CM | POA: Diagnosis not present

## 2019-12-29 DIAGNOSIS — B9689 Other specified bacterial agents as the cause of diseases classified elsewhere: Secondary | ICD-10-CM

## 2019-12-29 DIAGNOSIS — Z3200 Encounter for pregnancy test, result unknown: Secondary | ICD-10-CM

## 2019-12-29 DIAGNOSIS — Z113 Encounter for screening for infections with a predominantly sexual mode of transmission: Secondary | ICD-10-CM

## 2019-12-29 DIAGNOSIS — R102 Pelvic and perineal pain: Secondary | ICD-10-CM

## 2019-12-29 MED ORDER — LEVONORGESTREL 1.5 MG PO TABS: 1.5000 mg | ORAL_TABLET | Freq: Once | ORAL | 0 refills | Status: AC

## 2019-12-29 NOTE — Addendum Note (Signed)
Addended by: Thom Chimes on: 12/29/2019 04:07 PM   Modules accepted: Orders

## 2019-12-29 NOTE — Patient Instructions (Signed)
Endometriosis  Endometriosis is a condition in which the tissue that lines the uterus (endometrium) grows outside of its normal location. The tissue may grow in many locations close to the uterus, but it commonly grows on the ovaries, fallopian tubes, vagina, or bowel. When the uterus sheds the endometrium every menstrual cycle, there is bleeding wherever the endometrial tissue is located. This can cause pain because blood is irritating to tissues that are not normally exposed to it. What are the causes? The cause of endometriosis is not known. What increases the risk? You may be more likely to develop endometriosis if you:  Have a family history of endometriosis.  Have never given birth.  Started your period at age 10 or younger.  Have high levels of estrogen in your body.  Were exposed to a certain medicine (diethylstilbestrol) before you were born (in utero).  Had low birth weight.  Were born as a twin, triplet, or other multiple.  Have a BMI of less than 25. BMI is an estimate of body fat and is calculated from height and weight. What are the signs or symptoms? Often, there are no symptoms of this condition. If you do have symptoms, they may:  Vary depending on where your endometrial tissue is growing.  Occur during your menstrual period (most common) or midcycle.  Come and go, or you may go months with no symptoms at all.  Stop with menopause. Symptoms may include:  Pain in the back or abdomen.  Heavier bleeding during periods.  Pain during sex.  Painful bowel movements.  Infertility.  Pelvic pain.  Bleeding more than once a month. How is this diagnosed? This condition is diagnosed based on your symptoms and a physical exam. You may have tests, such as:  Blood tests and urine tests. These may be done to help rule out other possible causes of your symptoms.  Ultrasound, to look for abnormal tissues.  An X-ray of the lower bowel (barium enema).  An  ultrasound that is done through the vagina (transvaginally).  CT scan.  MRI.  Laparoscopy. In this procedure, a lighted, pencil-sized instrument called a laparoscope is inserted into your abdomen through an incision. The laparoscope allows your health care provider to look at the organs inside your body and check for abnormal tissue to confirm the diagnosis. If abnormal tissue is found, your health care provider may remove a small piece of tissue (biopsy) to be examined under a microscope. How is this treated? Treatment for this condition may include:  Medicines to relieve pain, such as NSAIDs.  Hormone therapy. This involves using artificial (synthetic) hormones to reduce endometrial tissue growth. Your health care provider may recommend using a hormonal form of birth control, or other medicines.  Surgery. This may be done to remove abnormal endometrial tissue. ? In some cases, tissue may be removed using a laparoscope and a laser (laparoscopic laser treatment). ? In severe cases, surgery may be done to remove the fallopian tubes, uterus, and ovaries (hysterectomy). Follow these instructions at home:  Take over-the-counter and prescription medicines only as told by your health care provider.  Do not drive or use heavy machinery while taking prescription pain medicine.  Try to avoid activities that cause pain, including sexual activity.  Keep all follow-up visits as told by your health care provider. This is important. Contact a health care provider if:  You have pain in the area between your hip bones (pelvic area) that occurs: ? Before, during, or after your period. ?   In between your period and gets worse during your period. ? During or after sex. ? With bowel movements or urination, especially during your period.  You have problems getting pregnant.  You have a fever. Get help right away if:  You have severe pain that does not get better with medicine.  You have severe  nausea and vomiting, or you cannot eat without vomiting.  You have pain that affects only the lower, right side of your abdomen.  You have abdominal pain that gets worse.  You have abdominal swelling.  You have blood in your stool. This information is not intended to replace advice given to you by your health care provider. Make sure you discuss any questions you have with your health care provider. Document Revised: 11/21/2017 Document Reviewed: 05/11/2016 Elsevier Patient Education  2020 Elsevier Inc.  

## 2019-12-29 NOTE — Progress Notes (Signed)
GYNECOLOGY ANNUAL PREVENTATIVE CARE ENCOUNTER NOTE  History:     Shelby Graves is a 29 y.o. G93P0000 female here for a routine annual gynecologic exam.  Current complaints: pelvic pain and back pain.   Denies abnormal vaginal bleeding, discharge,  . She reports painful intercourse and menorrhagia.  Gynecologic History Patient's last menstrual period was 12/18/2019. Contraception: none Last Pap: 2015. Results were: normal with negative HPV  Obstetric History OB History  Gravida Para Term Preterm AB Living  1 0 0 0 0 0  SAB TAB Ectopic Multiple Live Births  0 0 0 0      # Outcome Date GA Lbr Len/2nd Weight Sex Delivery Anes PTL Lv  1 Gravida             Past Medical History:  Diagnosis Date  . Anemia 2012  . Angio-edema   . Anxiety   . Arthritis   . Asthma 2009   last used inhaler 2009  . Depression   . H/O varicella   . 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 History of gonorrhea   . Hx of chlamydia infection   . Hyperventilation   . Migraine 2010  . PID (pelvic inflammatory disease) 2012   GC and chlamydia   . Urticaria     History reviewed. No pertinent surgical history.  Current Outpatient Medications on File Prior to Visit  Medication Sig Dispense Refill  . Albuterol Sulfate, sensor, 108 (90 Base) MCG/ACT AEPB Inhale 2 puffs into the lungs every 4 (four) hours as needed. (Patient taking differently: Inhale 2 puffs into the lungs every 4 (four) hours as needed (shortness of breath/wheezing). ) 1 each 1  . AUVI-Q 0.3 MG/0.3ML SOAJ injection Inject 0.3 mLs (0.3 mg total) into the muscle as needed for anaphylaxis. 2 Device 1  . DULoxetine (CYMBALTA) 30 MG capsule Take 1 capsule (30 mg total) by mouth daily. For one week, then increase to 2 pills (60mg ) daily. 60 capsule 0  . Prenat-FeCbn-FeAspGl-FA-Omega (OB COMPLETE PETITE) 35-5-1-200 MG CAPS Take 1 capsule by mouth daily.    09-17-2004 acyclovir cream (ZOVIRAX) 5 % Apply 1 application topically every 3 (three)  hours. 15 g 0  . acyclovir ointment (ZOVIRAX) 5 % Apply 1 application topically every 3 (three) hours. 15 g 1  . ferrous sulfate 325 (65 FE) MG tablet Take 1 tablet (325 mg total) by mouth 2 (two) times daily with a meal. (Patient not taking: Reported on 12/29/2019) 60 tablet 3  . mometasone (NASONEX) 50 MCG/ACT nasal spray Place 2 sprays into the nose daily as needed (congestion).    . montelukast (SINGULAIR) 10 MG tablet Take 1 tablet (10 mg total) by mouth at bedtime. 30 tablet 5  . Multiple Vitamins-Minerals (MULTIVITAMIN PO) Take 1 tablet by mouth daily.     . Polyvinyl Alcohol-Povidone PF (REFRESH) 1.4-0.6 % SOLN Apply 1 drop to eye as needed (dry eyes).    . Vitamin D, Ergocalciferol, (DRISDOL) 1.25 MG (50000 UT) CAPS capsule Take 1 capsule (50,000 Units total) by mouth 2 (two) times a week. 24 capsule 0   No current facility-administered medications on file prior to visit.    Allergies  Allergen Reactions  . Latex Hives    Social History:  reports that she quit smoking about 6 years ago. Her smoking use included cigarettes. She has never used smokeless tobacco. She reports previous alcohol use. She reports previous drug use. Drug: Marijuana.  Family History  Problem Relation Age of Onset  .  Hyperlipidemia Mother   . Hypertension Mother   . Kidney disease Mother   . Hypertension Father   . Hypertension Brother   . Hyperlipidemia Brother   . Diabetes Maternal Grandmother   . Diabetes Maternal Grandfather   . Cancer Maternal Aunt 58       Ovarian CA    The following portions of the patient's history were reviewed and updated as appropriate: allergies, current medications, past family history, past medical history, past social history, past surgical history and problem list.  Review of Systems Pertinent items noted in HPI and remainder of comprehensive ROS otherwise negative.  Physical Exam:  BP 111/73   Pulse 78   Wt 156 lb (70.8 kg)   LMP 12/18/2019   BMI 25.96 kg/m    CONSTITUTIONAL: Well-developed, well-nourished female in no acute distress.  HENT:  Normocephalic, atraumatic, External right and left ear normal. Oropharynx is clear and moist EYES: Conjunctivae and EOM are normal. Pupils are equal, round, and reactive to light. NECK: Normal range of motion, supple, no masses.  Normal thyroid.  SKIN: Skin is warm and dry. No rash noted. Not diaphoretic. No erythema. No pallor. MUSCULOSKELETAL: Normal range of motion. No tenderness.  No cyanosis, clubbing, or edema.  2+ distal pulses. NEUROLOGIC: Alert and oriented to person, place, and time. Normal reflexes, muscle tone coordination.  PSYCHIATRIC: Normal mood and affect. Normal behavior. Normal judgment and thought content. CARDIOVASCULAR: Normal heart rate noted, regular rhythm RESPIRATORY: Clear to auscultation bilaterally. Effort and breath sounds normal, no problems with respiration noted. BREASTS: Symmetric in size. No masses, tenderness, skin changes, nipple drainage, or lymphadenopathy bilaterally. ABDOMEN: Soft, no distention noted.  No rebound. Guarding and tenderness in the lower aspect of abdomen.  PELVIC: Normal appearing external genitalia and urethral meatus; normal appearing vaginal mucosa and cervix.  No abnormal discharge noted.  Pap smear obtained.  Normal uterine size, no other palpable masses, uterine and bilateral adnexal tenderness- no palpable masses of cyst.    Assessment and Plan:    1. Women's annual routine gynecological examination - Abnormal well woman examination d/t pelvic pain and tenderness  - Pap obtained today  - Fam hx of hypothyroidism - will obtain TSH  - Cytology - PAP( [place]) - TSH  2. Screening for STDs (sexually transmitted diseases) - Patient request screening for STDs - HIV antibody - RPR - Hepatitis C Antibody - Hepatitis B Surface AntiGEN  3. Pelvic pain in female - patient reports pelvic pain and back pain since delivery in 2018 - reports painful  intercourse and cycles  - She reports pain has worsened over the past 2-3 months where it is tender and painful  - Has not been diagnosed with endometriosis in past and has not been on birth control in 7 years  - She reports Dr at time of delivery noted if she has C/S they could check for endometriosis  - Endometriosis vs Fibromyalgia  - Will obtain US to assess for abnormalities such as cyst and fibroids  - Will refer patient to GYN physical therapy to increase pelvic floor strength  - Plan to follow up in 2-3 weeks after [place] to discussed management of possible endometriosis  - Ambulatory referral to Physical Therapy - US Pelvis Complete; Future  4. Possible pregnancy - Patient reports recent IC 2-3 times in the past 48 hours, is currently not on birth control but does not want to start birth control until she knows "what is going on with her body" -  Does not want to be pregnant and request Plan B - discussed with patient plan b is not birth control and can not use it as such - patient verbalizes understanding  - Will sent emergency contraceptive to pharmacy - instructed to take prior to 72 after intercourse  - levonorgestrel (PLAN B ONE-STEP) 1.5 MG tablet; Take 1 tablet (1.5 mg total) by mouth once for 1 dose.  Dispense: 1 tablet; Refill: 0  Will follow up results of pap smear/STD screening and manage accordingly. Routine preventative health maintenance measures emphasized. Please refer to After Visit Summary for other counseling recommendations.       Sharyon Cable, CNM [facility] [employer], [facility]

## 2019-12-30 LAB — CERVICOVAGINAL ANCILLARY ONLY
Bacterial Vaginitis (gardnerella): POSITIVE — AB
Candida Glabrata: NEGATIVE
Candida Vaginitis: NEGATIVE
Chlamydia: NEGATIVE
Comment: NEGATIVE
Comment: NEGATIVE
Comment: NEGATIVE
Comment: NEGATIVE
Comment: NEGATIVE
Comment: NORMAL
Neisseria Gonorrhea: NEGATIVE
Trichomonas: NEGATIVE

## 2019-12-30 LAB — CYTOLOGY - PAP
Chlamydia: NEGATIVE
Comment: NEGATIVE
Comment: NEGATIVE
Comment: NORMAL
Diagnosis: NEGATIVE
Neisseria Gonorrhea: NEGATIVE
Trichomonas: NEGATIVE

## 2019-12-30 LAB — RPR: RPR Ser Ql: NONREACTIVE

## 2019-12-30 LAB — HEPATITIS B SURFACE ANTIGEN: Hepatitis B Surface Ag: NEGATIVE

## 2019-12-30 LAB — HEPATITIS C ANTIBODY: Hep C Virus Ab: <0.1 s/co ratio (ref 0.0–0.9)

## 2019-12-30 LAB — HIV ANTIBODY (ROUTINE TESTING W REFLEX): HIV Screen 4th Generation wRfx: NONREACTIVE

## 2019-12-30 LAB — TSH: TSH: 1.44 u[IU]/mL (ref 0.450–4.500)

## 2020-01-04 ENCOUNTER — Ambulatory Visit: Payer: Medicaid Other | Attending: Certified Nurse Midwife | Admitting: Physical Therapy

## 2020-01-04 ENCOUNTER — Other Ambulatory Visit: Payer: Self-pay

## 2020-01-04 ENCOUNTER — Encounter: Payer: Self-pay | Admitting: Physical Therapy

## 2020-01-04 DIAGNOSIS — M6281 Muscle weakness (generalized): Secondary | ICD-10-CM | POA: Diagnosis not present

## 2020-01-04 DIAGNOSIS — R279 Unspecified lack of coordination: Secondary | ICD-10-CM | POA: Insufficient documentation

## 2020-01-04 DIAGNOSIS — M545 Low back pain, unspecified: Secondary | ICD-10-CM

## 2020-01-04 DIAGNOSIS — G8929 Other chronic pain: Secondary | ICD-10-CM | POA: Diagnosis present

## 2020-01-05 MED ORDER — METRONIDAZOLE 500 MG PO TABS: 500.0000 mg | ORAL_TABLET | Freq: Two times a day (BID) | ORAL | 0 refills | Status: DC

## 2020-01-05 NOTE — Addendum Note (Signed)
Addended by: Sharyon Cable on: 01/05/2020 10:33 AM   Modules accepted: Orders

## 2020-01-06 NOTE — Addendum Note (Signed)
Addended by: Beatris Si on: 01/06/2020 05:35 PM   Modules accepted: Orders

## 2020-01-06 NOTE — Therapy (Addendum)
[facility] [address] [place], [place], [address] Phone: [phone]   Fax:  [phone]  Physical Therapy Evaluation  Patient Details  Name: Shelby Graves MRN: [id number] Date of Birth: 06-28-1991 Referring Provider (PT): Sharyon Cable, CNM   Encounter Date: 01/04/2020  PT End of Session - 01/06/20 1621    Visit Number  1    Date for PT Re-Evaluation  04/25/20    Authorization Type  mcaid    PT Start Time  1011    PT Stop Time  1054    PT Time Calculation (min)  43 min    Activity Tolerance  Patient limited by pain    Behavior During Therapy  [facility] for tasks assessed/performed       Past Medical History:  Diagnosis Date  . Anemia 2012  . Angio-edema   . Anxiety   . Arthritis   . Asthma 2009   last used inhaler 2009  . Depression   . H/O varicella   . 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 History of gonorrhea   . Hx of chlamydia infection   . Hyperventilation   . Migraine 2010  . PID (pelvic inflammatory disease) 2012   GC and chlamydia   . Urticaria     History reviewed. No pertinent surgical history.  There were no vitals filed for this visit.   Subjective Assessment - 01/06/20 1622    Subjective  Pt states she has been having back and hip pain.  Pt states her Rt hip sometimes.  Pt states she was sitting yesterday and could not get up.  Pt states the hip locking started 2018 and noticed going up the steps and sitting for a long time aggravates this.  Pt states she has sciatic pain and numbness on both sides throughout both LE down to feet.    Pertinent History  fibromyalgia    Patient Stated Goals  decrease pain to be able to work and care for her child    Currently in Pain?  Yes    Pain Score  5     Pain Location  Hip    Pain Orientation  Right;Lateral    Pain Descriptors / Indicators  Tightness;Numbness    Pain Type  Chronic pain    Pain Radiating Towards  numb down the left  leg and numb in the ankles    Pain Onset  More than a month ago    Pain Frequency  Constant    Aggravating Factors   sitting a long time, working as a server    Pain Relieving Factors  stretching    Effect of Pain on Daily Activities  cannot work a normal shift    Multiple Pain Sites  No         [facility] PT Assessment - 01/06/20 0001      Assessment   Medical Diagnosis  R10.2 (ICD-10-CM) - Pelvic pain in female    Referring Provider (PT)  Sharyon Cable, CNM    Onset Date/Surgical Date  --   3 years ago but worsening last 2 years, back pain chronic   Prior Therapy  Yes      Precautions   Precautions  None      Restrictions   Weight Bearing Restrictions  No      Balance Screen   Has the patient fallen in the past 6 months  No      Home Environment   Living  Environment  Private residence    Living Arrangements  Children;Parent   daughter and mother     Prior Function   Level of Independence  Independent    Vocation  Part time employment    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 [employer]   Overall Cognitive Status  Within Functional Limits for tasks assessed      Posture/Postural Control   Posture/Postural Control  Postural limitations    Postural Limitations  Right pelvic obliquity;Increased lumbar lordosis;Rounded Shoulders      ROM / Strength   AROM / PROM / Strength  PROM;Strength      PROM   Overall PROM Comments  hip IR 50% Rt side; 75% Lt side      Strength   Overall Strength Comments  Rt hip 4/5 MMT      Flexibility   Soft Tissue Assessment /Muscle Length  yes    Hamstrings  normal      Palpation   SI assessment   Rt anterior rotation    Palpation comment  adductors tight; gluteals Rt side tight and TTP; SI joint Rt side TTP      Ambulation/Gait   Gait Pattern  Within Functional Limits                Objective measurements completed on examination: See above findings.    Pelvic Floor Special Questions  - 01/06/20 0001    Prior Pelvic/Prostate Exam  Yes    Are you Pregnant or attempting pregnancy?  No    Prior Pregnancies  Yes    Number of Pregnancies  1    Number of Vaginal Deliveries  1    Urinary Leakage  No    Urinary urgency  No    Urinary frequency  normal    Pelvic Floor Internal Exam  no    Exam Type  Deferred                    PT Long Term Goals - 01/06/20 1212      PT LONG TERM GOAL #1   Title  Pt will be ind with advanced HEP    Time  16    Period  Weeks    Status  New    Target Date  04/25/20      PT LONG TERM GOAL #2   Title  Pt will demonstrate 5/5 MMT bilateral hip abduction, adduction, and extension for improved strength for ascending/descending stairs without hip pain.    Time  16    Period  Weeks    Status  New    Target Date  04/25/20      PT LONG TERM GOAL #3   Title  Pt will report at she can work a full shift due to management of pain    Baseline  can work 4 hours at most    Time  16    Period  Weeks    Status  New    Target Date  04/25/20      PT LONG TERM GOAL #4   Title  Pt will have diastatsis of rectus abdominus reduced to < 1 finger in order to stabilize pelvis and reduce incidents of knee locking up    Time  16    Period  Weeks    Status  New    Target Date  04/25/20  Plan - 01/06/20 1710    Clinical Impression Statement  Pt presents to skilled PT today due to chronic low back and hip pain that is radiating down Rt LE and worsening to include the Lt side now. Pt has tight and TTP gluteals bilat; SI joint Rt side.  Pt has LE weakness of grossly 4/5 hip strength.  pt has diastasis rectus abdominus of 2 fingers. Pt has lower abdominal pain that comes and goes, but main concern is low back pain and sciatica.  Pt will benefit from skilled PT to address these impairment and return to maximum functional activies to return to job full time and caring for her toddler    Personal Factors and Comorbidities  Comorbidity  2    Comorbidities  chronic pain; vaginal delivery    Examination-Activity Limitations  Lift;Squat;Caring for Others    Examination-Participation Restrictions  Community Activity    Stability/Clinical Decision Making  Evolving/Moderate complexity    Clinical Decision Making  Moderate    Rehab Potential  Excellent    PT Frequency  2x / week    PT Duration  --   16 weeks possible due to complexity and chronicity   PT Treatment/Interventions  ADLs/Self Care Home Management;Biofeedback;Electrical Stimulation;Iontophoresis 4mg /ml Dexamethasone;Moist Heat;Traction;Ultrasound;Therapeutic exercise;Balance training;Neuromuscular re-education;Therapeutic activities;Cryotherapy;Patient/family education;Manual techniques;Taping;Dry needling;Passive range of motion    PT Next Visit Plan  STM to lumbar and gluteals, lumbar and gluteal stretch and ROM, intial core and pelvic floor strengthening    Consulted and Agree with Plan of Care  Patient       Patient will benefit from skilled therapeutic intervention in order to improve the following deficits and impairments:  Decreased range of motion, Decreased coordination, Decreased strength, Increased fascial restricitons, Pain, Postural dysfunction, Impaired tone, Increased muscle spasms  Visit Diagnosis: Muscle weakness (generalized)  Unspecified lack of coordination  Chronic low back pain, unspecified back pain laterality, unspecified whether sciatica present     Problem List Patient Active Problem List   Diagnosis Date Noted  . Other allergic rhinitis 03/29/2019  . Adverse food reaction 03/29/2019  . Lactose intolerance 03/29/2019  . Latex allergy 03/29/2019  . Shortness of breath 03/29/2019  . Allergies 02/17/2019  . Anxiety state 02/17/2019  . Right hip subluxation ([place]) 02/15/2019  . Lower abdominal pain 07/14/2018  . 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 DUB (dysfunctional uterine bleeding) 12/17/2017  . Healthcare maintenance  06/03/2017  . Axillary mass, left 01/08/2017  . 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 Chronic back pain 12/23/2014  . Mood disorder ([place]) 12/02/2012  . IDA (iron deficiency anemia) 07/16/2012  . Vitamin D deficiency 07/16/2012    Jule Ser, PT 01/06/2020, 5:28 PM  [facility] [address] [place], [place], [address] Phone: [phone]   Fax:  [phone]  Name: Ailani Governale MRN: [id number] Date of Birth: 02/27/1991

## 2020-01-11 ENCOUNTER — Ambulatory Visit: Payer: Medicaid Other | Admitting: Physical Therapy

## 2020-01-11 ENCOUNTER — Ambulatory Visit (HOSPITAL_COMMUNITY)
Admission: RE | Admit: 2020-01-11 | Discharge: 2020-01-11 | Disposition: A | Discharge status: Home / Self Care | Payer: Medicaid Other | Source: Ambulatory Visit | Attending: Certified Nurse Midwife | Admitting: Certified Nurse Midwife

## 2020-01-11 ENCOUNTER — Other Ambulatory Visit: Payer: Self-pay

## 2020-01-11 DIAGNOSIS — R102 Pelvic and perineal pain: Secondary | ICD-10-CM | POA: Diagnosis present

## 2020-01-12 ENCOUNTER — Ambulatory Visit: Payer: Medicaid Other | Admitting: Physical Therapy

## 2020-01-12 ENCOUNTER — Encounter: Payer: Self-pay | Admitting: Physical Therapy

## 2020-01-12 DIAGNOSIS — M545 Low back pain, unspecified: Secondary | ICD-10-CM

## 2020-01-12 DIAGNOSIS — R279 Unspecified lack of coordination: Secondary | ICD-10-CM

## 2020-01-12 DIAGNOSIS — G8929 Other chronic pain: Secondary | ICD-10-CM

## 2020-01-12 DIAGNOSIS — M6281 Muscle weakness (generalized): Secondary | ICD-10-CM | POA: Diagnosis not present

## 2020-01-12 NOTE — Patient Instructions (Signed)
Access Code: [email or web address]  URL: [email or web address]  Date: 01/12/2020  Prepared by: Dwana Curd   Exercises Supine Hip Internal and External Rotation - 10 reps - 1 sets - 5 sec hold - 1x daily - 7x weekly Hooklying Transversus Abdominis Palpation - 10 reps - 3 sets - 1x daily - 7x weekly

## 2020-01-12 NOTE — Therapy (Signed)
[facility] [address], [place], [place], [address] Phone: [phone]   Fax:  [phone]  Physical Therapy Treatment  Patient Details  Name: Shelby Graves MRN: [id number] Date of Birth: June 05, 1991 Referring Provider (PT): Lajean Manes, CNM   Encounter Date: 01/12/2020  PT End of Session - 01/12/20 1452    Visit Number  2    Date for PT Re-Evaluation  04/25/20    Authorization Type  mcaid    Authorization - Visit Number  1    Authorization - Number of Visits  3    PT Start Time  1017    PT Stop Time  1527    PT Time Calculation (min)  40 min    Activity Tolerance  Patient limited by pain    Behavior During Therapy  [facility] for tasks assessed/performed       Past Medical History:  Diagnosis Date  . Anemia 2012  . Angio-edema   . Anxiety   . Arthritis   . Asthma 2009   last used inhaler 2009  . Depression   . H/O varicella   . 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 History of gonorrhea   . Hx of chlamydia infection   . Hyperventilation   . Migraine 2010  . PID (pelvic inflammatory disease) 2012   GC and chlamydia   . Urticaria     History reviewed. No pertinent surgical history.  There were no vitals filed for this visit.  Subjective Assessment - 01/12/20 1449    Subjective  I haven't done much today other than walking in the mall so my back is a little sore    Pertinent History  fibromyalgia    Patient Stated Goals  decrease pain to be able to work and care for her child    Currently in Pain?  Yes    Pain Score  5     Pain Location  Back    Pain Orientation  Right;Left    Pain Type  Chronic pain    Pain Radiating Towards  down the legs and ankle    Pain Onset  More than a month ago    Pain Frequency  Constant    Pain Relieving Factors  got a massage and that helped a little yesterday    Multiple Pain Sites  No                       [facility] Adult PT  Treatment/Exercise - 01/12/20 0001      Neuro Re-ed    Neuro Re-ed Details   TrA activation with tactile and verbal cues in supine - LE marching      Exercises   Exercises  Lumbar      Lumbar Exercises: Stretches   Active Hamstring Stretch  Right;Left;1 rep;30 seconds    Press Ups  10 seconds;5 reps      Lumbar Exercises: Prone   Straight Leg Raise  10 reps      Manual Therapy   Manual Therapy  Muscle Energy Technique             PT Education - 01/12/20 1527    Education Details  Access Code: [email or web address]    Person(s) Educated  Patient    Methods  Explanation;Demonstration;Handout;Verbal cues    Comprehension  Verbalized understanding;Returned demonstration          PT Long Term Goals - 01/06/20 1212  PT LONG TERM GOAL #1   Title  Pt will be ind with advanced HEP    Time  16    Period  Weeks    Status  New    Target Date  04/25/20      PT LONG TERM GOAL #2   Title  Pt will demonstrate 5/5 MMT bilateral hip abduction, adduction, and extension for improved strength for ascending/descending stairs without hip pain.    Time  16    Period  Weeks    Status  New    Target Date  04/25/20      PT LONG TERM GOAL #3   Title  Pt will report at she can work a full shift due to management of pain    Baseline  can work 4 hours at most    Time  16    Period  Weeks    Status  New    Target Date  04/25/20      PT LONG TERM GOAL #4   Title  Pt will have diastatsis of rectus abdominus reduced to < 1 finger in order to stabilize pelvis and reduce incidents of knee locking up    Time  16    Period  Weeks    Status  New    Target Date  04/25/20            Plan - 01/12/20 1512    Clinical Impression Statement  Pt had good response with MET to correct Lt anterior rotation corrected.  Pt was able to get more Rt hip flexion after doing MET.  Pt was able to perform TrA contraction after getting verbal and tactile cues.  She was shown how to find these muscle in order  to facilitate increased activity of muscles for greater stabilization of trunk.  Pt will benefit from skilled PT to progress strength for improved function and reduced pain.    PT Treatment/Interventions  ADLs/Self Care Home Management;Biofeedback;Electrical Stimulation;Iontophoresis 65m/ml Dexamethasone;Moist Heat;Traction;Ultrasound;Therapeutic exercise;Balance training;Neuromuscular re-education;Therapeutic activities;Cryotherapy;Patient/family education;Manual techniques;Taping;Dry needling;Passive range of motion    PT Next Visit Plan  STM to lumbar and gluteals, lumbar and gluteal stretch and ROM, f/u on intial core and pelvic floor strengthening    PT Home Exercise Plan  Access Code: [email or web address]    Consulted and Agree with Plan of Care  Patient       Patient will benefit from skilled therapeutic intervention in order to improve the following deficits and impairments:  Decreased range of motion, Decreased coordination, Decreased strength, Increased fascial restricitons, Pain, Postural dysfunction, Impaired tone, Increased muscle spasms  Visit Diagnosis: Muscle weakness (generalized)  Unspecified lack of coordination  Chronic low back pain, unspecified back pain laterality, unspecified whether sciatica present     Problem List Patient Active Problem List   Diagnosis Date Noted  . Other allergic rhinitis 03/29/2019  . Adverse food reaction 03/29/2019  . Lactose intolerance 03/29/2019  . Latex allergy 03/29/2019  . Shortness of breath 03/29/2019  . Allergies 02/17/2019  . Anxiety state 02/17/2019  . Right hip subluxation ([place] 02/15/2019  . Lower abdominal pain 07/14/2018  . 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 DUB (dysfunctional uterine bleeding) 12/17/2017  . Healthcare maintenance 06/03/2017  . Axillary mass, left 01/08/2017  . 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 Chronic back pain 12/23/2014  . Mood disorder ([facility] 12/02/2012  . IDA (iron deficiency anemia)  07/16/2012  . Vitamin D deficiency 07/16/2012    JJule Graves PT 01/12/2020,  4:05 PM  [facility] [address], [place], [place], [address] Phone: [phone]   Fax:  [phone]  Name: Shelby Graves MRN: [id number] Date of Birth: [birth year]

## 2020-01-19 ENCOUNTER — Ambulatory Visit: Payer: Medicaid Other | Admitting: Physical Therapy

## 2020-01-19 ENCOUNTER — Ambulatory Visit (INDEPENDENT_AMBULATORY_CARE_PROVIDER_SITE_OTHER): Payer: Medicaid Other | Admitting: Certified Nurse Midwife

## 2020-01-19 ENCOUNTER — Encounter: Payer: Self-pay | Admitting: Certified Nurse Midwife

## 2020-01-19 ENCOUNTER — Other Ambulatory Visit: Payer: Self-pay

## 2020-01-19 VITALS — BP 107/73 | HR 81 | Wt 156.2 lb

## 2020-01-19 DIAGNOSIS — R279 Unspecified lack of coordination: Secondary | ICD-10-CM

## 2020-01-19 DIAGNOSIS — M6281 Muscle weakness (generalized): Secondary | ICD-10-CM | POA: Diagnosis not present

## 2020-01-19 DIAGNOSIS — R102 Pelvic and perineal pain: Secondary | ICD-10-CM | POA: Diagnosis not present

## 2020-01-19 DIAGNOSIS — R9389 Abnormal findings on diagnostic imaging of other specified body structures: Secondary | ICD-10-CM

## 2020-01-19 DIAGNOSIS — G8929 Other chronic pain: Secondary | ICD-10-CM

## 2020-01-19 NOTE — Progress Notes (Signed)
Pt is here for f/u after [place] on 01/11/20.Pt reports that she has been experiencing pelvic pain for years. She reports that she has "scar tissue on her tubes" and she was in pain during her pregnancy. Pt has hx of PID.

## 2020-01-19 NOTE — Patient Instructions (Signed)

## 2020-01-19 NOTE — Therapy (Signed)
[facility] [address] [place], [place], [address] Phone: [phone]   Fax:  [phone]  Physical Therapy Treatment  Patient Details  Name: Shelby Graves MRN: [id number] Date of Birth: [birth year] Referring Provider (PT): Lajean Manes, CNM   Encounter Date: 01/19/2020  PT End of Session - 01/19/20 1407    Visit Number  3    Date for PT Re-Evaluation  04/25/20    Authorization Type  mcaid    Authorization - Visit Number  2    Authorization - Number of Visits  3    PT Start Time  1229    PT Stop Time  1308    PT Time Calculation (min)  39 min    Activity Tolerance  Patient tolerated treatment well    Behavior During Therapy  [facility] for tasks assessed/performed       Past Medical History:  Diagnosis Date  . Anemia 2012  . Angio-edema   . Anxiety   . Arthritis   . Asthma 2009   last used inhaler 2009  . Depression   . H/O varicella   . 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 History of gonorrhea   . Hx of chlamydia infection   . Hyperventilation   . Migraine 2010  . PID (pelvic inflammatory disease) 2012   GC and chlamydia   . Urticaria     No past surgical history on file.  There were no vitals filed for this visit.  Subjective Assessment - 01/19/20 1231    Subjective  I didn't do much and it has been the same.  The stretches are feeling like I am tighter    Pertinent History  fibromyalgia    Patient Stated Goals  decrease pain to be able to work and care for her child    Currently in Pain?  Yes    Pain Score  5     Pain Location  Back    Pain Orientation  Right;Left    Pain Descriptors / Indicators  Tightness;Numbness    Pain Radiating Towards  down both leg into ankles and feet                       [facility] Adult PT Treatment/Exercise - 01/19/20 0001      Lumbar Exercises: Standing   Other Standing Lumbar Exercises  fwd flexion (increased some pain in knees       Lumbar Exercises: Supine   Bent Knee Raise  20 reps    Bent Knee Raise Limitations  raises and knee fallout - 20x each      Lumbar Exercises: Prone   Other Prone Lumbar Exercises  prone press up - 10x 5 sec pain central      Lumbar Exercises: Quadruped   Single Arm Raise  Right;Left;10 reps;3 seconds    Other Quadruped Lumbar Exercises  qped rocking fwd/back - 10x      Manual Therapy   Manual Therapy  Soft tissue mobilization    Soft tissue mobilization  Rt glutes and lumbar and thoracic paraspinals bilat       Trigger Point Dry Needling - 01/19/20 0001    Consent Given?  Yes    Education Handout Provided  Yes    Muscles Treated Back/Hip  Lumbar multifidi;Thoracic multifidi    Lumbar multifidi Response  Twitch response elicited;Palpable increased muscle length    Thoracic multifidi response  Twitch response elicited;Palpable increased muscle length  PT Long Term Goals - 01/19/20 1407      PT LONG TERM GOAL #1   Title  Pt will be ind with advanced HEP    Status  On-going      PT LONG TERM GOAL #2   Title  Pt will demonstrate 5/5 MMT bilateral hip abduction, adduction, and extension for improved strength for ascending/descending stairs without hip pain.    Status  On-going      PT LONG TERM GOAL #3   Title  Pt will report at she can work a full shift due to management of pain    Status  On-going      PT LONG TERM GOAL #4   Title  Pt will have diastatsis of rectus abdominus reduced to < 1 finger in order to stabilize pelvis and reduce incidents of knee locking up    Status  On-going            Plan - 01/19/20 1313    Clinical Impression Statement  Pt was monitored for pain throughout.  No increased pain and felt like she got more mobility after manual and soft tissue techniques.  Pt was able to progress strength exercises and added to HEP today.  Pt will still need skilled PT to work on pain management.    PT Treatment/Interventions   ADLs/Self Care Home Management;Biofeedback;Electrical Stimulation;Iontophoresis 4mg /ml Dexamethasone;Moist Heat;Traction;Ultrasound;Therapeutic exercise;Balance training;Neuromuscular re-education;Therapeutic activities;Cryotherapy;Patient/family education;Manual techniques;Taping;Dry needling;Passive range of motion    PT Next Visit Plan  f/u on DN and STM to lumbar and gluteals, lumbar and gluteal stretch and ROM, f/u on intial core and pelvic floor strengthening    PT Home Exercise Plan  Access Code: [email or web address]    Consulted and Agree with Plan of Care  Patient       Patient will benefit from skilled therapeutic intervention in order to improve the following deficits and impairments:  Decreased range of motion, Decreased coordination, Decreased strength, Increased fascial restricitons, Pain, Postural dysfunction, Impaired tone, Increased muscle spasms  Visit Diagnosis: Muscle weakness (generalized)  Unspecified lack of coordination  Chronic low back pain, unspecified back pain laterality, unspecified whether sciatica present     Problem List Patient Active Problem List   Diagnosis Date Noted  . Other allergic rhinitis 03/29/2019  . Adverse food reaction 03/29/2019  . Lactose intolerance 03/29/2019  . Latex allergy 03/29/2019  . Shortness of breath 03/29/2019  . Allergies 02/17/2019  . Anxiety state 02/17/2019  . Right hip subluxation (HCC) 02/15/2019  . Lower abdominal pain 07/14/2018  . 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 DUB (dysfunctional uterine bleeding) 12/17/2017  . Healthcare maintenance 06/03/2017  . Axillary mass, left 01/08/2017  . 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 Chronic back pain 12/23/2014  . Mood disorder (HCC) 12/02/2012  . IDA (iron deficiency anemia) 07/16/2012  . Vitamin D deficiency 07/16/2012    07/18/2012, PT 01/19/2020, 2:09 PM  [facility] [address], [address] [place], [place], [place] Phone: [phone]   Fax:  [phone]  Name: Sieanna Vanstone MRN: Karl Pock Date of Birth: 1991/04/20

## 2020-01-20 NOTE — Progress Notes (Signed)
History:  [name] is a 29 y.o. G1P1001 who presents to clinic today for follow up. She was seen for pelvic pain on 1/6. Pelvic US and pelvic PT was initiated after last appointment. Patient reports pelvic pain is slightly better but was worse with last cycle which only last 3-4 days.   The following portions of the patient's history were reviewed and updated as appropriate: allergies, current medications, family history, past medical history, social history, past surgical history and problem list.  Review of Systems:  Review of Systems  Constitutional: Negative.   Respiratory: Negative.   Cardiovascular: Negative.   Gastrointestinal: Negative.   Genitourinary:       Pelvic pain  Neurological: Negative.   Psychiatric/Behavioral: Negative.      Objective:  Physical Exam BP 107/73   Pulse 81   Wt 156 lb 3.2 oz (70.9 kg)   LMP 01/13/2020 (Exact Date)   BMI 25.99 kg/m  Physical Exam Cardiovascular:     Rate and Rhythm: Normal rate and regular rhythm.  Pulmonary:     Effort: Pulmonary effort is normal. No respiratory distress.     Breath sounds: Normal breath sounds. No wheezing.  Abdominal:     General: There is no distension.     Palpations: Abdomen is soft.  Skin:    General: Skin is warm.  Neurological:     Mental Status: She is alert and oriented to person, place, and time.     Labs and Imaging US Pelvis Complete  Result Date: 01/11/2020 CLINICAL DATA:  Pelvic pain in a female, tenderness, increased symptoms over past 2-3 months, prior Caesarean section; LMP 12/18/2019 EXAM: TRANSABDOMINAL ULTRASOUND OF PELVIS TECHNIQUE: Transabdominal ultrasound examination of the pelvis was performed including evaluation of the uterus, ovaries, adnexal regions, and pelvic cul-de-sac. COMPARISON:  12/17/2017 FINDINGS: Uterus Measurements: 10.6 x 5.0 x 5.9 cm = volume: 160 mL. Normal morphology without mass Endometrium Thickness: 25 mm.  No endometrial fluid or focal  abnormality Right ovary Measurements: 2.5 x 1.2 x 1.4 cm = volume: 2.1 mL. Normal morphology without mass Left ovary Measurements: 3.2 x 1.9 x 2.3 cm = volume: 7.4 mL. Normal morphology without mass Other findings:  Trace free pelvic fluid.  No adnexal masses. IMPRESSION: Thickened endometrial complex 25 mm thick. Endometrial thickness is considered abnormal. Consider follow-up by [place] in 6-8 weeks, during the week immediately following menses (exam timing is critical). Electronically Signed   By: Ulyses Southward M.D.   On: 01/11/2020 10:33    Assessment & Plan:  1. Endometrial thickening on ultrasound - Reviewed results of patient [place]  - Discussed results of ultrasound with patient and need for follow up after next cycle  - Patient reports next cycle is due to start on 2/18 but usually starts late. Instructed patient to call office if cycle starts on time due to needing to move ultrasound up because timing is important of next [place]  - US PELVIS TRANSVAGINAL NON-OB (TV ONLY); Future  2. Pelvic pain in female - Continue physical therapy as scheduled  - Follow up in 2 months after mid point of therapy and repeat US    Sharyon Cable, CNM 01/20/2020 2:30 PM

## 2020-01-25 ENCOUNTER — Encounter: Payer: Self-pay | Admitting: Physical Therapy

## 2020-01-25 ENCOUNTER — Ambulatory Visit: Payer: Medicaid Other | Attending: Certified Nurse Midwife | Admitting: Physical Therapy

## 2020-01-25 ENCOUNTER — Other Ambulatory Visit: Payer: Self-pay

## 2020-01-25 DIAGNOSIS — G8929 Other chronic pain: Secondary | ICD-10-CM | POA: Insufficient documentation

## 2020-01-25 DIAGNOSIS — M545 Low back pain, unspecified: Secondary | ICD-10-CM

## 2020-01-25 DIAGNOSIS — M6281 Muscle weakness (generalized): Secondary | ICD-10-CM | POA: Diagnosis present

## 2020-01-25 DIAGNOSIS — R279 Unspecified lack of coordination: Secondary | ICD-10-CM | POA: Diagnosis present

## 2020-01-25 NOTE — Therapy (Signed)
[facility] [address], [place], [place], [address] Phone: [phone]   Fax:  [phone]  Physical Therapy Treatment  Patient Details  Name: Shelby Graves MRN: [id number] Date of Birth: 29-Jul-1991 Referring Provider (PT): Lajean Manes, CNM   Encounter Date: 01/25/2020  PT End of Session - 01/25/20 1024    Visit Number  4    Date for PT Re-Evaluation  04/25/20    Authorization Type  mcaid    Authorization - Visit Number  3    Authorization - Number of Visits  3    PT Start Time  2122    PT Stop Time  1058    PT Time Calculation (min)  40 min    Activity Tolerance  Patient tolerated treatment well    Behavior During Therapy  [facility] for tasks assessed/performed       Past Medical History:  Diagnosis Date  . Anemia 2012  . Angio-edema   . Anxiety   . Arthritis   . Asthma 2009   last used inhaler 2009  . Depression   . H/O varicella   . 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 History of gonorrhea   . Hx of chlamydia infection   . Hyperventilation   . Migraine 2010  . PID (pelvic inflammatory disease) 2012   GC and chlamydia   . Urticaria     History reviewed. No pertinent surgical history.  There were no vitals filed for this visit.  Subjective Assessment - 01/25/20 1020    Subjective  Pt states no changes and having just a slight pain in the low back and around the hips.    Pertinent History  fibromyalgia    Patient Stated Goals  decrease pain to be able to work and care for her child    Currently in Pain?  Yes    Pain Score  5     Pain Location  Back    Pain Orientation  Right;Left    Pain Descriptors / Indicators  Radiating    Pain Type  Chronic pain    Pain Onset  More than a month ago    Pain Frequency  Constant    Multiple Pain Sites  No         [facility] PT Assessment - 01/25/20 0001      Assessment   Medical Diagnosis  R10.2 (ICD-10-CM) - Pelvic pain in female    Referring Provider (PT)  Lajean Manes, CNM      Strength   Overall Strength Comments  Rt/Lt hip abduction 4/5; Rt hip adduction 4/5; Rt hip flex and ext 5/5 - all +hip pain      Palpation   SI assessment   normal    Palpation comment  TFL tight bilateral; DRA 1.5 finger width                   [facility] Adult PT Treatment/Exercise - 01/25/20 0001      Neuro Re-ed    Neuro Re-ed Details   seated on ball - pelvic tilts and circles      Lumbar Exercises: Stretches   Standing Side Bend  Right;1 rep;Left;20 seconds    ITB Stretch  Right;Left;30 seconds      Lumbar Exercises: Sidelying   Clam  Right;Left;15 reps      Manual Therapy   Soft tissue mobilization  rectus abdominus; TFL - bilateral  PT Long Term Goals - 01/25/20 1025      PT LONG TERM GOAL #1   Title  Pt will be ind with advanced HEP    Baseline  still learning      PT LONG TERM GOAL #2   Title  Pt will demonstrate 5/5 MMT bilateral hip abduction, adduction, and extension for improved strength for ascending/descending stairs without hip pain.    Baseline  4/5 MMT      PT LONG TERM GOAL #3   Title  Pt will report at she can work a full shift due to management of pain    Baseline  can work 4 hours at most      PT [place] #4   Title  Pt will have diastatsis of rectus abdominus reduced to < 1 finger in order to stabilize pelvis and reduce incidents of hip locking up    Baseline  1.5 finger width            Plan - 01/25/20 1204    Clinical Impression Statement  Pt not having pain radiating down to ankles today and more centralized. Pt is still having pain with functional activities and limited at work.  She was educated in SI bracing with a belt and PT recommends purchase of SI belt.  She has not met any goals yet.  Pt is making some porgress, but only having 3 treatments at this time.  Pt needs skilled PT to address impairments mentioned above.  Pt is expected to  continue to make slow porgress due to comorbidities such as fibromyalgia and chronic pain.    PT Treatment/Interventions  ADLs/Self Care Home Management;Biofeedback;Electrical Stimulation;Iontophoresis 42m/ml Dexamethasone;Moist Heat;Traction;Ultrasound;Therapeutic exercise;Balance training;Neuromuscular re-education;Therapeutic activities;Cryotherapy;Patient/family education;Manual techniques;Taping;Dry needling;Passive range of motion    PT Next Visit Plan  f/u on DN and STM to lumbar and gluteals, add DN to TFL bilat; continue core and pelvic floor strengthening; f/u on SI belt    PT Home Exercise Plan  Access Code: [email or web address]       Patient will benefit from skilled therapeutic intervention in order to improve the following deficits and impairments:  Decreased range of motion, Decreased coordination, Decreased strength, Increased fascial restricitons, Pain, Postural dysfunction, Impaired tone, Increased muscle spasms  Visit Diagnosis: Muscle weakness (generalized)  Unspecified lack of coordination  Chronic low back pain, unspecified back pain laterality, unspecified whether sciatica present     Problem List Patient Active Problem List   Diagnosis Date Noted  . Other allergic rhinitis 03/29/2019  . Adverse food reaction 03/29/2019  . Lactose intolerance 03/29/2019  . Latex allergy 03/29/2019  . Shortness of breath 03/29/2019  . Allergies 02/17/2019  . Anxiety state 02/17/2019  . Right hip subluxation ([place] 02/15/2019  . Lower abdominal pain 07/14/2018  . 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 DUB (dysfunctional uterine bleeding) 12/17/2017  . Healthcare maintenance 06/03/2017  . Axillary mass, left 01/08/2017  . 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 Chronic back pain 12/23/2014  . Mood disorder ([place] 12/02/2012  . IDA (iron deficiency anemia) 07/16/2012  . Vitamin D deficiency 07/16/2012    JJule Ser PT 01/25/2020, 12:18 PM  [facility] [address] [place] [address]: [phone]  Fax:  [phone] Name: Shelby MooneyhanMRN: [id number] of Birth: 3December 21, 1992

## 2020-02-01 ENCOUNTER — Other Ambulatory Visit: Payer: Self-pay

## 2020-02-01 ENCOUNTER — Ambulatory Visit: Payer: Medicaid Other | Admitting: Physical Therapy

## 2020-02-01 DIAGNOSIS — G8929 Other chronic pain: Secondary | ICD-10-CM

## 2020-02-01 DIAGNOSIS — M545 Low back pain, unspecified: Secondary | ICD-10-CM

## 2020-02-01 DIAGNOSIS — R279 Unspecified lack of coordination: Secondary | ICD-10-CM

## 2020-02-01 DIAGNOSIS — M6281 Muscle weakness (generalized): Secondary | ICD-10-CM

## 2020-02-01 NOTE — Patient Instructions (Signed)
[email or web address]

## 2020-02-01 NOTE — Therapy (Signed)
[facility] [address], [place], [place], [address] Phone: [phone]   Fax:  [phone]  Physical Therapy Treatment  Patient Details  Name: Shelby Graves MRN: [id number] Date of Birth: 1991-09-03 Referring Provider (PT): Lajean Manes, CNM   Encounter Date: 02/01/2020  PT End of Session - 02/01/20 1446    Visit Number  5    Date for PT Re-Evaluation  04/25/20    Authorization Type  mcaid    Authorization - Visit Number  1    Authorization - Number of Visits  8    PT Start Time  1400    PT Stop Time  1444    PT Time Calculation (min)  44 min    Activity Tolerance  Patient tolerated treatment well    Behavior During Therapy  [facility] for tasks assessed/performed       Past Medical History:  Diagnosis Date  . Anemia 2012  . Angio-edema   . Anxiety   . Arthritis   . Asthma 2009   last used inhaler 2009  . Depression   . H/O varicella   . 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 History of gonorrhea   . Hx of chlamydia infection   . Hyperventilation   . Migraine 2010  . PID (pelvic inflammatory disease) 2012   GC and chlamydia   . Urticaria     No past surgical history on file.  There were no vitals filed for this visit.  Subjective Assessment - 02/01/20 1554    Subjective  Pt states the hip is a little better.  It seems like it helped last session.  Pt reports she has been having pain with intercourse.    Pertinent History  fibromyalgia    Patient Stated Goals  decrease pain to be able to work and care for her child    Currently in Pain?  Yes    Pain Score  6     Pain Location  Back    Pain Orientation  Lower    Pain Type  Chronic pain    Pain Onset  More than a month ago    Pain Frequency  Intermittent    Multiple Pain Sites  No                       [facility] Adult PT Treatment/Exercise - 02/01/20 0001      Lumbar Exercises: Standing   Functional Squats Limitations   squat with dowel behind back for keeping spine straight; tactile cues to lean back into the wall - 2x15    Other Standing Lumbar Exercises  hip abduction and adduction red band - cues to engage core; feels it in her back after becoming fatigued      Lumbar Exercises: Prone   Straight Leg Raise  10 reps      Lumbar Exercises: Quadruped   Other Quadruped Lumbar Exercises  child's pose with side bending and breathing into pelvic floor for hip opening      Manual Therapy   Soft tissue mobilization  gluteals; TFL - bilateral             PT Education - 02/01/20 1446    Education Details  [email or web address]    Person(s) Educated  Patient    Methods  Explanation;Demonstration;Tactile cues;Verbal cues;Handout    Comprehension  Verbalized understanding;Returned demonstration          PT Long Term Goals - 01/25/20 1025  PT LONG TERM GOAL #1   Title  Pt will be ind with advanced HEP    Baseline  still learning      PT LONG TERM GOAL #2   Title  Pt will demonstrate 5/5 MMT bilateral hip abduction, adduction, and extension for improved strength for ascending/descending stairs without hip pain.    Baseline  4/5 MMT      PT LONG TERM GOAL #3   Title  Pt will report at she can work a full shift due to management of pain    Baseline  can work 4 hours at most      PT LONG TERM GOAL #4   Title  Pt will have diastatsis of rectus abdominus reduced to < 1 finger in order to stabilize pelvis and reduce incidents of hip locking up    Baseline  1.5 finger width            Plan - 02/01/20 1447    Clinical Impression Statement  Pt reports she has been having the pain in her back and has been having pain with intercourse lately.  Pt needed verbal and tactile cues to engage core and relax.  Pt was educated about pelvic floor possibly being related to pain with intercourse and informed that we will assess pelvic floor at next session.  Pt had slight increase in lumbar pain with hip abduction.  Pt  had good release of soft tissues with manual soft tissue work to QL and gluteals.  Pt needed tactile and verbal cues to engage core and perform mini squats with good technique.  Pt became fatigued quickly with hip abduction and started feeling low back tension at the end of the set.  Pt will benefit from skilled PT to continue to work on core strength.    PT Treatment/Interventions  ADLs/Self Care Home Management;Biofeedback;Electrical Stimulation;Iontophoresis 4mg /ml Dexamethasone;Moist Heat;Traction;Ultrasound;Therapeutic exercise;Balance training;Neuromuscular re-education;Therapeutic activities;Cryotherapy;Patient/family education;Manual techniques;Taping;Dry needling;Passive range of motion    PT Next Visit Plan  assess pelvic floor    PT Home Exercise Plan  Access Code: [email or web address]    Consulted and Agree with Plan of Care  Patient       Patient will benefit from skilled therapeutic intervention in order to improve the following deficits and impairments:  Decreased range of motion, Decreased coordination, Decreased strength, Increased fascial restricitons, Pain, Postural dysfunction, Impaired tone, Increased muscle spasms  Visit Diagnosis: Muscle weakness (generalized)  Unspecified lack of coordination  Chronic low back pain, unspecified back pain laterality, unspecified whether sciatica present     Problem List Patient Active Problem List   Diagnosis Date Noted  . Other allergic rhinitis 03/29/2019  . Adverse food reaction 03/29/2019  . Lactose intolerance 03/29/2019  . Latex allergy 03/29/2019  . Shortness of breath 03/29/2019  . Allergies 02/17/2019  . Anxiety state 02/17/2019  . Right hip subluxation (HCC) 02/15/2019  . Lower abdominal pain 07/14/2018  . 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 DUB (dysfunctional uterine bleeding) 12/17/2017  . Healthcare maintenance 06/03/2017  . Axillary mass, left 01/08/2017  . 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 Chronic back  pain 12/23/2014  . Mood disorder (HCC) 12/02/2012  . IDA (iron deficiency anemia) 07/16/2012  . Vitamin D deficiency 07/16/2012    07/18/2012, PT 02/01/2020, 4:18 PM  [facility] [address], [address] [place], [place], [place] Phone: [phone]   Fax:  [phone]  Name: Shelby Graves MRN: Karl Pock Date of Birth: 1991-08-14

## 2020-02-04 ENCOUNTER — Other Ambulatory Visit: Payer: Self-pay

## 2020-02-04 ENCOUNTER — Ambulatory Visit: Payer: Medicaid Other | Admitting: Physical Therapy

## 2020-02-04 DIAGNOSIS — M6281 Muscle weakness (generalized): Secondary | ICD-10-CM | POA: Diagnosis not present

## 2020-02-04 DIAGNOSIS — G8929 Other chronic pain: Secondary | ICD-10-CM

## 2020-02-04 DIAGNOSIS — R279 Unspecified lack of coordination: Secondary | ICD-10-CM

## 2020-02-04 DIAGNOSIS — M545 Low back pain, unspecified: Secondary | ICD-10-CM

## 2020-02-04 NOTE — Patient Instructions (Addendum)
STRETCHING THE PELVIC FLOOR MUSCLES NO DILATOR  Supplies . Vaginal lubricant . Mirror (optional) . Gloves (optional) Positioning . Start in a semi-reclined position with your head propped up. Bend your knees and place your thumb or finger at the vaginal opening. Procedure . Apply a moderate amount of lubricant on the outer skin of your vagina, the labia minora.  Apply additional lubricant to your finger. . Spread the skin away from the vaginal opening. Place the end of your finger at the opening. . Do a maximum contraction of the pelvic floor muscles. Tighten the vagina and the anus maximally and relax. . When you know they are relaxed, gently and slowly insert your finger into your vagina, directing your finger slightly downward, for 2-3 inches of insertion. . Relax and stretch the 6 o'clock position . Hold each stretch for _2 min__ and repeat __1_ time with rest breaks of _1__ seconds between each stretch. . Repeat the stretching in the 4 o'clock and 8 o'clock positions. . Total time should be _6__ minutes, _1__ x per day.  Note the amount of theme your were able to achieve and your tolerance to your finger in your vagina. . Once you have accomplished the techniques you may try them in standing with one foot resting on the tub, or in other positions.  This is a good stretch to do in the shower if you don't need to use lubricant.   

## 2020-02-04 NOTE — Therapy (Signed)
[facility] [address] [place], [place], [address] Phone: [phone]   Fax:  [phone]  Physical Therapy Treatment  Patient Details  Name: Shelby Graves MRN: [id number] Date of Birth: 25-May-1991 Referring Provider (PT): Lajean Manes, CNM   Encounter Date: 02/04/2020  PT End of Session - 02/04/20 1203    Visit Number  6    Date for PT Re-Evaluation  04/25/20    Authorization Type  mcaid    Authorization - Visit Number  2    Authorization - Number of Visits  8    PT Start Time  1020    PT Stop Time  1100    PT Time Calculation (min)  40 min    Activity Tolerance  Patient tolerated treatment well    Behavior During Therapy  [facility] for tasks assessed/performed       Past Medical History:  Diagnosis Date  . Anemia 2012  . Angio-edema   . Anxiety   . Arthritis   . Asthma 2009   last used inhaler 2009  . Depression   . H/O varicella   . 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 History of gonorrhea   . Hx of chlamydia infection   . Hyperventilation   . Migraine 2010  . PID (pelvic inflammatory disease) 2012   GC and chlamydia   . Urticaria     No past surgical history on file.  There were no vitals filed for this visit.  Subjective Assessment - 02/04/20 1029    Subjective  Pt states she just has a little stiffness    Pertinent History  fibromyalgia    Patient Stated Goals  decrease pain to be able to work and care for her child    Currently in Pain?  Yes    Pain Score  4     Pain Location  Back    Pain Orientation  Lower    Pain Descriptors / Indicators  Radiating;Tightness    Pain Radiating Towards  into Rt thorax    Pain Onset  More than a month ago    Pain Frequency  Intermittent    Multiple Pain Sites  No                    Pelvic Floor Special Questions - 02/04/20 0001    Pelvic Floor Internal Exam  pt identity confirmed and informed consent given to perform  internal soft tissue assessment    Exam Type  Vaginal    Palpation  TTP Lt>Rt    Strength  weak squeeze, no lift    Strength # of seconds  1    Tone  mixed        [facility] Adult PT Treatment/Exercise - 02/04/20 0001      Neuro Re-ed    Neuro Re-ed Details   TC to engage pelvic floor and breathing for relaxation      Manual Therapy   Manual Therapy  Soft tissue mobilization;Myofascial release    Soft tissue mobilization  Lt adductors and ischiocavernosis    Myofascial Release  Lt ischiocavernosis and Lt perineum             PT Education - 02/04/20 1201    Education Details  self massage    Person(s) Educated  Patient    Methods  Explanation;Demonstration;Handout;Verbal cues    Comprehension  Verbalized understanding          PT Long Term Goals -  01/25/20 1025      PT LONG TERM GOAL #1   Title  Pt will be ind with advanced HEP    Baseline  still learning      PT LONG TERM GOAL #2   Title  Pt will demonstrate 5/5 MMT bilateral hip abduction, adduction, and extension for improved strength for ascending/descending stairs without hip pain.    Baseline  4/5 MMT      PT LONG TERM GOAL #3   Title  Pt will report at she can work a full shift due to management of pain    Baseline  can work 4 hours at most      PT LONG TERM GOAL #4   Title  Pt will have diastatsis of rectus abdominus reduced to < 1 finger in order to stabilize pelvis and reduce incidents of hip locking up    Baseline  1.5 finger width            Plan - 02/04/20 1213    Clinical Impression Statement  Pt assessed pelvic floor with patient consent.  Pt had decreased coordination and unable to contract and relax pelvic floor muscles on command.  Pt was bulging with attempts to contract.  She would have increased tone when attempting to relax . This appears to be due to chronic pain and having pain with intercourse on a regular basis.  The Lt adductors and Lt ischiocavernosis muscles were tight and TTP.   Pt had decreased sesativity after STM and MFR to those muscles.  Pt was educated on self massage techniques to continue working on desensitizing the vulva and re-engage with normal motor patterns.    PT Treatment/Interventions  ADLs/Self Care Home Management;Biofeedback;Electrical Stimulation;Iontophoresis 4mg /ml Dexamethasone;Moist Heat;Traction;Ultrasound;Therapeutic exercise;Balance training;Neuromuscular re-education;Therapeutic activities;Cryotherapy;Patient/family education;Manual techniques;Taping;Dry needling;Passive range of motion    PT Next Visit Plan  f/u on self massage; biofeedback    PT Home Exercise Plan  Access Code: [email or web address]    Consulted and Agree with Plan of Care  Patient       Patient will benefit from skilled therapeutic intervention in order to improve the following deficits and impairments:  Decreased range of motion, Decreased coordination, Decreased strength, Increased fascial restricitons, Pain, Postural dysfunction, Impaired tone, Increased muscle spasms  Visit Diagnosis: Muscle weakness (generalized)  Unspecified lack of coordination  Chronic low back pain, unspecified back pain laterality, unspecified whether sciatica present     Problem List Patient Active Problem List   Diagnosis Date Noted  . Other allergic rhinitis 03/29/2019  . Adverse food reaction 03/29/2019  . Lactose intolerance 03/29/2019  . Latex allergy 03/29/2019  . Shortness of breath 03/29/2019  . Allergies 02/17/2019  . Anxiety state 02/17/2019  . Right hip subluxation (HCC) 02/15/2019  . Lower abdominal pain 07/14/2018  . 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 DUB (dysfunctional uterine bleeding) 12/17/2017  . Healthcare maintenance 06/03/2017  . Axillary mass, left 01/08/2017  . 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 Chronic back pain 12/23/2014  . Mood disorder (HCC) 12/02/2012  . IDA (iron deficiency anemia) 07/16/2012  . Vitamin D deficiency 07/16/2012    07/18/2012, PT 02/04/2020, 12:27 PM  [facility] [address], [address] [place], [place], [place] Phone: [phone]   Fax:  [phone]  Name: Shelby Graves MRN: Karl Pock Date of Birth: 1991/04/30

## 2020-02-07 ENCOUNTER — Ambulatory Visit: Payer: Medicaid Other | Admitting: Physical Therapy

## 2020-02-10 ENCOUNTER — Encounter: Payer: Medicaid Other | Admitting: Physical Therapy

## 2020-02-14 ENCOUNTER — Telehealth: Payer: Self-pay

## 2020-02-14 ENCOUNTER — Other Ambulatory Visit: Payer: Self-pay

## 2020-02-14 ENCOUNTER — Inpatient Hospital Stay (HOSPITAL_COMMUNITY)
Admission: AD | Admit: 2020-02-14 | Discharge: 2020-02-14 | Disposition: A | Discharge status: Home / Self Care | Payer: Medicaid Other | Attending: Obstetrics and Gynecology | Admitting: Obstetrics and Gynecology

## 2020-02-14 DIAGNOSIS — R109 Unspecified abdominal pain: Secondary | ICD-10-CM | POA: Diagnosis present

## 2020-02-14 DIAGNOSIS — Z3202 Encounter for pregnancy test, result negative: Secondary | ICD-10-CM | POA: Insufficient documentation

## 2020-02-14 DIAGNOSIS — N939 Abnormal uterine and vaginal bleeding, unspecified: Secondary | ICD-10-CM | POA: Insufficient documentation

## 2020-02-14 LAB — POCT PREGNANCY, URINE: Preg Test, Ur: NEGATIVE

## 2020-02-14 LAB — HCG, QUANTITATIVE, PREGNANCY: hCG, Beta Chain, Quant, S: 3 m[IU]/mL (ref <5)

## 2020-02-14 NOTE — Discharge Instructions (Signed)
Human Chorionic Gonadotropin Test Why am I having this test? A human chorionic gonadotropin (hCG) test is done to determine whether you are pregnant. It can also be used:  To diagnose an abnormal pregnancy.  To determine whether you have had a failed pregnancy (miscarriage) or are at risk of one. What is being tested? This test checks the level of the human chorionic gonadotropin (hCG) hormone in the blood. This hormone is produced during pregnancy by the cells that form the placenta. The placenta is the organ that grows inside your womb (uterus) to nourish a developing baby. When you are pregnant, hCG can be detected in your blood or urine 7 to 8 days before your missed period. It continues to go up for the first 8-10 weeks of pregnancy. The presence of hCG in your blood can be measured with several different types of tests. You may have:  A urine test. ? Because this hormone is eliminated from your body by your kidneys, you may have a urine test to find out whether you are pregnant. A home pregnancy test detects whether there is hCG in your urine. ? A urine test only shows whether there is hCG in your urine. It does not measure how much.  A qualitative blood test. ? You may have this type of blood test to find out if you are pregnant. ? This blood test only shows whether there is hCG in your blood. It does not measure how much.  A quantitative blood test. ? This type of blood test measures the amount of hCG in your blood. ? You may have this test to:  Diagnose an abnormal pregnancy.  Check whether you have had a miscarriage.  Determine whether you are at risk of a miscarriage. What kind of sample is taken?     Two kinds of samples may be collected to test for the hCG hormone.  Blood. It is usually collected by inserting a needle into a blood vessel.  Urine. It is usually collected by urinating into a germ-free (sterile) specimen cup. It is best to collect the sample the first  time you urinate in the morning. How do I prepare for this test? No preparation is needed for a blood test.  For the urine test:  Let your health care provider know about: ? All medicines you are taking, including vitamins, herbs, creams, and over-the-counter medicines. ? Any blood in your urine. This may interfere with the result.  Do not drink too much fluid. Drink as you normally would, or as directed by your health care provider. How are the results reported? Depending on the type of test that you have, your test results may be reported as values. Your health care provider will compare your results to normal ranges that were established after testing a large group of people (reference ranges). Reference ranges may vary among labs and hospitals. For this test, common reference ranges that show absence of pregnancy are:  Quantitative hCG blood levels: less than 5 IU/L. Other results will be reported as either positive or negative. For this test, normal results (meaning the absence of pregnancy) are:  Negative for hCG in the urine test.  Negative for hCG in the qualitative blood test. What do the results mean? Urine and qualitative blood test  A negative result could mean: ? That you are not pregnant. ? That the test was done too early in your pregnancy to detect hCG in your blood or urine. If you still have other signs   of pregnancy, the test will be repeated.  A positive result means: ? That you are most likely pregnant. Your health care provider may confirm your pregnancy with an imaging study (ultrasound) of your uterus, if needed. Quantitative blood test Results of the quantitative hCG blood test will be interpreted as follows:  Less than 5 IU/L: You are most likely not pregnant.  Greater than 25 IU/L: You are most likely pregnant.  hCG levels that are higher than expected: ? You are pregnant with twins. ? You have abnormal growths in the uterus.  hCG levels that are  rising more slowly than expected: ? You have an ectopic pregnancy (also called a tubal pregnancy).  hCG levels that are falling: ? You may be having a miscarriage. Talk with your health care provider about what your results mean. Questions to ask your health care provider Ask your health care provider, or the department that is doing the test:  When will my results be ready?  How will I get my results?  What are my treatment options?  What other tests do I need?  What are my next steps? Summary  A human chorionic gonadotropin test is done to determine whether you are pregnant.  When you are pregnant, hCG can be detected in your blood or urine 7 to 8 days before your missed period. It continues to go up for the first 8-10 weeks of pregnancy.  Your hCG level can be measured with different types of tests. You may have a urine test, a qualitative blood test, or a quantitative blood test.  Talk with your health care provider about what your results mean. This information is not intended to replace advice given to you by your health care provider. Make sure you discuss any questions you have with your health care provider. Document Revised: 11/10/2017 Document Reviewed: 11/10/2017 Elsevier Patient Education  2020 Elsevier Inc.  

## 2020-02-14 NOTE — MAU Note (Signed)
.   Shelby Graves is a 29 y.o. at Unknown here in MAU reporting: 2 positive pregnancy test. Started having abdominal cramping on Friday with vaginal bleeding that started last night at work.  LMP: 01/13/20  Onset of complaint: Friday night Pain score: 7 Vitals:   02/14/20 0959  BP: (!) 109/48  Pulse: 98  Resp: 16     FHT: Lab orders placed from triage: UPT

## 2020-02-14 NOTE — MAU Provider Note (Signed)
First Provider Initiated Contact with Patient 02/14/20 1001     S [name] is a 29 y.o. [id number] pregnant female who presents to MAU with complaint of abdominal cramping and vaginal bleeding in setting of positive home pregnancy tests on 02/12 and 02/17. She endorses LMP of 01/13/2020.  O BP (!) 109/48   Pulse 98   Temp 98.1 F (36.7 C)   Resp 16   Ht 5\' 2"  (1.575 m)   Wt 73.9 kg   LMP 01/13/2020   BMI 29.81 kg/m    Physical Exam  Nursing note and vitals reviewed. Constitutional: She is oriented to person, place, and time. She appears well-developed and well-nourished.  Cardiovascular: Normal rate.  Respiratory: Effort normal.  GI: Soft.  Neurological: She is alert and oriented to person, place, and time.  Skin: Skin is warm and dry.  Psychiatric: She has a normal mood and affect. Her behavior is normal. Judgment and thought content normal.   A Negative urine pregnancy test in MAU Quant hCG today  =3 Medical screening exam complete  P Discharge from MAU in stable condition List of options for follow-up given  Warning signs for worsening condition that would warrant emergency follow-up discussed Patient may return to MAU as needed for pregnancy related complaints  01/15/2020, CNM 02/14/2020 11:31 AM

## 2020-02-14 NOTE — Telephone Encounter (Signed)
Pt called and reports that she took an at home UPT on 2/12 and had a faint positive, then she took another one on 2/17 and it was positive. Pt reports LMP 01/13/20. Pt reports she started having some cramping two days ago, and then yesterday when she was at work she started bleeding. Pt reports the bleeding is enough to fill up a pad every hour or so. I advised pt to go to the hospital for evaluation, pt voices understanding and says she will go.

## 2020-02-15 ENCOUNTER — Encounter: Payer: Self-pay | Admitting: Physical Therapy

## 2020-02-15 ENCOUNTER — Ambulatory Visit: Payer: Medicaid Other | Admitting: Physical Therapy

## 2020-02-15 DIAGNOSIS — M6281 Muscle weakness (generalized): Secondary | ICD-10-CM

## 2020-02-15 DIAGNOSIS — G8929 Other chronic pain: Secondary | ICD-10-CM

## 2020-02-15 DIAGNOSIS — R279 Unspecified lack of coordination: Secondary | ICD-10-CM

## 2020-02-15 NOTE — Therapy (Signed)
[facility] [address], [address] [place], [place], [address] Phone: [phone]   Fax:  [phone]  Physical Therapy Treatment  Patient Details  Name: Shelby Graves MRN: [id number] Date of Birth: Apr 17, 1991 Referring Provider (PT): Sharyon Cable, CNM   Encounter Date: 02/15/2020  PT End of Session - 02/15/20 1249    Visit Number  7    Date for PT Re-Evaluation  04/25/20    Authorization Type  mcaid    Authorization - Visit Number  3    Authorization - Number of Visits  8    PT Start Time  1233    PT Stop Time  1311    PT Time Calculation (min)  38 min    Activity Tolerance  Patient tolerated treatment well    Behavior During Therapy  [facility] for tasks assessed/performed       Past Medical History:  Diagnosis Date  . Anemia 2012  . Angio-edema   . Anxiety   . Arthritis   . Asthma 2009   last used inhaler 2009  . Depression   . H/O varicella   . 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 History of gonorrhea   . Hx of chlamydia infection   . Hyperventilation   . Migraine 2010  . PID (pelvic inflammatory disease) 2012   GC and chlamydia   . Urticaria     History reviewed. No pertinent surgical history.  There were no vitals filed for this visit.  Subjective Assessment - 02/15/20 1312    Subjective  Pt reports she had a miscarriage but was only pregnant for 2 weeks.  Pt report she is having a lot of cramping in abdomen currently and still bleeding.    Pertinent History  fibromyalgia    Patient Stated Goals  decrease pain to be able to work and care for her child    Currently in Pain?  No/denies                       [facility] Adult PT Treatment/Exercise - 02/15/20 0001      Neuro Re-ed    Neuro Re-ed Details   biofeedback used during all exercises: resting tone reduces slowly needs 8 sec rest between reps      Lumbar Exercises: Supine   Pelvic Tilt  10 reps    Other Supine Lumbar  Exercises  ball squeeze 4 sec hold    Other Supine Lumbar Exercises  LE straight - hip IR with kegel - unable to coordinate correctly      Lumbar Exercises: Sidelying   Clam  Right;Left;15 reps   with kegel     Lumbar Exercises: Prone   Other Prone Lumbar Exercises  knee press kegel -4 sec hold                  PT Long Term Goals - 01/25/20 1025      PT LONG TERM GOAL #1   Title  Pt will be ind with advanced HEP    Baseline  still learning      PT LONG TERM GOAL #2   Title  Pt will demonstrate 5/5 MMT bilateral hip abduction, adduction, and extension for improved strength for ascending/descending stairs without hip pain.    Baseline  4/5 MMT      PT LONG TERM GOAL #3   Title  Pt will report at she can work a full shift due to management of  pain    Baseline  can work 4 hours at most      PT [place] #4   Title  Pt will have diastatsis of rectus abdominus reduced to < 1 finger in order to stabilize pelvis and reduce incidents of hip locking up    Baseline  1.5 finger width            Plan - 02/15/20 1410    Clinical Impression Statement  Attmepted to use biofeedback to work on coordination of pelvic floor.  Pt continues to have difficulty feeling when she contracts the muscles, but able to get a couple of positions where she did this correctly.  She needs to co-contract muscles to get the pelvic floor to engage.  She will benefit from skilled PT to continue working on this muscle coordination.    PT Treatment/Interventions  ADLs/Self Care Home Management;Biofeedback;Electrical Stimulation;Iontophoresis 4mg /ml Dexamethasone;Moist Heat;Traction;Ultrasound;Therapeutic exercise;Balance training;Neuromuscular re-education;Therapeutic activities;Cryotherapy;Patient/family education;Manual techniques;Taping;Dry needling;Passive range of motion    PT Next Visit Plan  internal STM and work on contract/relax    PT Home Exercise Plan  Access Code: [email or web address]    Consulted  and Agree with Plan of Care  Patient       Patient will benefit from skilled therapeutic intervention in order to improve the following deficits and impairments:  Decreased range of motion, Decreased coordination, Decreased strength, Increased fascial restricitons, Pain, Postural dysfunction, Impaired tone, Increased muscle spasms  Visit Diagnosis: Muscle weakness (generalized)  Unspecified lack of coordination  Chronic low back pain, unspecified back pain laterality, unspecified whether sciatica present     Problem List Patient Active Problem List   Diagnosis Date Noted  . Other allergic rhinitis 03/29/2019  . Adverse food reaction 03/29/2019  . Lactose intolerance 03/29/2019  . Latex allergy 03/29/2019  . Shortness of breath 03/29/2019  . Allergies 02/17/2019  . Anxiety state 02/17/2019  . Right hip subluxation ([place]) 02/15/2019  . Lower abdominal pain 07/14/2018  . 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 DUB (dysfunctional uterine bleeding) 12/17/2017  . Healthcare maintenance 06/03/2017  . Axillary mass, left 01/08/2017  . 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 Chronic back pain 12/23/2014  . Mood disorder ([place]) 12/02/2012  . IDA (iron deficiency anemia) 07/16/2012  . Vitamin D deficiency 07/16/2012    Jule Ser, PT 02/15/2020, 2:19 PM  [facility] [address], [place], [place], [address] Phone: [phone]   Fax:  [phone]  Name: Beyla Loney MRN: [id number] Date of Birth: [birth year]

## 2020-02-17 ENCOUNTER — Other Ambulatory Visit: Payer: Self-pay

## 2020-02-17 ENCOUNTER — Ambulatory Visit: Payer: Medicaid Other | Admitting: Physical Therapy

## 2020-02-17 ENCOUNTER — Encounter: Payer: Self-pay | Admitting: Physical Therapy

## 2020-02-17 DIAGNOSIS — M6281 Muscle weakness (generalized): Secondary | ICD-10-CM

## 2020-02-17 DIAGNOSIS — M545 Low back pain, unspecified: Secondary | ICD-10-CM

## 2020-02-17 DIAGNOSIS — G8929 Other chronic pain: Secondary | ICD-10-CM

## 2020-02-17 DIAGNOSIS — R279 Unspecified lack of coordination: Secondary | ICD-10-CM

## 2020-02-17 NOTE — Therapy (Signed)
[facility] [address] [place], [place], [address] Phone: [phone]   Fax:  [phone]  Physical Therapy Treatment  Patient Details  Name: Shelby Graves MRN: [id number] Date of Birth: May 12, 1991 Referring Provider (PT): Lajean Manes, CNM   Encounter Date: 02/17/2020  PT End of Session - 02/17/20 1020    Visit Number  8    Date for PT Re-Evaluation  04/25/20    Authorization Type  mcaid    Authorization - Visit Number  4    Authorization - Number of Visits  8    PT Start Time  0630    PT Stop Time  1053    PT Time Calculation (min)  38 min    Activity Tolerance  Patient tolerated treatment well    Behavior During Therapy  [facility] for tasks assessed/performed       Past Medical History:  Diagnosis Date  . Anemia 2012  . Angio-edema   . Anxiety   . Arthritis   . Asthma 2009   last used inhaler 2009  . Depression   . H/O varicella   . 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 History of gonorrhea   . Hx of chlamydia infection   . Hyperventilation   . Migraine 2010  . PID (pelvic inflammatory disease) 2012   GC and chlamydia   . Urticaria     History reviewed. No pertinent surgical history.  There were no vitals filed for this visit.  Subjective Assessment - 02/17/20 1016    Subjective  Pt states pain has been the same.  States she has had low back and hip pain    Patient Stated Goals  decrease pain to be able to work and care for her child    Currently in Pain?  Yes    Pain Score  4     Pain Location  Back    Pain Orientation  Lower;Right    Pain Descriptors / Indicators  Aching    Pain Type  Chronic pain    Pain Onset  More than a month ago    Pain Frequency  Intermittent    Multiple Pain Sites  No                       [facility] Adult PT Treatment/Exercise - 02/17/20 0001      Manual Therapy   Soft tissue mobilization  bilat adductors O to I; bilateral gluteals                  PT Long Term Goals - 01/25/20 1025      PT LONG TERM GOAL #1   Title  Pt will be ind with advanced HEP    Baseline  still learning      PT LONG TERM GOAL #2   Title  Pt will demonstrate 5/5 MMT bilateral hip abduction, adduction, and extension for improved strength for ascending/descending stairs without hip pain.    Baseline  4/5 MMT      PT LONG TERM GOAL #3   Title  Pt will report at she can work a full shift due to management of pain    Baseline  can work 4 hours at most      PT [place] #4   Title  Pt will have diastatsis of rectus abdominus reduced to < 1 finger in order to stabilize pelvis and reduce incidents of hip locking up  Baseline  1.5 finger width            Plan - 02/17/20 1223    Clinical Impression Statement  Pt was still very sore today but unable to do internal STM due to having bleeding and she did not want to remove her tampon.  Pt responded well to STM.  She was very TTP Lt gluteal with very tight trigger point.  Pt has trigger points in Rt as well not as restricted but improved muscle pliability after STM.  Pt also very TTP Lt>Rt adductors    PT Treatment/Interventions  ADLs/Self Care Home Management;Biofeedback;Electrical Stimulation;Iontophoresis 4mg /ml Dexamethasone;Moist Heat;Traction;Ultrasound;Therapeutic exercise;Balance training;Neuromuscular re-education;Therapeutic activities;Cryotherapy;Patient/family education;Manual techniques;Taping;Dry needling;Passive range of motion    PT Next Visit Plan  internal STM and work on contract/relax    PT Home Exercise Plan  Access Code: [email or web address]    Consulted and Agree with Plan of Care  Patient       Patient will benefit from skilled therapeutic intervention in order to improve the following deficits and impairments:  Decreased range of motion, Decreased coordination, Decreased strength, Increased fascial restricitons, Pain, Postural dysfunction, Impaired tone, Increased muscle  spasms  Visit Diagnosis: Muscle weakness (generalized)  Unspecified lack of coordination  Chronic low back pain, unspecified back pain laterality, unspecified whether sciatica present     Problem List Patient Active Problem List   Diagnosis Date Noted  . Other allergic rhinitis 03/29/2019  . Adverse food reaction 03/29/2019  . Lactose intolerance 03/29/2019  . Latex allergy 03/29/2019  . Shortness of breath 03/29/2019  . Allergies 02/17/2019  . Anxiety state 02/17/2019  . Right hip subluxation ([facility]) 02/15/2019  . Lower abdominal pain 07/14/2018  . 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 DUB (dysfunctional uterine bleeding) 12/17/2017  . Healthcare maintenance 06/03/2017  . Axillary mass, left 01/08/2017  . 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 Chronic back pain 12/23/2014  . Mood disorder ([facility]) 12/02/2012  . IDA (iron deficiency anemia) 07/16/2012  . Vitamin D deficiency 07/16/2012    07/18/2012, PT 02/17/2020, 2:48 PM  [facility] [address], [address] [place], [place], [place] Phone: [phone]   Fax:  [phone]  Name: Shelby Graves MRN: Karl Pock Date of Birth: 1991-04-03

## 2020-02-21 ENCOUNTER — Ambulatory Visit: Payer: Medicaid Other | Admitting: Physical Therapy

## 2020-02-22 ENCOUNTER — Other Ambulatory Visit: Payer: Self-pay

## 2020-02-22 ENCOUNTER — Ambulatory Visit (HOSPITAL_COMMUNITY)
Admission: RE | Admit: 2020-02-22 | Discharge: 2020-02-22 | Disposition: A | Discharge status: Home / Self Care | Payer: Medicaid Other | Source: Ambulatory Visit | Attending: Certified Nurse Midwife | Admitting: Certified Nurse Midwife

## 2020-02-22 DIAGNOSIS — R9389 Abnormal findings on diagnostic imaging of other specified body structures: Secondary | ICD-10-CM | POA: Diagnosis not present

## 2020-02-24 ENCOUNTER — Other Ambulatory Visit: Payer: Self-pay

## 2020-02-24 ENCOUNTER — Ambulatory Visit: Payer: Medicaid Other | Attending: Certified Nurse Midwife | Admitting: Physical Therapy

## 2020-02-24 ENCOUNTER — Encounter: Payer: Self-pay | Admitting: Physical Therapy

## 2020-02-24 DIAGNOSIS — G8929 Other chronic pain: Secondary | ICD-10-CM | POA: Diagnosis present

## 2020-02-24 DIAGNOSIS — M545 Low back pain: Secondary | ICD-10-CM | POA: Diagnosis present

## 2020-02-24 DIAGNOSIS — R279 Unspecified lack of coordination: Secondary | ICD-10-CM

## 2020-02-24 DIAGNOSIS — M6281 Muscle weakness (generalized): Secondary | ICD-10-CM | POA: Insufficient documentation

## 2020-02-24 NOTE — Therapy (Addendum)
[facility] [address], [place], [place], [address] Phone: [phone]   Fax:  [phone]  Physical Therapy Treatment  Patient Details  Name: Shelby Graves MRN: [id number] Date of Birth: [birth year] Referring Provider (PT): Shelby Graves, CNM   Encounter Date: 02/24/2020  PT End of Session - 02/24/20 1023    Visit Number  9    Date for PT Re-Evaluation  04/25/20    Authorization Type  mcaid    Authorization - Visit Number  5    Authorization - Number of Visits  8    PT Start Time  1017    PT Stop Time  1057    PT Time Calculation (min)  40 min    Activity Tolerance  Patient tolerated treatment well    Behavior During Therapy  [facility] for tasks assessed/performed       Past Medical History:  Diagnosis Date  . Anemia 2012  . Angio-edema   . Anxiety   . Arthritis   . Asthma 2009   last used inhaler 2009  . Depression   . H/O varicella   . 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 History of gonorrhea   . Hx of chlamydia infection   . Hyperventilation   . Migraine 2010  . PID (pelvic inflammatory disease) 2012   GC and chlamydia   . Urticaria     History reviewed. No pertinent surgical history.  There were no vitals filed for this visit.  Subjective Assessment - 02/24/20 1021    Subjective  Pt states still feeling some hip pain and hips popping a lot when she walks.  Pt is also feeling cramping on the Rt lower abdomen    Pertinent History  fibromyalgia    Patient Stated Goals  decrease pain to be able to work and care for her child    Currently in Pain?  No/denies                       [facility] Adult PT Treatment/Exercise - 02/24/20 0001      Neuro Re-ed    Neuro Re-ed Details   tactile cues to relax with breathing      Lumbar Exercises: Supine   AB Set Limitations  TrA activation      Manual Therapy   Manual Therapy  Internal Pelvic Floor    Soft tissue mobilization  pt  identity confirmed and infored consent given to perofrm internal soft tissue assessment    Internal Pelvic Floor  bulbocavernosis and levators; tactile cues to inhale and relax and not bulge with TrA acitvation- she continues to relax when attempting to contract                  PT Long Term Goals - 01/25/20 1025      PT LONG TERM GOAL #1   Title  Pt will be ind with advanced HEP    Baseline  still learning      PT LONG TERM GOAL #2   Title  Pt will demonstrate 5/5 MMT bilateral hip abduction, adduction, and extension for improved strength for ascending/descending stairs without hip pain.    Baseline  4/5 MMT      PT LONG TERM GOAL #3   Title  Pt will report at she can work a full shift due to management of pain    Baseline  can work 4 hours at [employer]  PT LONG TERM GOAL #4   Title  Pt will have diastatsis of rectus abdominus reduced to < 1 finger in order to stabilize pelvis and reduce incidents of hip locking up    Baseline  1.5 finger width            Plan - 02/24/20 1259    Clinical Impression Statement  Pt continue to have impaired coordination.  She has 3 finger diastasis and tenderness at rectus abdominus attachment.  Pt was able to engage TrA with cues to make "S" sound.  Pt continues to be TTP pelvic floor levators and cavernosis muscles but able to tolerate some internal STM today. She will benefit from skilled PT to continue working on core strength and muscle coordination    PT Treatment/Interventions  ADLs/Self Care Home Management;Biofeedback;Electrical Stimulation;Iontophoresis 37m/ml Dexamethasone;Moist Heat;Traction;Ultrasound;Therapeutic exercise;Balance training;Neuromuscular re-education;Therapeutic activities;Cryotherapy;Patient/family education;Manual techniques;Taping;Dry needling;Passive range of motion    PT Next Visit Plan  internal STM and work on contract/relax; TrA to work on core strength and reduction of DRA    PT Home Exercise Plan  Access  Code: [email or web address]    Consulted and Agree with Plan of Care  Patient       Patient will benefit from skilled therapeutic intervention in order to improve the following deficits and impairments:  Decreased range of motion, Decreased coordination, Decreased strength, Increased fascial restricitons, Pain, Postural dysfunction, Impaired tone, Increased muscle spasms  Visit Diagnosis: Muscle weakness (generalized)  Unspecified lack of coordination  Chronic low back pain, unspecified back pain laterality, unspecified whether sciatica present     Problem List Patient Active Problem List   Diagnosis Date Noted  . Other allergic rhinitis 03/29/2019  . Adverse food reaction 03/29/2019  . Lactose intolerance 03/29/2019  . Latex allergy 03/29/2019  . Shortness of breath 03/29/2019  . Allergies 02/17/2019  . Anxiety state 02/17/2019  . Right hip subluxation ([place] 02/15/2019  . Lower abdominal pain 07/14/2018  . 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 DUB (dysfunctional uterine bleeding) 12/17/2017  . Healthcare maintenance 06/03/2017  . Axillary mass, left 01/08/2017  . Hemoglobin C trait ([place] 07/11/2016  . Chronic back pain 12/23/2014  . Mood disorder ([place] 12/02/2012  . IDA (iron deficiency anemia) 07/16/2012  . Vitamin D deficiency 07/16/2012    JJule Ser PT 02/24/2020, 1:09 PM  [facility] [address] [place] [address]Phone: [phone]  Fax:  [phone] Name: TNinfa GiannelliMRN: [id number] of Birth: [birth year] PHYSICAL THERAPY DISCHARGE SUMMARY  Visits from Start of Care: 9  Current functional level related to goals / functional outcomes: See above details   Remaining deficits: See above   Education / Equipment: HEP  Plan: Patient agrees to discharge.  Patient goals were not met. Patient is being discharged due to not returning since the last visit.  ?????      [employer] PT 06/28/20 1:00 PM

## 2020-03-02 ENCOUNTER — Ambulatory Visit (INDEPENDENT_AMBULATORY_CARE_PROVIDER_SITE_OTHER): Payer: Medicaid Other | Admitting: Advanced Practice Midwife

## 2020-03-02 ENCOUNTER — Encounter: Payer: Self-pay | Admitting: Advanced Practice Midwife

## 2020-03-02 ENCOUNTER — Other Ambulatory Visit: Payer: Self-pay

## 2020-03-02 VITALS — BP 110/67 | HR 93 | Ht 65.0 in | Wt 165.0 lb

## 2020-03-02 DIAGNOSIS — O039 Complete or unspecified spontaneous abortion without complication: Secondary | ICD-10-CM | POA: Diagnosis not present

## 2020-03-02 DIAGNOSIS — B9689 Other specified bacterial agents as the cause of diseases classified elsewhere: Secondary | ICD-10-CM

## 2020-03-02 DIAGNOSIS — N939 Abnormal uterine and vaginal bleeding, unspecified: Secondary | ICD-10-CM

## 2020-03-02 DIAGNOSIS — R102 Pelvic and perineal pain: Secondary | ICD-10-CM | POA: Diagnosis not present

## 2020-03-02 DIAGNOSIS — G8929 Other chronic pain: Secondary | ICD-10-CM

## 2020-03-02 DIAGNOSIS — N760 Acute vaginitis: Secondary | ICD-10-CM

## 2020-03-02 DIAGNOSIS — Z3009 Encounter for other general counseling and advice on contraception: Secondary | ICD-10-CM | POA: Diagnosis not present

## 2020-03-02 MED ORDER — NORETHIN-ETH ESTRAD-FE BIPHAS 1 MG-10 MCG / 10 MCG PO TABS: 1.0000 | ORAL_TABLET | Freq: Every day | ORAL | 11 refills | Status: DC

## 2020-03-02 NOTE — Patient Instructions (Signed)
Some natural remedies/prevention to try for bacterial vaginosis: --Take a probiotic tablet/capsule every day for at least 1-2 months.   --Whenever you have symptoms, use boric acid suppositories vaginally every night for a week.   --Do not use scented soaps/perfumes in the vaginal area, and do not overwash multiple times daily. --Wear breathable cotton underwear and do not wear tight restrictive clothing. --Limit pantyliner use, change your underwear several times daily instead. --Use condoms during intercourse.  

## 2020-03-02 NOTE — Progress Notes (Signed)
GYNECOLOGY PROGRESS NOTE  History:  29 y.o. G1P1001 presents to [place] office today for problem gyn visit. She reports she had a miscarriage last month and wants to follow up on this, her recent ultrasound, and talk about contraceptive options.  She had 2 positive pregnancy tests 1 week apart at home in early/mid February and presented to [facility] with onset of bleeding 02/14/20.  Hcg at that visit was 3, not consistent with pregnancy. I discussed this with pt and with 2 positive tests at home, this may have been a very early miscarriage.  The bleeding that occurred at that time was also heavier and more painful than the pt usual menses.  She reports she plans another pregnancy, but not for at least 6 months.  She has hx of sensitivity to hormonal contraception with side effects to Depo and OCPs.  She also reports frequent BV since October, when she started a new relationship.  She denies h/a, dizziness, shortness of breath, n/v, or fever/chills.    The following portions of the patient's history were reviewed and updated as appropriate: allergies, current medications, past family history, past medical history, past social history, past surgical history and problem list. Last pap smear on 12/29/19 was normal.  Review of Systems:  Pertinent items are noted in HPI.   Objective:  Physical Exam Blood pressure 110/67, pulse 93, height 5\' 5"  (1.651 m), weight 165 lb (74.8 kg). VS reviewed, nursing note reviewed,  Constitutional: well developed, well nourished, no distress HEENT: normocephalic CV: normal rate Pulm/chest wall: normal effort Breast Exam: deferred Abdomen: soft Neuro: alert and oriented x 3 Skin: warm, dry Psych: affect normal Pelvic exam: Cervix pink, visually closed, without lesion, scant white creamy discharge, vaginal walls and external genitalia normal Bimanual exam: Cervix 0/long/high, firm, anterior, neg CMT, uterus nontender, nonenlarged, adnexa without tenderness, enlargement,  or mass  Assessment & Plan:  1. Encounter for counseling regarding contraception --Discussed LARCs as most effective forms of birth control.  Discussed benefits/risks of other methods.  Pt initially undecided between trying Depo again, which caused weight gain but not migraines like OCPs, or the contraceptive patch.  When the patch was reviewed as compared to LARCs and low dose OCPs, pt desires to try low dose OCPs. Discussed failure rates of OCPs with regular use. Reviewed risk factors and s/sx of DVT/PE/reasons to seek medical care.   --Pt to f/u in 2 months, consider progesterone only contraception if migraines with low dose estrogen as well.  Also consider continuous options with less menses (progesterone only or continuous OCPs) to treat possible endometriosis (see below).  - Norethindrone-Ethinyl Estradiol-Fe Biphas (LO LOESTRIN FE) 1 MG-10 MCG / 10 MCG tablet; Take 1 tablet by mouth daily.  Dispense: 1 Package; Refill: 11  2. SAB (spontaneous abortion) --Pt convinced she had a pregnancy and miscarried.  Given the hcg of 3 at the time she presented to [facility], and vaginal bleeding was on schedule for menses, this is difficult to prove.  But, pt had 2 positive home tests, from different manufacturers, at least 1 week apart. She followed instructions for the tests.  This makes early pregnancy and loss more likely.  3. Abnormal uterine bleeding (AUB) --Pt has hx painful, irregular periods but menses have been regular with ~28 day cycle x 3-4 months. --Pt [place] in January showed thickened endometrial lining at 25 mm, so follow up ordered and completed 02/22/20.  Shows mildly thickened lining at 10 mm, reduced from previous US.  4. Chronic pelvic  pain in female --Pt does not usually need medications but has painful menses, and has since menarche.  Took ibuprofen with bleeding last month,  --Pt asked about endometriosis, which may be contributing to pt pain.  Hormonal contraception are mainstay of  treatment so will try, and follow up in 2 months.    5. Recurrent Bacterial Vaginosis --Pt without symptoms today but reports frequent symptoms x 5-6 months. --Verbal and printed teaching done about preventing vaginal infections including probiotics.  Pt states understanding.   Sharen Counter, CNM 10:41 AM

## 2020-03-02 NOTE — Progress Notes (Signed)
GYN presents for FU ultrasound.

## 2020-03-31 ENCOUNTER — Ambulatory Visit (INDEPENDENT_AMBULATORY_CARE_PROVIDER_SITE_OTHER): Payer: Medicaid Other

## 2020-03-31 ENCOUNTER — Other Ambulatory Visit: Payer: Self-pay

## 2020-03-31 DIAGNOSIS — Z3201 Encounter for pregnancy test, result positive: Secondary | ICD-10-CM

## 2020-03-31 DIAGNOSIS — N912 Amenorrhea, unspecified: Secondary | ICD-10-CM

## 2020-03-31 LAB — POCT URINE PREGNANCY: Preg Test, Ur: POSITIVE — AB

## 2020-03-31 NOTE — Progress Notes (Signed)
Ms. Ibe presents today for UPT. She has no unusual complaints. LMP:02/13/20    OBJECTIVE: Appears well, in no apparent distress.  OB History    Gravida  1   Para  1   Term  1   Preterm  0   AB  0   Living  1     SAB  0   TAB  0   Ectopic  0   Multiple  0   Live Births  1          Home UPT Result:Positive  In-Office UPT result: Positive  I have reviewed the patient's medical, obstetrical, social, and family histories, and medications.   ASSESSMENT: Positive pregnancy test  PLAN Prenatal care to be completed at: Plans to have abortion. Pt expressed unplanned pregnancy Pt asked about abortion pill pt made aware we do not offer abortion options here and that would be something she would have to research  if that is her decision. I let pt know to reach out to planned parent hood regarding the process.  Pt voiced understanding.

## 2020-04-10 ENCOUNTER — Ambulatory Visit: Payer: Medicaid Other | Attending: Internal Medicine

## 2020-04-10 DIAGNOSIS — Z20822 Contact with and (suspected) exposure to covid-19: Secondary | ICD-10-CM

## 2020-04-11 LAB — SARS-COV-2, NAA 2 DAY TAT

## 2020-04-11 LAB — NOVEL CORONAVIRUS, NAA: SARS-CoV-2, NAA: NOT DETECTED

## 2020-05-29 ENCOUNTER — Telehealth (INDEPENDENT_AMBULATORY_CARE_PROVIDER_SITE_OTHER): Payer: Medicaid Other | Admitting: Family Medicine

## 2020-05-29 ENCOUNTER — Encounter: Payer: Self-pay | Admitting: Family Medicine

## 2020-05-29 DIAGNOSIS — M25531 Pain in right wrist: Secondary | ICD-10-CM | POA: Diagnosis not present

## 2020-05-29 DIAGNOSIS — M25532 Pain in left wrist: Secondary | ICD-10-CM | POA: Diagnosis not present

## 2020-05-29 NOTE — Progress Notes (Signed)
Virtual Visit via Video Note  I connected with Shelby Graves on 05/29/20 at  2:45 PM EDT by a video enabled telemedicine application and verified that I am speaking with the correct person using two identifiers.  Location: Patient: Home Provider: Office   I discussed the limitations of evaluation and management by telemedicine and the availability of in person appointments. The patient expressed understanding and agreed to proceed.  History of Present Illness: 29 y/o Female reports a few weeks of worsening hand, thumb pain. No acute injury or trauma. Has had numbness and tingling of middle and ring fingers of both hands. 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 Reports cramping up of her hands as well. Tried voltaren gel with some benefit. Pain in volar side primarily but does get some into dorsal thumb/hand. Has dropped items including knives due to weakness.   Observations/Objective: Gen: NAD, comfortable in exam room  Bilateral hands/wrists: No obvious swelling, bruising, deformity.  Assessment and Plan: 1. Bilateral hand/wrist pain - unusual distribution consistent with either overuse flexor tendinopathy or atypical peripheral nerve syndrome.  Advised she come into the office for ultrasound and exam at her convenience.    Follow Up Instructions:    I discussed the assessment and treatment plan with the patient. The patient was provided an opportunity to ask questions and all were answered. The patient agreed with the plan and demonstrated an understanding of the instructions.   The patient was advised to call back or seek an in-person evaluation if the symptoms worsen or if the condition fails to improve as anticipated.  I provided 22 minutes of non-face-to-face time during this encounter.   Norton Blizzard, MD

## 2020-05-31 ENCOUNTER — Telehealth: Payer: Medicaid Other | Admitting: Family Medicine

## 2020-06-01 ENCOUNTER — Telehealth: Payer: Self-pay

## 2020-06-01 NOTE — Telephone Encounter (Signed)
TC from pt having trouble at the pharmacy getting her BCP's Contacted the pharmacy - BC pill are ready for pick up at $0 Pt aware

## 2020-06-06 NOTE — Patient Instructions (Signed)
It was a pleasure talking to you today.  Carollee Leitz, MD [facility]    Levonorgestrel intrauterine device (IUD) What is this medicine? LEVONORGESTREL IUD (LEE voe nor jes trel) is a contraceptive (birth control) device. The device is placed inside the uterus by a healthcare professional. It is used to prevent pregnancy. This device can also be used to treat heavy bleeding that occurs during your period. This medicine may be used for other purposes; ask your health care provider or pharmacist if you have questions. COMMON BRAND NAME(S): Minette Headland What should I tell my health care provider before I take this medicine? They need to know if you have any of these conditions:  abnormal Pap smear  cancer of the breast, uterus, or cervix  diabetes  endometritis  genital or pelvic infection now or in the past  have more than one sexual partner or your partner has more than one partner  heart disease  history of an ectopic or tubal pregnancy  immune system problems  IUD in place  liver disease or tumor  problems with blood clots or take blood-thinners  seizures  use intravenous drugs  uterus of unusual shape  vaginal bleeding that has not been explained  an unusual or allergic reaction to levonorgestrel, other hormones, silicone, or polyethylene, medicines, foods, dyes, or preservatives  pregnant or trying to get pregnant  breast-feeding How should I use this medicine? This device is placed inside the uterus by a health care professional. Talk to your pediatrician regarding the use of this medicine in children. Special care may be needed. Overdosage: If you think you have taken too much of this medicine contact a poison control center or emergency room at once. NOTE: This medicine is only for you. Do not share this medicine with others. What if I miss a dose? This does not apply. Depending on the brand of device you have inserted,  the device will need to be replaced every 3 to 6 years if you wish to continue using this type of birth control. What may interact with this medicine? Do not take this medicine with any of the following medications:  amprenavir  bosentan  fosamprenavir This medicine may also interact with the following medications:  aprepitant  armodafinil  barbiturate medicines for inducing sleep or treating seizures  bexarotene  boceprevir  griseofulvin  medicines to treat seizures like carbamazepine, ethotoin, felbamate, oxcarbazepine, phenytoin, topiramate  modafinil  pioglitazone  rifabutin  rifampin  rifapentine  some medicines to treat HIV infection like atazanavir, efavirenz, indinavir, lopinavir, nelfinavir, tipranavir, ritonavir  St. John's wort  warfarin This list may not describe all possible interactions. Give your health care provider a list of all the medicines, herbs, non-prescription drugs, or dietary supplements you use. Also tell them if you smoke, drink alcohol, or use illegal drugs. Some items may interact with your medicine. What should I watch for while using this medicine? Visit your doctor or health care professional for regular check ups. See your doctor if you or your partner has sexual contact with others, becomes HIV positive, or gets a sexual transmitted disease. This product does not protect you against HIV infection (AIDS) or other sexually transmitted diseases. You can check the placement of the IUD yourself by reaching up to the top of your vagina with clean fingers to feel the threads. Do not pull on the threads. It is a good habit to check placement after each menstrual period. Call your doctor right away if  you feel more of the IUD than just the threads or if you cannot feel the threads at all. The IUD may come out by itself. You may become pregnant if the device comes out. If you notice that the IUD has come out use a backup birth control method like  condoms and call your health care provider. Using tampons will not change the position of the IUD and are okay to use during your period. This IUD can be safely scanned with magnetic resonance imaging (MRI) only under specific conditions. Before you have an MRI, tell your healthcare provider that you have an IUD in place, and which type of IUD you have in place. What side effects may I notice from receiving this medicine? Side effects that you should report to your doctor or health care professional as soon as possible:  allergic reactions like skin rash, itching or hives, swelling of the face, lips, or tongue  fever, flu-like symptoms  genital sores  high blood pressure  no menstrual period for 6 weeks during use  pain, swelling, warmth in the leg  pelvic pain or tenderness  severe or sudden headache  signs of pregnancy  stomach cramping  sudden shortness of breath  trouble with balance, talking, or walking  unusual vaginal bleeding, discharge  yellowing of the eyes or skin Side effects that usually do not require medical attention (report to your doctor or health care professional if they continue or are bothersome):  acne  breast pain  change in sex drive or performance  changes in weight  cramping, dizziness, or faintness while the device is being inserted  headache  irregular menstrual bleeding within first 3 to 6 months of use  nausea This list may not describe all possible side effects. Call your doctor for medical advice about side effects. You may report side effects to FDA at 1-800-FDA-1088. Where should I keep my medicine? This does not apply. NOTE: This sheet is a summary. It may not cover all possible information. If you have questions about this medicine, talk to your doctor, pharmacist, or health care provider.  2020 Elsevier/Gold Standard (2018-10-20 13:22:01)

## 2020-06-06 NOTE — Progress Notes (Signed)
Called patient and she stated that she had cancelled appointment as she started a new job.  Dana Allan, MD Family Medicine [facility]

## 2020-06-07 ENCOUNTER — Telehealth (INDEPENDENT_AMBULATORY_CARE_PROVIDER_SITE_OTHER): Payer: Medicaid Other | Admitting: Family Medicine

## 2020-06-07 ENCOUNTER — Ambulatory Visit: Payer: Medicaid Other | Admitting: Family Medicine

## 2020-06-07 ENCOUNTER — Other Ambulatory Visit: Payer: Self-pay

## 2020-06-07 DIAGNOSIS — Z309 Encounter for contraceptive management, unspecified: Secondary | ICD-10-CM

## 2020-06-23 ENCOUNTER — Other Ambulatory Visit: Payer: Self-pay

## 2020-06-23 MED ORDER — ACYCLOVIR 5 % EX CREA: 1.0000 "application " | TOPICAL_CREAM | CUTANEOUS | 0 refills | Status: DC

## 2020-06-27 ENCOUNTER — Other Ambulatory Visit: Payer: Self-pay | Admitting: Family Medicine

## 2020-06-27 ENCOUNTER — Telehealth: Payer: Self-pay | Admitting: *Deleted

## 2020-06-27 MED ORDER — ACYCLOVIR 5 % EX OINT: 1.0000 "application " | TOPICAL_OINTMENT | CUTANEOUS | 1 refills | Status: DC

## 2020-06-27 NOTE — Telephone Encounter (Signed)
New order filled for ointment.

## 2020-06-27 NOTE — Telephone Encounter (Signed)
Received fax from [facility] pharmacy stating that pt does not want the acyclovir cream she wants the ointment.  Please resend Rx.  Pt returned the cream that was filled.Jafar Poffenberger Zimmerman Rumple, CMA

## 2020-06-27 NOTE — Progress Notes (Signed)
Acyclovir Ointment ordered.  Cream canceled.

## 2020-07-06 ENCOUNTER — Encounter: Payer: Self-pay | Admitting: Family Medicine

## 2020-07-06 ENCOUNTER — Other Ambulatory Visit: Payer: Self-pay

## 2020-07-06 ENCOUNTER — Ambulatory Visit: Payer: Medicaid Other | Admitting: Family Medicine

## 2020-07-06 ENCOUNTER — Ambulatory Visit (INDEPENDENT_AMBULATORY_CARE_PROVIDER_SITE_OTHER): Payer: Medicaid Other | Admitting: Family Medicine

## 2020-07-06 ENCOUNTER — Encounter: Payer: Medicaid Other | Admitting: Family Medicine

## 2020-07-06 ENCOUNTER — Other Ambulatory Visit (HOSPITAL_COMMUNITY)
Admission: RE | Admit: 2020-07-06 | Discharge: 2020-07-06 | Disposition: A | Discharge status: Home / Self Care | Payer: Medicaid Other | Source: Ambulatory Visit | Attending: Family Medicine | Admitting: Family Medicine

## 2020-07-06 ENCOUNTER — Ambulatory Visit
Admission: RE | Admit: 2020-07-06 | Discharge: 2020-07-06 | Disposition: A | Discharge status: Home / Self Care | Payer: Medicaid Other | Source: Ambulatory Visit | Attending: Family Medicine | Admitting: Family Medicine

## 2020-07-06 VITALS — BP 122/82 | Ht 65.0 in | Wt 163.0 lb

## 2020-07-06 VITALS — BP 108/82 | HR 105 | Ht 65.0 in | Wt 165.2 lb

## 2020-07-06 DIAGNOSIS — M25551 Pain in right hip: Secondary | ICD-10-CM | POA: Diagnosis not present

## 2020-07-06 DIAGNOSIS — M25531 Pain in right wrist: Secondary | ICD-10-CM

## 2020-07-06 DIAGNOSIS — M549 Dorsalgia, unspecified: Secondary | ICD-10-CM

## 2020-07-06 DIAGNOSIS — G8929 Other chronic pain: Secondary | ICD-10-CM

## 2020-07-06 DIAGNOSIS — Z202 Contact with and (suspected) exposure to infections with a predominantly sexual mode of transmission: Secondary | ICD-10-CM | POA: Diagnosis present

## 2020-07-06 DIAGNOSIS — E559 Vitamin D deficiency, unspecified: Secondary | ICD-10-CM

## 2020-07-06 DIAGNOSIS — M25532 Pain in left wrist: Secondary | ICD-10-CM | POA: Diagnosis not present

## 2020-07-06 DIAGNOSIS — M545 Low back pain, unspecified: Secondary | ICD-10-CM

## 2020-07-06 DIAGNOSIS — Z23 Encounter for immunization: Secondary | ICD-10-CM | POA: Diagnosis not present

## 2020-07-06 DIAGNOSIS — B373 Candidiasis of vulva and vagina: Secondary | ICD-10-CM

## 2020-07-06 DIAGNOSIS — B3731 Acute candidiasis of vulva and vagina: Secondary | ICD-10-CM

## 2020-07-06 LAB — POCT UA - MICROSCOPIC ONLY

## 2020-07-06 LAB — POCT URINALYSIS DIP (MANUAL ENTRY)
Bilirubin, UA: NEGATIVE
Blood, UA: NEGATIVE
Glucose, UA: NEGATIVE mg/dL
Ketones, POC UA: NEGATIVE mg/dL
Nitrite, UA: NEGATIVE
Protein Ur, POC: NEGATIVE mg/dL
Spec Grav, UA: 1.02 (ref 1.010–1.025)
Urobilinogen, UA: 0.2 E.U./dL
pH, UA: 7 (ref 5.0–8.0)

## 2020-07-06 LAB — POCT WET PREP (WET MOUNT)
Clue Cells Wet Prep Whiff POC: NEGATIVE
Trichomonas Wet Prep HPF POC: ABSENT

## 2020-07-06 MED ORDER — DULOXETINE HCL 30 MG PO CPEP: 30.0000 mg | ORAL_CAPSULE | Freq: Every day | ORAL | 12 refills | Status: AC

## 2020-07-06 MED ORDER — ACYCLOVIR 5 % EX OINT: 1.0000 "application " | TOPICAL_OINTMENT | CUTANEOUS | 6 refills | Status: AC

## 2020-07-06 MED ORDER — GABAPENTIN 100 MG PO CAPS: 100.0000 mg | ORAL_CAPSULE | Freq: Three times a day (TID) | ORAL | 3 refills | Status: AC

## 2020-07-06 NOTE — Progress Notes (Signed)
PCP: Sandre Kitty, MD  Subjective:   HPI: Patient is a 29 y.o. female here for multiple issues.  08/18/18: Patient is reporting at least 4-6 months of pain in her low back bilaterally that radiates down both legs. Also with bilateral posterior shoulder pain for 4-90months at 7/10 level. Associated tingling, numbness down both arms. States difficulty extending her elbows. Both shoulders feel weak. Has 6/10 level of pain in low back. Reports at times has had swelling, redness of her wrists and knees. Pain goes throughout body. No bowel/bladder dysfunction.  06/28/19: Patient reports she continues to struggle with pain issues. Has seen rheumatology, dx with myofascial pain. She's taking prenatal vitamin and vitamin D.  Tried low dose gabapentin, cymbalta but did not tolerate. Reports pain in low back, both hips though right is worse. Stuck sensation of right hip where she has to stop in position for several seconds. Pulling sensation posterior left knee. And a burning localized pain to neck and spine. No skin changes.  07/06/20: Patient returned today to primarily review her bilateral wrist pain though having other pain issues as well. She is having tingling in bilateral hands, worse on the right. Feels now primarily in 4th and 5th digits, some in 3rd digit of right hand. Feels this also from the elbow distally. Reports she wants to go to the gym again and start exercising. In morning she feels 'frozen' in place for 1-2 minutes then can get moving. She is going to start cymbalta again and try this. Having pain in right anterior hip without new injury. Pain worse after prolonged sitting. Had a massage which helped for her back and neck pain.  Past Medical History:  Diagnosis Date  . Anemia 2012  . Angio-edema   . Anxiety   . Arthritis   . Asthma 2009   last used inhaler 2009  . Depression   . H/O varicella   . 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 History of  gonorrhea   . Hx of chlamydia infection   . Hyperventilation   . Migraine 2010  . PID (pelvic inflammatory disease) 2012   GC and chlamydia   . Urticaria     Current Outpatient Medications on File Prior to Visit  Medication Sig Dispense Refill  . Albuterol Sulfate, sensor, 108 (90 Base) MCG/ACT AEPB Inhale 2 puffs into the lungs every 4 (four) hours as needed. (Patient not taking: Reported on 01/19/2020) 1 each 1  . AUVI-Q 0.3 MG/0.3ML SOAJ injection Inject 0.3 mLs (0.3 mg total) into the muscle as needed for anaphylaxis. (Patient not taking: Reported on 01/19/2020) 2 Device 1  . diclofenac Sodium (VOLTAREN) 1 % GEL Apply 2 g topically 4 (four) times daily.    . ferrous sulfate 325 (65 FE) MG tablet Take 1 tablet (325 mg total) by mouth 2 (two) times daily with a meal. (Patient not taking: Reported on 12/29/2019) 60 tablet 3  . mometasone (NASONEX) 50 MCG/ACT nasal spray Place 2 sprays into the nose daily as needed (congestion).    . montelukast (SINGULAIR) 10 MG tablet Take 1 tablet (10 mg total) by mouth at bedtime. (Patient not taking: Reported on 01/19/2020) 30 tablet 5  . Norethindrone-Ethinyl Estradiol-Fe Biphas (LO LOESTRIN FE) 1 MG-10 MCG / 10 MCG tablet Take 1 tablet by mouth daily. 1 Package 11  . Polyvinyl Alcohol-Povidone PF (REFRESH) 1.4-0.6 % SOLN Apply 1 drop to eye as needed (dry eyes).    . Prenat-FeCbn-FeAspGl-FA-Omega (OB COMPLETE PETITE) 35-5-1-200 MG CAPS  Take 1 capsule by mouth daily.    . Vitamin D, Ergocalciferol, (DRISDOL) 1.25 MG (50000 UT) CAPS capsule Take 1 capsule (50,000 Units total) by mouth 2 (two) times a week. 24 capsule 0   No current facility-administered medications on file prior to visit.    History reviewed. No pertinent surgical history.  Allergies  Allergen Reactions  . Latex Hives    Social History   Socioeconomic History  . Marital status: Single    Spouse name: Not on file  . Number of children: 1  . Years of education: 27  . Highest  education level: Bachelor's degree (e.g., BA, AB, BS)  Occupational History    Comment: [employer]  Tobacco Use  . Smoking status: Former Smoker    Types: Cigarettes    Quit date: 10/21/2013    Years since quitting: 6.7  . Smokeless tobacco: Never Used  . Tobacco comment: quit yrs ago - OCC FOR 1 YEAR  Vaping Use  . Vaping Use: Never used  Substance and Sexual Activity  . Alcohol use: Not Currently  . Drug use: Not Currently    Types: Marijuana  . Sexual activity: Yes    Birth control/protection: None  Other Topics Concern  . Not on file  Social History Narrative   Lives with family, 2 old dgtr 09/2018    Own apartment.    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 Difficulty of Paying Living Expenses:   Food Insecurity:   . Worried About Programme researcher, broadcasting/film/video in the Last Year:   . Barista in the Last Year:   Transportation Needs:   . Freight forwarder (Medical):   Marland Kitchen Lack of Transportation (Non-Medical):   Physical Activity:   . Days of Exercise per Week:   . Minutes of Exercise per Session:   Stress:   . Feeling of Stress :   Social Connections:   . Frequency of Communication with Friends and Family:   . Frequency of Social Gatherings with Friends and Family:   . Attends Religious Services:   . Active Member of Clubs or Organizations:   . Attends Banker Meetings:   Marland Kitchen Marital Status:   Intimate Partner Violence:   . Fear of Current or Ex-Partner:   . Emotionally Abused:   Marland Kitchen Physically Abused:   . Sexually Abused:     Family History  Problem Relation Age of Onset  . Hyperlipidemia Mother   . Hypertension Mother   . Kidney disease Mother   . Hypertension Father   . Hypertension Brother   . Hyperlipidemia Brother   . Diabetes Maternal Grandmother   . Diabetes Maternal Grandfather   . Cancer Maternal Aunt 58       Ovarian CA    BP 122/82   Ht 5\' 5"  (1.651  m)   Wt 163 lb (73.9 kg)   BMI 27.12 kg/m   Review of Systems: See HPI above.     Objective:  Physical Exam:  Gen: NAD, comfortable in exam room  Bilateral hands/wrists: No deformity. FROM with 5/5 strength abduction, extension, flexion, thumb opposition. Mild tenderness to palpation diffusely volar hand/wrist. NVI distally. Negative tinels carpal tunnel and guyon's canal Negative phalens.  Bilateral elbows: Positive tinels right cubital tunnel.  Right hip: No deformity. Mild limitation IR.  Full ER.  5/5 strength. No tenderness to palpation. NVI  distally. Positive logroll.  Limited MSK u/s Right hand/wrist:  Median nerve volume 0.07cm2.  No abnormalities of flexor tendons through hand and wrist.  No ganglion cyst.  Limited MSK u/s left hand/wrist:  Median nerve volume 0.08cm2  Limited MSK u/s right elbow:  No spurring noted medially.  No subluxation of ulnar nerve on dynamic views.  No swelling of ulnar nerve.  Assessment & Plan:  1. Bilateral hand/wrist pain - She is having symptoms currently of cubital tunnel on right.  No longer with symptoms consistent with carpal tunnel or flexor tendinitis.  Ultrasound reassuring.  Can consider wrist braces.  Elbow sleeve on right to help prevent flexion that will aggravate cubital tunnel.  Start cymbalta.  2. Right hip pain - positive logroll and mild limitation of IR in setting of anterior hip pain.  Will go ahead with radiographs to assess.  F/u in 6 weeks otherwise for both issues.

## 2020-07-06 NOTE — Patient Instructions (Signed)
I will call with the lab test results I sent in prescriptions. My nurse will give you a tetanus shot. Please get the COVID vaccine See your regular doctor - Dr. Constance Goltz in three.

## 2020-07-06 NOTE — Progress Notes (Signed)
    SUBJECTIVE:   HPI:   Hip, shoulder, and wrist pain - Chronic. Worsening since delivery. Worse with movement. Shoulder and wrist pain limits ability to write. Has not been taking duloxetine (ran out). Patient previously taking Vitamin D supplement (50,000 units, twice a week).     Back pain -Reports consistent left sided back pain for the past 2-3 months. Describes burning with peeing.   Vaginal irritation- Itchy vulva. Patient reports serial yeast infections in the past few weeks. Vaginal discharge is thick and white, without odor. Additionally, reports irritation upon sex with new partner since December 2020, expressing concerns for sperm allergy and STI. Describes relationship as monogamous.  Vaccination: Has not gotten COVID-19 vaccine due to personal concerns regarding allergy.  OBJECTIVE:   BP 108/82   Pulse (!) 105   Ht 5\' 5"  (1.651 m)   Wt 165 lb 3.2 oz (74.9 kg)   SpO2 96%   BMI 27.49 kg/m    General: well appearing, no acute distress. Pleasant and engaged. HEENT: normocephalic, atraumatic Cardiac: regular rate, regular rythmn. No murmurs Pulmonary: normal work of breathing, clear to auscultation bilaterally Psych: Normal affect. Mildly anxious. Speech of regular volume and rate.  Pelvic exam: moderate white discharge, appears somewhat curdled. Diffuse tenderness to palpation  ASSESSMENT/PLAN:   Immunizations:  -Tdap today -Advised to get COVID-19 vaccine if able.    Hip, Shoulder, and wrist pain -Follows with Sport Medicine. Appointment today.  -Duloxetine refill -vitamin d level    Vaginal irritation  -wet prep -GC/Chlamydia -RPR -routine HIV testing  Unilateral Back pain   -urinalysis   No problem-specific Assessment & Plan notes found for this encounter.     , Medical Student [facility]

## 2020-07-06 NOTE — Patient Instructions (Signed)
Your ultrasound looks great - no evidence of carpal tunnel and flexor tendons without tendinitis. Get x-rays of your right hip after you leave today - we will call you with the results. Consider a sleeve for your elbow to help prevent you from bending it which will pinch the nerve here going into your hand. Start the cymbalta and titrate up as directed by your family doctor. Do home stretches/exercises for your trapezius. I think getting into the gym more is a great idea as you noted. Follow up with me in 6 weeks for reevaluation.

## 2020-07-07 ENCOUNTER — Encounter: Payer: Self-pay | Admitting: Family Medicine

## 2020-07-07 ENCOUNTER — Telehealth: Payer: Self-pay | Admitting: Family Medicine

## 2020-07-07 DIAGNOSIS — B373 Candidiasis of vulva and vagina: Secondary | ICD-10-CM | POA: Insufficient documentation

## 2020-07-07 DIAGNOSIS — E559 Vitamin D deficiency, unspecified: Secondary | ICD-10-CM

## 2020-07-07 DIAGNOSIS — B3731 Acute candidiasis of vulva and vagina: Secondary | ICD-10-CM | POA: Insufficient documentation

## 2020-07-07 LAB — VITAMIN D 25 HYDROXY (VIT D DEFICIENCY, FRACTURES): Vit D, 25-Hydroxy: 28.7 ng/mL — ABNORMAL LOW (ref 30.0–100.0)

## 2020-07-07 LAB — CERVICOVAGINAL ANCILLARY ONLY
Chlamydia: NEGATIVE
Comment: NEGATIVE
Comment: NORMAL
Neisseria Gonorrhea: NEGATIVE

## 2020-07-07 LAB — HIV ANTIBODY (ROUTINE TESTING W REFLEX): HIV Screen 4th Generation wRfx: NONREACTIVE

## 2020-07-07 LAB — RPR: RPR Ser Ql: NONREACTIVE

## 2020-07-07 MED ORDER — FLUCONAZOLE 150 MG PO TABS: 150.0000 mg | ORAL_TABLET | Freq: Once | ORAL | 0 refills | Status: AC

## 2020-07-07 MED ORDER — CALCIUM 500 +D 500-400 MG-UNIT PO TABS: 1.0000 | ORAL_TABLET | Freq: Every day | ORAL | Status: AC

## 2020-07-07 NOTE — Assessment & Plan Note (Signed)
Prefers the one pill

## 2020-07-07 NOTE — Assessment & Plan Note (Signed)
Screen again.

## 2020-07-07 NOTE — Assessment & Plan Note (Signed)
Diflucan

## 2020-07-07 NOTE — Telephone Encounter (Signed)
Called.  See problems of candida vaginitis and vit d deficiency.

## 2020-07-07 NOTE — Assessment & Plan Note (Signed)
Check level per request No currently on med

## 2020-07-07 NOTE — Progress Notes (Signed)
I was either physically present and/or repeated all elements of the H&PE by MS3 Daryll Drown.   1. Recurrent vaginitis.  Wet prep reveals yeast.  Will Rx with diflucan 2. Exposure to STD.  Wants tested 3. Due for a tetanus.  She will take. 4. Has not gotten Covid vaccine because of reservations.  Strongly encouraged to take.   5. Refill several meds.  6. Some dysuria.  UA does not suggest UTI.

## 2020-07-07 NOTE — Assessment & Plan Note (Signed)
Mildly low.  Treat with OTC calcium and vit d

## 2020-07-12 ENCOUNTER — Telehealth: Payer: Self-pay

## 2020-07-12 NOTE — Telephone Encounter (Signed)
Received fax from pharmacy, PA needed on Acyclovir Ointment. Clinical questions submitted via Cover My Meds. Waiting on response, could take up to 72 hours.  Cover My Meds info: Key: [id number]

## 2020-07-17 NOTE — Telephone Encounter (Signed)
Medication has been approved via covermymeds. LVM with pharmacy informing of this.

## 2020-08-17 ENCOUNTER — Ambulatory Visit: Payer: Medicaid Other | Admitting: Family Medicine

## 2020-09-01 ENCOUNTER — Ambulatory Visit: Payer: Medicaid Other | Admitting: Family Medicine

## 2020-12-23 ENCOUNTER — Encounter (HOSPITAL_COMMUNITY): Payer: Self-pay | Admitting: *Deleted

## 2020-12-23 ENCOUNTER — Emergency Department (HOSPITAL_COMMUNITY)
Admission: EM | Admit: 2020-12-23 | Discharge: 2020-12-23 | Disposition: A | Discharge status: Home / Self Care | Payer: Medicaid Other | Attending: Emergency Medicine | Admitting: Emergency Medicine

## 2020-12-23 ENCOUNTER — Other Ambulatory Visit: Payer: Self-pay

## 2020-12-23 DIAGNOSIS — R0981 Nasal congestion: Secondary | ICD-10-CM | POA: Diagnosis present

## 2020-12-23 DIAGNOSIS — R52 Pain, unspecified: Secondary | ICD-10-CM

## 2020-12-23 DIAGNOSIS — U071 COVID-19: Secondary | ICD-10-CM | POA: Diagnosis not present

## 2020-12-23 DIAGNOSIS — R55 Syncope and collapse: Secondary | ICD-10-CM | POA: Insufficient documentation

## 2020-12-23 DIAGNOSIS — J45909 Unspecified asthma, uncomplicated: Secondary | ICD-10-CM | POA: Diagnosis not present

## 2020-12-23 DIAGNOSIS — R531 Weakness: Secondary | ICD-10-CM | POA: Insufficient documentation

## 2020-12-23 DIAGNOSIS — Z91040 Latex allergy status: Secondary | ICD-10-CM | POA: Insufficient documentation

## 2020-12-23 DIAGNOSIS — Z5321 Procedure and treatment not carried out due to patient leaving prior to being seen by health care provider: Secondary | ICD-10-CM | POA: Insufficient documentation

## 2020-12-23 DIAGNOSIS — Z20822 Contact with and (suspected) exposure to covid-19: Secondary | ICD-10-CM

## 2020-12-23 LAB — POC SARS CORONAVIRUS 2 AG -  ED: SARS Coronavirus 2 Ag: NEGATIVE

## 2020-12-23 MED ORDER — ONDANSETRON 8 MG PO TBDP
8.0000 mg | ORAL_TABLET | Freq: Once | ORAL | Status: AC
  Administered 2020-12-23: 8 mg via ORAL
  Filled 2020-12-23: qty 1

## 2020-12-23 MED ORDER — OXYCODONE-ACETAMINOPHEN 5-325 MG PO TABS
2.0000 | ORAL_TABLET | Freq: Once | ORAL | Status: AC
  Administered 2020-12-23: 2 via ORAL
  Filled 2020-12-23: qty 2

## 2020-12-23 MED ORDER — ACETAMINOPHEN 325 MG PO TABS
650.0000 mg | ORAL_TABLET | Freq: Once | ORAL | Status: AC
  Administered 2020-12-23: 650 mg via ORAL
  Filled 2020-12-23: qty 2

## 2020-12-23 NOTE — ED Triage Notes (Signed)
Patient is from home and presents with syncope and weakness. She was tested for Covid at the hospital earlier today. When EMS arrived BP was 62/40 and HR 43. fluid was administered by EMS.   EMS vitals: 108/77 BP 58 HR 100% SPO2 on room air 125 CBG 98.8 Temp

## 2020-12-23 NOTE — ED Notes (Signed)
Pt turned in her pt labels to registration and requested her IV be taken out. IV removed and pt left.

## 2020-12-23 NOTE — ED Triage Notes (Signed)
Pt complains of sore thoat, nasal congestion, headache x 1 week. Generalized body aches and weekness since yesterday. Hx fibromyalgia.

## 2020-12-23 NOTE — ED Notes (Signed)
INFORMED PA WYLDER OF NEG COVID TEST POC

## 2020-12-23 NOTE — ED Notes (Signed)
External female catheter attached to patient 

## 2020-12-23 NOTE — ED Provider Notes (Signed)
Shelby Graves   CSN: [id number] Arrival date & time: 12/23/20  0909     History Chief Complaint  Patient presents with  . Generalized Body Aches  . Nasal Congestion  . Headache    Shelby Graves is a 30 y.o. female.  HPI     30 year old female history of anemia, asthma presents today complaining of diffuse body aches for 3 to 4 days.  She has had associated nasal congestion and sore throat.  She reports that her daughter's daycare reported a Covid exposure.  She has not been vaccinated.  Also complaining of headache.  She has had some nausea but has not had any vomiting or diarrhea.  Denies urinary tract infection symptoms  Past Medical History:  Diagnosis Date  . Anemia 2012  . Angio-edema   . Anxiety   . Arthritis   . Asthma 2009   last used inhaler 2009  . Depression   . H/O varicella   . 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 History of gonorrhea   . Hx of chlamydia infection   . Hyperventilation   . Migraine 2010  . PID (pelvic inflammatory disease) 2012   GC and chlamydia   . Urticaria     Patient Active Problem List   Diagnosis Date Noted  . Candida vaginitis 07/07/2020  . Exposure to STD 07/06/2020  . Other allergic rhinitis 03/29/2019  . Adverse food reaction 03/29/2019  . Lactose intolerance 03/29/2019  . Latex allergy 03/29/2019  . Shortness of breath 03/29/2019  . Allergies 02/17/2019  . Anxiety state 02/17/2019  . Right hip subluxation ([place]) 02/15/2019  . Lower abdominal pain 07/14/2018  . 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 DUB (dysfunctional uterine bleeding) 12/17/2017  . Healthcare maintenance 06/03/2017  . Axillary mass, left 01/08/2017  . 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 Chronic back pain 12/23/2014  . Bacterial vaginosis 09/03/2013  . Mood disorder ([place]) 12/02/2012  . IDA (iron deficiency anemia) 07/16/2012  . Vitamin D deficiency 07/16/2012    History  reviewed. No pertinent surgical history.   OB History    Gravida  2   Para  1   Term  1   Preterm  0   AB  1   Living  1     SAB  1   IAB  0   Ectopic  0   Multiple  0   Live Births  1           Family History  Problem Relation Age of Onset  . Hyperlipidemia Mother   . Hypertension Mother   . Kidney disease Mother   . Hypertension Father   . Hypertension Brother   . Hyperlipidemia Brother   . Diabetes Maternal Grandmother   . Diabetes Maternal Grandfather   . Cancer Maternal Aunt 38       Ovarian CA    Social History   Tobacco Use  . Smoking status: Former Smoker    Types: Cigarettes    Quit date: 10/21/2013    Years since quitting: 7.1  . Smokeless tobacco: Never Used  . Tobacco comment: quit yrs ago - OCC FOR 1 YEAR  Vaping Use  . Vaping Use: Never used  Substance Use Topics  . Alcohol use: Not Currently  . Drug use: Not Currently    Types: Marijuana    Home Medications Prior to Admission medications   Medication Sig Start Date End Date Taking? Authorizing Provider  acyclovir ointment (ZOVIRAX) 5 % Apply 1 application topically every 3 (three) hours. 07/06/20   Moses Manners, MD  Albuterol Sulfate, sensor, 108 (90 Base) MCG/ACT AEPB Inhale 2 puffs into the lungs every 4 (four) hours as needed. Patient not taking: Reported on 01/19/2020 03/29/19   Ellamae Sia, DO  AUVI-Q 0.3 MG/0.3ML SOAJ injection Inject 0.3 mLs (0.3 mg total) into the muscle as needed for anaphylaxis. Patient not taking: Reported on 01/19/2020 03/29/19   Ellamae Sia, DO  Calcium Carb-Cholecalciferol (CALCIUM 500 +D) 500-400 MG-UNIT TABS Take 1 tablet by mouth daily. 07/07/20   Moses Manners, MD  diclofenac Sodium (VOLTAREN) 1 % GEL Apply 2 g topically 4 (four) times daily. 04/03/17   [provider]  DULoxetine (CYMBALTA) 30 MG capsule Take 1 capsule (30 mg total) by mouth daily. For one week, then increase to 2 pills (60mg ) daily. 07/06/20   07/08/20, MD   ferrous sulfate 325 (65 FE) MG tablet Take 1 tablet (325 mg total) by mouth 2 (two) times daily with a meal. Patient not taking: Reported on 12/29/2019 07/14/18   07/16/18, DO  gabapentin (NEURONTIN) 100 MG capsule Take 1 capsule (100 mg total) by mouth 3 (three) times daily. 07/06/20   07/08/20, MD  mometasone (NASONEX) 50 MCG/ACT nasal spray Place 2 sprays into the nose daily as needed (congestion).    [provider]  montelukast (SINGULAIR) 10 MG tablet Take 1 tablet (10 mg total) by mouth at bedtime. Patient not taking: Reported on 01/19/2020 03/29/19   05/29/19, DO  Norethindrone-Ethinyl Estradiol-Fe Biphas (LO LOESTRIN FE) 1 MG-10 MCG / 10 MCG tablet Take 1 tablet by mouth daily. 03/02/20   Leftwich-Kirby, 05/02/20, CNM  Polyvinyl Alcohol-Povidone PF (REFRESH) 1.4-0.6 % SOLN Apply 1 drop to eye as needed (dry eyes).    [provider]  Prenat-FeCbn-FeAspGl-FA-Omega (OB COMPLETE PETITE) 35-5-1-200 MG CAPS Take 1 capsule by mouth daily.    [provider]  Vitamin D, Ergocalciferol, (DRISDOL) 1.25 MG (50000 UT) CAPS capsule Take 1 capsule (50,000 Units total) by mouth 2 (two) times a week. 12/31/18   03/01/19, MD    Allergies    Latex  Review of Systems   Review of Systems  All other systems reviewed and are negative.   Physical Exam Updated Vital Signs BP 119/67 (BP Location: Left Arm)   Pulse 69   Temp 99.6 F (37.6 C) (Oral)   Resp 17   LMP 11/25/2020   SpO2 97%   Physical Exam Vitals and nursing Graves reviewed.  Constitutional:      Appearance: She is well-developed.  HENT:     Head: Normocephalic.     Mouth/Throat:     Comments: Dry mucous membranes Eyes:     Extraocular Movements: Extraocular movements intact.  Cardiovascular:     Rate and Rhythm: Normal rate and regular rhythm.     Heart sounds: Normal heart sounds.  Pulmonary:     Effort: Pulmonary effort is normal.     Breath sounds: Normal breath sounds.   Abdominal:     General: Bowel sounds are normal.     Palpations: Abdomen is soft.  Musculoskeletal:        General: Normal range of motion.     Cervical back: Normal range of motion.  Skin:    General: Skin is warm and dry.  Neurological:     Mental Status: She is alert.  Sensory: No sensory deficit.     Motor: No weakness.     ED Results / Procedures / Treatments   Labs (all labs ordered are listed, but only abnormal results are displayed) Labs Reviewed  POC SARS CORONAVIRUS 2 AG -  ED    EKG None  Radiology No results found.  Procedures Procedures (including critical care time)  Medications Ordered in ED Medications  acetaminophen (TYLENOL) tablet 650 mg (has no administration in time range)    ED Course  I have reviewed the triage vital signs and the nursing notes.  Pertinent labs & imaging results that were available during my care of the patient were reviewed by me and considered in my medical decision making (see chart for details).    MDM Rules/Calculators/A&P                          30 year old female with diffuse body aches and exposure to Covid.  Point-of-care Covid test is negative.  Vital signs are stable.  PCR test ordered but were not resulted after patient's discharge.  Discussed return precautions and need for follow-up with patient.  She is given a work until Tuesday.  Discussed that she is not feeling better by then she needs to be reevaluated. Final Clinical Impression(s) / ED Diagnoses Final diagnoses:  Body aches  Exposure to COVID-19 virus    Rx / DC Orders ED Discharge Orders    None       Margarita Grizzle, MD 12/23/20 1342

## 2020-12-23 NOTE — Discharge Instructions (Signed)
Please quarantine until you have a negative Covid test and symptoms have improved or for 10 days. Return if you are worse at any time Please take over-the-counter pain medicine including Tylenol and ibuprofen for pain.

## 2020-12-24 LAB — SARS CORONAVIRUS 2 (TAT 6-24 HRS): SARS Coronavirus 2: POSITIVE — AB

## 2020-12-25 ENCOUNTER — Telehealth (INDEPENDENT_AMBULATORY_CARE_PROVIDER_SITE_OTHER): Payer: Medicaid Other | Admitting: Advanced Practice Midwife

## 2020-12-25 ENCOUNTER — Encounter: Payer: Self-pay | Admitting: Advanced Practice Midwife

## 2020-12-25 VITALS — BP 111/82 | HR 84

## 2020-12-25 DIAGNOSIS — Z3009 Encounter for other general counseling and advice on contraception: Secondary | ICD-10-CM

## 2020-12-25 DIAGNOSIS — R52 Pain, unspecified: Secondary | ICD-10-CM

## 2020-12-25 DIAGNOSIS — B9689 Other specified bacterial agents as the cause of diseases classified elsewhere: Secondary | ICD-10-CM

## 2020-12-25 DIAGNOSIS — B373 Candidiasis of vulva and vagina: Secondary | ICD-10-CM | POA: Diagnosis not present

## 2020-12-25 DIAGNOSIS — N760 Acute vaginitis: Secondary | ICD-10-CM | POA: Diagnosis not present

## 2020-12-25 DIAGNOSIS — U071 COVID-19: Secondary | ICD-10-CM

## 2020-12-25 DIAGNOSIS — B3731 Acute candidiasis of vulva and vagina: Secondary | ICD-10-CM

## 2020-12-25 MED ORDER — NORETHIN-ETH ESTRAD-FE BIPHAS 1 MG-10 MCG / 10 MCG PO TABS: 1.0000 | ORAL_TABLET | Freq: Every day | ORAL | 11 refills | Status: DC

## 2020-12-25 MED ORDER — METRONIDAZOLE 0.75 % VA GEL: 1.0000 | Freq: Every day | VAGINAL | 1 refills | Status: DC

## 2020-12-25 MED ORDER — FLUCONAZOLE 150 MG PO TABS: ORAL_TABLET | ORAL | 0 refills | Status: DC

## 2020-12-25 NOTE — Progress Notes (Signed)
Patient presents for Mychart for birth control follow up. Patient identified with 2 patient identifiers.

## 2020-12-25 NOTE — Progress Notes (Signed)
GYNECOLOGY VIRTUAL VISIT ENCOUNTER NOTE  Provider location: [facility] [place]   I connected with Shelby Graves on 12/25/20 at  2:40 PM EST by MyChart Video Encounter at home and verified that I am speaking with the correct person using two identifiers.   I discussed the limitations, risks, security and privacy concerns of performing an evaluation and management service virtually and the availability of in person appointments. I also discussed with the patient that there may be a patient responsible charge related to this service. The patient expressed understanding and agreed to proceed.   History:  Shelby Graves is a 30 y.o. G40P1011 female being evaluated today for contraceptive visit. She reports vaginal discharge with itching and odor x 1 week.       Past Medical History:  Diagnosis Date  . Anemia 2012  . Angio-edema   . Anxiety   . Arthritis   . Asthma 2009   last used inhaler 2009  . Depression   . H/O varicella   . 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 History of gonorrhea   . Hx of chlamydia infection   . Hyperventilation   . Migraine 2010  . PID (pelvic inflammatory disease) 2012   GC and chlamydia   . Urticaria    History reviewed. No pertinent surgical history. The following portions of the patient's history were reviewed and updated as appropriate: allergies, current medications, past family history, past medical history, past social history, past surgical history and problem list.   Health Maintenance:  Normal pap on 12/29/19.   Review of Systems:  Pertinent items noted in HPI and remainder of comprehensive ROS otherwise negative.  Physical Exam:   General:  Alert, oriented and cooperative. Patient appears to be in no acute distress.  Mental Status: Normal mood and affect. Normal behavior. Normal judgment and thought content.   Respiratory: Normal respiratory effort, no problems with respiration noted  Rest of physical  exam deferred due to type of encounter  Labs and Imaging Results for orders placed or performed during the hospital encounter of 12/23/20 (from the past 336 hour(s))  POC SARS Coronavirus 2 Ag-ED - Nasal Swab (BD Veritor Kit)   Collection Time: 12/23/20 11:59 AM  Result Value Ref Range   SARS Coronavirus 2 Ag NEGATIVE NEGATIVE  SARS CORONAVIRUS 2 (TAT 6-24 HRS) Nasopharyngeal Nasopharyngeal Swab   Collection Time: 12/23/20 12:56 PM   Specimen: Nasopharyngeal Swab  Result Value Ref Range   SARS Coronavirus 2 POSITIVE (A) NEGATIVE   No results found.     Assessment and Plan:      1. Vaginal candidiasis --Pt with hx frequent yeast infections with symptoms that started last week.   --Pt to test in office in 10+ days due to COVID positive test, will treat empirically today, follow up with testing for yeast and STD testing. - fluconazole (DIFLUCAN) 150 MG tablet; Take 1 tablet now and 1 tablet in 3 days  Dispense: 2 tablet; Refill: 0  2. Bacterial vaginosis --Pt with frequent yeast and BV, feels like she has both again.  Will treat with plan to follow up with testing. - metroNIDAZOLE (METROGEL) 0.75 % vaginal gel; Place 1 Applicatorful vaginally at bedtime. Apply one applicatorful to vagina at bedtime for 5 days  Dispense: 70 g; Refill: 1  3. Encounter for counseling regarding contraception --Pt menses improved and no pain with intercourse while taking OCPs.  Pt is taking pills continuously but has missed 1 pill and  took it the next day during the last pack.  LMP 12/10.   --Pt to take home UPT prior to starting new pack of pills, and any time she experiences pregnancy symptoms --Renew OCPs at this time with low risk of pregnancy - Norethindrone-Ethinyl Estradiol-Fe Biphas (LO LOESTRIN FE) 1 MG-10 MCG / 10 MCG tablet; Take 1 tablet by mouth daily.  Dispense: 30 tablet; Refill: 11  4. Lab test positive for detection of COVID-19 virus -Pt with mild respiratory symptoms and body aches with  COVID.    5. Generalized body aches --Pt has fibromyalgia and initially thought symptoms were her usual pain with dx.  Then when symptoms worsened, she was tested for COVID 12/23/20 and test was positive.   --Reviewed reasons to seek care --Recommend COVID vaccine and booster  I discussed the assessment and treatment plan with the patient. The patient was provided an opportunity to ask questions and all were answered. The patient agreed with the plan and demonstrated an understanding of the instructions.   The patient was advised to call back or seek an in-person evaluation/go to the ED if the symptoms worsen or if the condition fails to improve as anticipated.  I provided 10 minutes of face-to-face time during this encounter.   Fatima Blank, [place] for [employer], [place]

## 2021-01-04 ENCOUNTER — Other Ambulatory Visit (HOSPITAL_COMMUNITY)
Admission: RE | Admit: 2021-01-04 | Discharge: 2021-01-04 | Disposition: A | Discharge status: Home / Self Care | Payer: Medicaid Other | Source: Ambulatory Visit | Attending: Advanced Practice Midwife | Admitting: Advanced Practice Midwife

## 2021-01-04 ENCOUNTER — Ambulatory Visit (INDEPENDENT_AMBULATORY_CARE_PROVIDER_SITE_OTHER): Payer: Medicaid Other

## 2021-01-04 ENCOUNTER — Other Ambulatory Visit: Payer: Self-pay

## 2021-01-04 DIAGNOSIS — N898 Other specified noninflammatory disorders of vagina: Secondary | ICD-10-CM | POA: Diagnosis not present

## 2021-01-04 DIAGNOSIS — B373 Candidiasis of vulva and vagina: Secondary | ICD-10-CM | POA: Insufficient documentation

## 2021-01-04 DIAGNOSIS — B3789 Other sites of candidiasis: Secondary | ICD-10-CM | POA: Insufficient documentation

## 2021-01-04 DIAGNOSIS — N926 Irregular menstruation, unspecified: Secondary | ICD-10-CM | POA: Diagnosis not present

## 2021-01-04 LAB — POCT URINE PREGNANCY: Preg Test, Ur: NEGATIVE

## 2021-01-04 NOTE — Progress Notes (Signed)
Patient was assessed and managed by nursing staff during this encounter. I have reviewed the chart and agree with the documentation and plan. I have also made any necessary editorial changes.  Catalina Antigua, MD 01/04/2021 10:38 AM

## 2021-01-04 NOTE — Progress Notes (Signed)
Pt is in the office for self swab. Reports vaginal discharge Pt reports that she s/w provider on 12-25-20 and reported light spotting while on BC pills, last cycle 12-01-20. UPT is negative today.

## 2021-01-05 LAB — CERVICOVAGINAL ANCILLARY ONLY
Bacterial Vaginitis (gardnerella): NEGATIVE
Candida Glabrata: POSITIVE — AB
Candida Vaginitis: POSITIVE — AB
Chlamydia: NEGATIVE
Comment: NEGATIVE
Comment: NEGATIVE
Comment: NEGATIVE
Comment: NEGATIVE
Comment: NEGATIVE
Comment: NORMAL
Neisseria Gonorrhea: NEGATIVE
Trichomonas: NEGATIVE

## 2021-01-07 ENCOUNTER — Other Ambulatory Visit: Payer: Self-pay | Admitting: Advanced Practice Midwife

## 2021-01-07 DIAGNOSIS — B373 Candidiasis of vulva and vagina: Secondary | ICD-10-CM

## 2021-01-07 DIAGNOSIS — B3731 Acute candidiasis of vulva and vagina: Secondary | ICD-10-CM

## 2021-01-07 DIAGNOSIS — B379 Candidiasis, unspecified: Secondary | ICD-10-CM

## 2021-01-07 MED ORDER — TERCONAZOLE 0.4 % VA CREA: 1.0000 | TOPICAL_CREAM | Freq: Every day | VAGINAL | 2 refills | Status: DC

## 2021-01-07 MED ORDER — FLUCONAZOLE 200 MG PO TABS: 200.0000 mg | ORAL_TABLET | ORAL | 0 refills | Status: AC

## 2021-02-20 ENCOUNTER — Telehealth: Payer: Self-pay | Admitting: Allergy

## 2021-02-20 NOTE — Telephone Encounter (Signed)
Patient called and said that she had a reaction after eating banana and taco with sauce on it. She only used benadryl and did not use the epi-pen. I asked her if she needed to come in and she said no that she just wanted [place] to know what happen on sat.and to put it in her chart.

## 2021-02-20 NOTE — Telephone Encounter (Signed)
Patient has not been seen since April of 2020.Marland KitchenMarland KitchenMarland Kitchen

## 2021-02-20 NOTE — Telephone Encounter (Signed)
Okay noted.  And according to last visit she was supposed to avoid bananas.

## 2021-06-05 ENCOUNTER — Telehealth (INDEPENDENT_AMBULATORY_CARE_PROVIDER_SITE_OTHER): Payer: Medicaid Other | Admitting: Advanced Practice Midwife

## 2021-06-05 DIAGNOSIS — N939 Abnormal uterine and vaginal bleeding, unspecified: Secondary | ICD-10-CM | POA: Diagnosis not present

## 2021-06-05 DIAGNOSIS — L680 Hirsutism: Secondary | ICD-10-CM

## 2021-06-05 NOTE — Progress Notes (Signed)
GYNECOLOGY VIRTUAL VISIT ENCOUNTER NOTE  Provider location: [facility] for [facility]   Patient location: Home  I connected with Shelby Graves on 06/05/21 at  8:15 AM EDT by MyChart Video Encounter and verified that I am speaking with the correct person using two identifiers.   I discussed the limitations, risks, security and privacy concerns of performing an evaluation and management service virtually and the availability of in person appointments. I also discussed with the patient that there may be a patient responsible charge related to this service. The patient expressed understanding and agreed to proceed.   History:  Shelby Graves is a 30 y.o. G35P1011 female being evaluated today for irregular menses.  She reports irregular, heavy, painful menses since menarche.  In high school she went to the emergency room for severe pain and was found to have ovarian cysts. She started OCPs at that time.  The OCPs did shrink the cysts but she had side effects including headaches/migraines, vision changes and changes to her skin and hair. She was off hormonal contraception and kept a calendar to predict ovulation x 5 years successfully. She started having irregular spotting again and started OCPs in 2021.  She had some side effects and was lowered from Ortho Cyclen to Lo Loestrin. She reports she is having headaches and vision changes that make it hard to work.  She also reports an increase in hair growth. She reports some excessive hair growth normally that she manages with waxing, etc but on OCPs the hair has increased which is bothersome.  She is interested in stopping hormonal contraception again.     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 The following portions of the patient's history were reviewed and updated as appropriate: allergies, current medications, past family history, past medical history, past social history, past surgical history and problem list.   Health Maintenance:  Normal pap and negative HRHPV on 12/29/2019.  Normal mammogram on n/a.   Review of Systems:  Pertinent items noted in HPI and remainder of comprehensive ROS otherwise negative.  Physical Exam:   General:  Alert, oriented and cooperative. Patient appears to be in no acute distress.  Mental Status: Normal mood and affect. Normal behavior. Normal judgment and thought content.   Respiratory: Normal respiratory effort, no problems with respiration noted  Rest of physical exam deferred due to type of encounter  Labs and Imaging No results found for this or any previous visit (from the past 336 hour(s)). No results found.     Assessment and Plan:     1. Abnormal uterine bleeding (AUB) --Given irregular menses and pt report of excessive hair, also with the addition of history of bilateral ovarian cysts, PCOS is likely. --Plan for pt to f/u in office for labwork, further evaluation, and discussion of management options.  2. Excessive hair on females --Worse with OCPs --Discussed spironolactone for symptom management. Pt to consider.      I discussed the assessment and  treatment plan with the patient. The patient was provided an opportunity to ask questions and all were answered. The patient agreed with the plan and demonstrated an understanding of the instructions.   The patient was advised to call back or seek an in-person evaluation/go to the ED if the symptoms worsen or if the condition fails to improve as anticipated.  I provided 10 minutes of face-to-face time during this encounter.   Sharen Counter, CNM [facility] for [employer], [facility]

## 2021-06-11 ENCOUNTER — Ambulatory Visit: Payer: Medicaid Other | Admitting: Advanced Practice Midwife

## 2021-06-12 ENCOUNTER — Encounter: Payer: Self-pay | Admitting: Obstetrics and Gynecology

## 2021-06-12 ENCOUNTER — Ambulatory Visit (INDEPENDENT_AMBULATORY_CARE_PROVIDER_SITE_OTHER): Payer: Medicaid Other | Admitting: Obstetrics and Gynecology

## 2021-06-12 ENCOUNTER — Other Ambulatory Visit (HOSPITAL_COMMUNITY)
Admission: RE | Admit: 2021-06-12 | Discharge: 2021-06-12 | Disposition: A | Discharge status: Home / Self Care | Payer: Medicaid Other | Source: Ambulatory Visit | Attending: Obstetrics and Gynecology | Admitting: Obstetrics and Gynecology

## 2021-06-12 ENCOUNTER — Ambulatory Visit: Payer: Medicaid Other

## 2021-06-12 ENCOUNTER — Other Ambulatory Visit: Payer: Self-pay

## 2021-06-12 VITALS — BP 98/67 | HR 88 | Ht 65.0 in | Wt 153.0 lb

## 2021-06-12 DIAGNOSIS — N939 Abnormal uterine and vaginal bleeding, unspecified: Secondary | ICD-10-CM

## 2021-06-12 DIAGNOSIS — Z202 Contact with and (suspected) exposure to infections with a predominantly sexual mode of transmission: Secondary | ICD-10-CM | POA: Insufficient documentation

## 2021-06-12 DIAGNOSIS — N3000 Acute cystitis without hematuria: Secondary | ICD-10-CM | POA: Diagnosis not present

## 2021-06-12 DIAGNOSIS — N390 Urinary tract infection, site not specified: Secondary | ICD-10-CM | POA: Insufficient documentation

## 2021-06-12 LAB — POCT URINALYSIS DIPSTICK
Blood, UA: POSITIVE
Glucose, UA: NEGATIVE
Nitrite, UA: NEGATIVE
Protein, UA: POSITIVE — AB
Spec Grav, UA: >=1.03 — AB (ref 1.010–1.025)
Urobilinogen, UA: 0.2 E.U./dL
pH, UA: 5 (ref 5.0–8.0)

## 2021-06-12 MED ORDER — CEPHALEXIN 500 MG PO CAPS
500.0000 mg | ORAL_CAPSULE | Freq: Three times a day (TID) | ORAL | 0 refills | Status: DC
Start: 2021-06-12 — End: 2021-08-20

## 2021-06-12 NOTE — Patient Instructions (Signed)
Abnormal Uterine Bleeding Abnormal uterine bleeding means bleeding more than usual from your womb (uterus). It can include: Bleeding between menstrual periods. Bleeding after sex. Bleeding that is heavier than normal. Menstrual periods that last longer than usual. Bleeding after you have stopped having your menstrual period (menopause). There are many problems that may cause this. You should see a doctor for any kind of bleeding that is not normal. Treatment depends on the cause of the bleeding. Follow these instructions at home: Medicines Take over-the-counter and prescription medicines only as told by your doctor. Tell your doctor about other medicines that you take. If told by your doctor, stop taking aspirin or medicines that have aspirin in them. These medicines can make you bleed more. You may be given iron pills to replace iron that your body loses because of this condition. Take them as told by your doctor. Managing constipation If you are taking iron pills, you may have trouble pooping (constipation). To prevent or treat trouble pooping, you may need to: Drink enough fluid to keep your pee (urine) pale yellow. Take over-the-counter or prescription medicines. Eat foods that are high in fiber. These include beans, whole grains, and fresh fruits and vegetables. Limit foods that are high in fat and sugar. These include fried or sweet foods. General instructions Watch your condition for any changes. Do not use tampons, douche, or have sex, if your doctor tells you not to. Change your pads often. Get regular exams. This includes pelvic exams and cervical cancer screenings. It is up to you to get the results of any tests that are done. Ask your doctor, or the department that is doing the tests, when your results will be ready. Keep all follow-up visits as told by your doctor. This is important. Contact a doctor if: The bleeding lasts more than 1 week. You feel dizzy at times. You feel  like you may vomit (nausea). You vomit. You feel light-headed or weak. Your symptoms get worse. Get help right away if: You pass out. You have to change pads every hour. You have pain in your belly. You have a fever or chills. You get sweaty. You get weak. You pass large blood clots from your vagina. Summary Abnormal uterine bleeding means bleeding more than usual from your womb (uterus). Any kind of bleeding that is not normal should be checked by a doctor. Treatment depends on the cause of the bleeding. Get help right away if you pass out, you have to change pads every hour, or you pass large blood clots from your vagina. This information is not intended to replace advice given to you by your health care provider. Make sure you discuss any questions you have with your health care provider. Document Revised: 10/12/2019 Document Reviewed: 10/12/2019 Elsevier Patient Education  2022 Elsevier Inc.  

## 2021-06-12 NOTE — Progress Notes (Addendum)
GYN presents for FU BC side effects and c/o pain durning and after sex 7/10 x 5 days, bloating, cramping, passing pinkish yellow tissue.  Patient requested Full STD screening.
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
## 2021-06-14 ENCOUNTER — Other Ambulatory Visit: Payer: Self-pay

## 2021-06-14 DIAGNOSIS — N760 Acute vaginitis: Secondary | ICD-10-CM

## 2021-06-14 DIAGNOSIS — B9689 Other specified bacterial agents as the cause of diseases classified elsewhere: Secondary | ICD-10-CM

## 2021-06-14 DIAGNOSIS — B379 Candidiasis, unspecified: Secondary | ICD-10-CM

## 2021-06-14 LAB — CERVICOVAGINAL ANCILLARY ONLY
Bacterial Vaginitis (gardnerella): POSITIVE — AB
Candida Glabrata: NEGATIVE
Candida Vaginitis: POSITIVE — AB
Chlamydia: NEGATIVE
Comment: NEGATIVE
Comment: NEGATIVE
Comment: NEGATIVE
Comment: NEGATIVE
Comment: NEGATIVE
Comment: NORMAL
Neisseria Gonorrhea: NEGATIVE
Trichomonas: NEGATIVE

## 2021-06-14 MED ORDER — FLUCONAZOLE 150 MG PO TABS: 150.0000 mg | ORAL_TABLET | Freq: Once | ORAL | 0 refills | Status: AC

## 2021-06-14 MED ORDER — METRONIDAZOLE 500 MG PO TABS: 500.0000 mg | ORAL_TABLET | Freq: Two times a day (BID) | ORAL | 0 refills | Status: DC

## 2021-06-15 LAB — TESTT+TESTF+SHBG
Sex Hormone Binding: 51.7 nmol/L (ref 24.6–122.0)
Testosterone, Free: 0.5 pg/mL (ref 0.0–4.2)
Testosterone, Total, LC/MS: 8.2 ng/dL — ABNORMAL LOW (ref 10.0–55.0)

## 2021-06-15 LAB — RPR+HBSAG+HCVAB+...
HIV Screen 4th Generation wRfx: NONREACTIVE
Hep C Virus Ab: <0.1 s/co ratio (ref 0.0–0.9)
Hepatitis B Surface Ag: NEGATIVE
RPR Ser Ql: NONREACTIVE

## 2021-06-15 LAB — TSH: TSH: 1.27 u[IU]/mL (ref 0.450–4.500)

## 2021-06-15 LAB — HEMOGLOBIN A1C
Est. average glucose Bld gHb Est-mCnc: 111 mg/dL
Hgb A1c MFr Bld: 5.5 % (ref 4.8–5.6)

## 2021-06-15 LAB — URINE CULTURE
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
## 2021-06-23 ENCOUNTER — Other Ambulatory Visit: Payer: Self-pay | Admitting: Family Medicine

## 2021-06-30 ENCOUNTER — Other Ambulatory Visit: Payer: Self-pay | Admitting: Family Medicine

## 2021-08-10 ENCOUNTER — Ambulatory Visit: Payer: Medicaid Other | Admitting: Nurse Practitioner

## 2021-08-20 ENCOUNTER — Other Ambulatory Visit: Payer: Self-pay

## 2021-08-20 ENCOUNTER — Encounter: Payer: Self-pay | Admitting: Obstetrics

## 2021-08-20 ENCOUNTER — Other Ambulatory Visit (HOSPITAL_COMMUNITY)
Admission: RE | Admit: 2021-08-20 | Discharge: 2021-08-20 | Disposition: A | Discharge status: Home / Self Care | Payer: Medicaid Other | Source: Ambulatory Visit | Attending: Obstetrics | Admitting: Obstetrics

## 2021-08-20 ENCOUNTER — Ambulatory Visit (INDEPENDENT_AMBULATORY_CARE_PROVIDER_SITE_OTHER): Payer: Medicaid Other | Admitting: Obstetrics

## 2021-08-20 VITALS — BP 112/74 | HR 101 | Ht 65.0 in | Wt 149.0 lb

## 2021-08-20 DIAGNOSIS — N898 Other specified noninflammatory disorders of vagina: Secondary | ICD-10-CM | POA: Insufficient documentation

## 2021-08-20 DIAGNOSIS — D508 Other iron deficiency anemias: Secondary | ICD-10-CM

## 2021-08-20 DIAGNOSIS — Z01419 Encounter for gynecological examination (general) (routine) without abnormal findings: Secondary | ICD-10-CM | POA: Diagnosis present

## 2021-08-20 MED ORDER — AZO BORIC ACID 600 MG VA SUPP: 1.0000 | Freq: Every evening | VAGINAL | 5 refills | Status: AC

## 2021-08-20 MED ORDER — OB COMPLETE PETITE 35-5-1-200 MG PO CAPS: 1.0000 | ORAL_CAPSULE | Freq: Every day | ORAL | 11 refills | Status: AC

## 2021-08-20 NOTE — Progress Notes (Signed)
Pt state that she frequent BV and yeast.  Pt states she has one partner and is concerned he may need to be treated as well.

## 2021-08-20 NOTE — Progress Notes (Signed)
Subjective:        Shelby Graves is a 30 y.o. female here for a routine exam.  Current complaints: Recurrent BV and yeast infections.    Personal health questionnaire:  Is patient Ashkenazi Jewish, have a family history of breast and/or ovarian cancer: no Is there a family history of uterine cancer diagnosed at age < 19, gastrointestinal cancer, urinary tract cancer, family member who is a Personnel officer syndrome-associated carrier: no Is the patient overweight and hypertensive, family history of diabetes, personal history of gestational diabetes, preeclampsia or PCOS: no Is patient over 30, have PCOS,  family history of premature CHD under age 59, diabetes, smoke, have hypertension or peripheral artery disease:  no At any time, has a partner hit, kicked or otherwise hurt or frightened you?: no Over the past 2 weeks, have you felt down, depressed or hopeless?: no Over the past 2 weeks, have you felt little interest or pleasure in doing things?:no   Gynecologic History Patient's last menstrual period was 08/06/2021. Contraception: condoms Last Pap: 12-29-2019. Results were: normal Last mammogram: n/a. 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 No pertinent surgical history.   Current Outpatient Medications:    Boric Acid Vaginal (AZO BORIC ACID) 600 MG  SUPP, Place 1 suppository vaginally at bedtime. For 14 days, monthly after the period for 6 months, Disp: 30 suppository, Rfl: 5   DULoxetine (CYMBALTA) 30 MG capsule, Take 1 capsule (30 mg total) by mouth daily. For one week, then increase to 2 pills (60mg ) daily., Disp: 60 capsule, Rfl: 12   gabapentin (NEURONTIN) 100 MG capsule, Take 1 capsule (100 mg total) by mouth 3 (three) times daily., Disp: 90 capsule, Rfl: 3   mometasone (NASONEX) 50 MCG/ACT nasal spray, Place 2 sprays into the nose daily as needed (congestion)., Disp: , Rfl:    acyclovir ointment (ZOVIRAX) 5 %, Apply 1 application topically every 3 (three) hours., Disp: 30 g, Rfl: 6   Calcium Carb-Cholecalciferol (CALCIUM 500 +D) 500-400 MG-UNIT TABS, Take 1 tablet by mouth daily. (Patient not taking: Reported on 08/20/2021), Disp: 60 tablet, Rfl:    diclofenac Sodium (VOLTAREN) 1 % GEL, Apply 2 g topically 4 (four) times daily. (Patient not taking: Reported on 08/20/2021), Disp: , Rfl:    metroNIDAZOLE (FLAGYL) 500 MG tablet, Take 1 tablet (500 mg total) by mouth 2 (two) times daily. (Patient not taking: Reported on 08/20/2021), Disp: 14 tablet, Rfl: 0   Norethindrone-Ethinyl Estradiol-Fe Biphas (LO LOESTRIN FE) 1 MG-10 MCG / 10 MCG tablet, Take 1 tablet by mouth daily. (Patient not  taking: Reported on 08/20/2021), Disp: 30 tablet, Rfl: 11   Polyvinyl Alcohol-Povidone PF (REFRESH) 1.4-0.6 % SOLN, Apply 1 drop to eye as needed (dry eyes)., Disp: , Rfl:    Prenat-FeCbn-FeAspGl-FA-Omega (OB COMPLETE PETITE) 35-5-1-200 MG CAPS, Take 1 capsule by mouth daily., Disp: , Rfl:    Vitamin D, Ergocalciferol, (DRISDOL) 1.25 MG (50000 UT) CAPS capsule, Take 1 capsule (50,000 Units total) by mouth 2 (two) times a week., Disp: 24 capsule, Rfl: 0 Allergies  Allergen Reactions   Influenza Vaccines Anaphylaxis   Avocado Hives    Other reaction(s): Angioedema (ALLERGY/intolerance)   Banana Itching   Kiwi Extract Hives    Other reaction(s): Angioedema  (ALLERGY/intolerance)   Latex Hives    Social History   Tobacco Use   Smoking status: Former    Types: Cigarettes    Quit date: 10/21/2013    Years since quitting: 7.8   Smokeless tobacco: Never   Tobacco comments:    quit yrs ago - OCC FOR 1 YEAR  Substance Use Topics   Alcohol use: Not Currently    Family History  Problem Relation Age of Onset   Hyperlipidemia Mother    Hypertension Mother    Kidney disease Mother    Hypertension Father    Hypertension Brother    Hyperlipidemia Brother    Diabetes Maternal Grandmother    Diabetes Maternal Grandfather    Cancer Maternal Aunt 69       Ovarian CA      Review of Systems  Constitutional: negative for fatigue and weight loss Respiratory: negative for cough and wheezing Cardiovascular: negative for chest pain, fatigue and palpitations Gastrointestinal: negative for abdominal pain and change in bowel habits Musculoskeletal:negative for myalgias Neurological: negative for gait problems and tremors Behavioral/Psych: negative for abusive relationship, depression Endocrine: negative for temperature intolerance    Genitourinary:negative for abnormal menstrual periods, genital lesions, hot flashes, sexual problems and vaginal discharge Integument/breast: negative for breast lump, breast tenderness, nipple discharge and skin lesion(s)    Objective:       BP 112/74   Pulse (!) 101   Ht 5\' 5"  (1.651 m)   Wt 149 lb (67.6 kg)   LMP 08/06/2021   BMI 24.79 kg/m  General:   Alert and no distress  Skin:   no rash or abnormalities  Lungs:   clear to auscultation bilaterally  Heart:   regular rate and rhythm, S1, S2 normal, no murmur, click, rub or gallop  Breasts:   normal without suspicious masses, skin or nipple changes or axillary nodes  Abdomen:  normal findings: no organomegaly, soft, non-tender and no hernia  Pelvis:  External genitalia: normal general appearance Urinary system: urethral meatus normal and bladder without  fullness, nontender Vaginal: normal without tenderness, induration or masses Cervix: normal appearance Adnexa: normal bimanual exam Uterus: anteverted and non-tender, normal size   Lab Review Urine pregnancy test Labs reviewed yes Radiologic studies reviewed no  I have spent a total of 20 minutes of face-to-face time, excluding clinical staff time, reviewing notes and preparing to see patient, ordering tests and/or medications, and counseling the patient.   Assessment:    1. Encounter for routine gynecological examination with Papanicolaou smear of cervix Rx: - Cytology - PAP( [place]) - POCT urinalysis dipstick  2. Vaginal discharge.  History of recurrent BV - will start chronic BV treatment regimen with Boric Acid supp  Rx: - Cervicovaginal ancillary only( [facility]) - Boric Acid Vaginal (AZO BORIC ACID) 600 MG SUPP; Place  1 suppository vaginally at bedtime. For 14 days, monthly after the period for 6 months  Dispense: 30 suppository; Refill: 5  3. Iron deficiency anemia secondary to inadequate dietary iron intake Rx: - Prenat-FeCbn-FeAspGl-FA-Omega (OB COMPLETE PETITE) 35-5-1-200 MG CAPS; Take 1 capsule by mouth daily.  Dispense: 30 capsule; Refill: 11     Plan:    Education reviewed: calcium supplements, depression evaluation, low fat, low cholesterol diet, safe sex/STD prevention, self breast exams, and weight bearing exercise. Contraception: none. Follow up in: 6 months.   Meds ordered this encounter  Medications   Boric Acid Vaginal (AZO BORIC ACID) 600 MG SUPP    Sig: Place 1 suppository vaginally at bedtime. For 14 days, monthly after the period for 6 months    Dispense:  30 suppository    Refill:  5   Orders Placed This Encounter  Procedures   POCT urinalysis dipstick     Brock Bad, MD 08/20/2021 4:31 PM

## 2021-08-21 LAB — POCT URINALYSIS DIPSTICK
Bilirubin, UA: NEGATIVE
Blood, UA: NEGATIVE
Glucose, UA: NEGATIVE
Ketones, UA: NEGATIVE
Leukocytes, UA: NEGATIVE
Nitrite, UA: NEGATIVE
Protein, UA: NEGATIVE
Spec Grav, UA: 1.01 (ref 1.010–1.025)
Urobilinogen, UA: 0.2 E.U./dL
pH, UA: 7 (ref 5.0–8.0)

## 2021-08-21 LAB — CERVICOVAGINAL ANCILLARY ONLY
Bacterial Vaginitis (gardnerella): POSITIVE — AB
Candida Glabrata: NEGATIVE
Candida Vaginitis: NEGATIVE
Chlamydia: NEGATIVE
Comment: NEGATIVE
Comment: NEGATIVE
Comment: NEGATIVE
Comment: NEGATIVE
Comment: NEGATIVE
Comment: NORMAL
Neisseria Gonorrhea: NEGATIVE
Trichomonas: NEGATIVE

## 2021-08-22 ENCOUNTER — Encounter: Payer: Self-pay | Admitting: *Deleted

## 2021-08-22 ENCOUNTER — Other Ambulatory Visit: Payer: Self-pay | Admitting: Obstetrics

## 2021-08-22 LAB — CYTOLOGY - PAP
Diagnosis: NEGATIVE
Diagnosis: REACTIVE

## 2021-08-22 NOTE — Progress Notes (Signed)
Message sent in MyChart. Patient education on BV included in MyChart message.

## 2022-09-09 ENCOUNTER — Encounter: Payer: Self-pay | Admitting: Advanced Practice Midwife

## 2022-09-09 ENCOUNTER — Other Ambulatory Visit (HOSPITAL_COMMUNITY)
Admission: RE | Admit: 2022-09-09 | Discharge: 2022-09-09 | Disposition: A | Discharge status: Home / Self Care | Payer: Medicaid Other | Source: Ambulatory Visit | Attending: Advanced Practice Midwife | Admitting: Advanced Practice Midwife

## 2022-09-09 ENCOUNTER — Ambulatory Visit (INDEPENDENT_AMBULATORY_CARE_PROVIDER_SITE_OTHER): Payer: Medicaid Other | Admitting: Advanced Practice Midwife

## 2022-09-09 VITALS — BP 104/66 | HR 92 | Ht 65.0 in | Wt 143.2 lb

## 2022-09-09 DIAGNOSIS — Z01419 Encounter for gynecological examination (general) (routine) without abnormal findings: Secondary | ICD-10-CM

## 2022-09-09 DIAGNOSIS — Z3009 Encounter for other general counseling and advice on contraception: Secondary | ICD-10-CM

## 2022-09-09 DIAGNOSIS — Z113 Encounter for screening for infections with a predominantly sexual mode of transmission: Secondary | ICD-10-CM

## 2022-09-09 DIAGNOSIS — N6321 Unspecified lump in the left breast, upper outer quadrant: Secondary | ICD-10-CM | POA: Diagnosis not present

## 2022-09-09 DIAGNOSIS — N939 Abnormal uterine and vaginal bleeding, unspecified: Secondary | ICD-10-CM | POA: Diagnosis not present

## 2022-09-09 LAB — POCT URINE PREGNANCY: Preg Test, Ur: NEGATIVE

## 2022-09-09 MED ORDER — CAYA VA DPRH: 1.0000 | VAGINAL_INSERT | Freq: Every day | VAGINAL | 0 refills | Status: AC | PRN

## 2022-09-09 NOTE — Progress Notes (Signed)
Subjective:     Shelby Graves is a 31 y.o. female here at [facility] for a routine exam.  Current complaints: irregular menses, occurs monthly but varies from 4 days early to 7 days late.  Menses affected by diet, and are less regular when eating meat and other foods. She does not desire pregnancy but has side effects from hormones and does not want hormonal contraception.  Personal health questionnaire reviewed: yes.  Do you have a primary care provider? yes Do you feel safe at home? yes  [place] Visit from 09/09/2022 in [place]  PHQ-2 Total Score 0       Health Maintenance Due  Topic Date Due   COVID-19 Vaccine (1) Never done   INFLUENZA VACCINE  07/23/2022     Risk factors for chronic health problems: Smoking: Alchohol/how much: Pt BMI: Body mass index is 23.83 kg/m.   Gynecologic History Patient's last menstrual period was 08/12/2022 (exact date). Contraception: none Last Pap: 2022. Results were: normal Last mammogram: n/a.  Obstetric History OB History  Gravida Para Term Preterm AB Living  2 1 1  0 1 1  SAB IAB Ectopic Multiple Live Births  1 0 0 0 1    # Outcome Date GA Lbr Len/2nd Weight Sex Delivery Anes PTL Lv  2 SAB 03/23/20          1 Term 12/2016    F Vag-Spont   LIV     The following portions of the patient's history were reviewed and updated as appropriate: allergies, current medications, past family history, past medical history, past social history, past surgical history, and problem list.  Review of Systems Pertinent items noted in HPI and remainder of comprehensive ROS otherwise negative.    Objective:   BP 104/66   Pulse 92   Ht 5\' 5"  (1.651 m)   Wt 143 lb 3.2 oz (65 kg)   LMP 08/12/2022 (Exact Date)   BMI 23.83 kg/m  VS reviewed, nursing note reviewed,  Constitutional: well developed, well nourished, no distress HEENT: normocephalic CV: normal rate Pulm/chest wall: normal effort Breast  Exam:   exam performed: right breast normal without mass, skin or nipple changes or axillary nodes, left breast normal with one small smooth round mass in axillary tail, upper outer quadrant of breast, cystic vs lymph node enlargement,  no skin or nipple changes  Abdomen: soft Neuro: alert and oriented x 3 Skin: warm, dry Psych: affect normal Pelvic exam:SSE deferred Bimanual exam: Cervix 0/long/high, firm, anterior, neg CMT, uterus nontender, nonenlarged, adnexa without tenderness, enlargement, or mass       Assessment/Plan:   1. Women's annual routine gynecological examination  - Cervicovaginal ancillary only( [place]) - POCT urine pregnancy  2. Routine screening for STI (sexually transmitted infection) --Pt had serum testing with PCP recently  3. Abnormal uterine bleeding (AUB) --Pt has hx irregular menses, was irregular after Depo and OCPs for months/years.  Discussed how long contraceptives should affect menses, but pt menstrual cycle is reported to be affected by diet and medications, so may be sensitive to changes like contraceptives and pregnancy for longer than average.  --Pt wants to take a vitamin that helped her have regular periods in the past, but she became pregnant taking the supplement last time and does not desire pregnancy --See contraceptive counseling below  4. Encounter for counseling regarding contraception --Discussed pt contraceptive plans and reviewed contraceptive methods based on pt preferences and effectiveness.  Pt  prefers  to try diaphragm, and to start OTC supplement that regulates her menses. --Pt is unsure of placing the device vaginally but willing to try to avoid hormones but prevent pregnancy  - Diaphragm [place] ([facility]) [address]; Place 1 Device vaginally daily as needed.  Dispense: 1 each; Refill: 0  5. Mass of upper outer quadrant of left breast --Cyst vs enlarged lymph node, pt with hx autoimmune issues and enlarged lymph nodes in other  areas  - US BREAST ASPIRATION LEFT; Future     Return in about 1 year (around 09/10/2023) for annual exam.   Fatima Blank, CNM 1:06 PM

## 2022-09-09 NOTE — Progress Notes (Signed)
Patient presents for AEX. Last Pap: 08/20/2021  Last Mammogram: Not Due, notes swollen armpit on LEFT Contraception: NONE Vaginal/Urinary Symptoms:  STD Screen: Desires swab, declines blood work, had done in July Other Concerns: Irregular cycles, has concerns

## 2022-09-10 LAB — CERVICOVAGINAL ANCILLARY ONLY
Chlamydia: NEGATIVE
Comment: NEGATIVE
Comment: NEGATIVE
Comment: NORMAL
Neisseria Gonorrhea: NEGATIVE
Trichomonas: NEGATIVE

## 2024-07-16 ENCOUNTER — Ambulatory Visit

## 2024-08-24 ENCOUNTER — Other Ambulatory Visit (HOSPITAL_COMMUNITY)
Admission: RE | Admit: 2024-08-24 | Discharge: 2024-08-24 | Disposition: A | Discharge status: Home / Self Care | Source: Ambulatory Visit | Attending: Obstetrics | Admitting: Obstetrics

## 2024-08-24 ENCOUNTER — Ambulatory Visit (INDEPENDENT_AMBULATORY_CARE_PROVIDER_SITE_OTHER)

## 2024-08-24 VITALS — BP 110/61 | HR 95 | Ht 65.0 in | Wt 157.0 lb

## 2024-08-24 DIAGNOSIS — N939 Abnormal uterine and vaginal bleeding, unspecified: Secondary | ICD-10-CM | POA: Diagnosis not present

## 2024-08-24 DIAGNOSIS — N888 Other specified noninflammatory disorders of cervix uteri: Secondary | ICD-10-CM

## 2024-08-24 DIAGNOSIS — Z01419 Encounter for gynecological examination (general) (routine) without abnormal findings: Secondary | ICD-10-CM

## 2024-08-24 NOTE — Progress Notes (Signed)
 Pt presents for AEX  Last PAP 07/2021 Requesting PAP and STD testing   Pt report irregular periods, bleeding between cycles and has concerns about PCOS .

## 2024-08-24 NOTE — Patient Instructions (Signed)
-   I changed your blood work to be drawn at an outpatient lab at your convenience.

## 2024-08-24 NOTE — Progress Notes (Signed)
 ANNUAL EXAM Patient name: Shelby Graves MRN [id number]  Date of birth: 1991-07-14 Chief Complaint:   Gynecologic Exam  History of Present Illness:   Shelby Graves is a 33 y.o. G2P1011 being seen today for a routine annual exam.   Current complaints: change in menses - irregular periods, sometimes a few days early or late - BTB/spotting - reports spotting around ovulation - sometimes uses chastberry supplement to manage bleeding - notices change in discharge, sometimes around ovulation, usually itchy after menses with h/o frequent BV but not always using boric acid suppository - painful intercourse - interfering with cycle tracking/NFP; does not desire pregnancy currently. Has not like contraceptives in the past - using tracking app - reports previous w/u was borderline for PCOS, experienced worsening hair growth on OCP  CCS - reports Pap in 2021 was abn and repeated 2022 for this reason   No LMP recorded.   The pregnancy intention screening data noted above was reviewed. Potential methods of contraception were discussed. The patient elected to proceed with No data recorded.   Last pap  Result Date Procedure Results Follow-ups  08/20/2021 Cytology - PAP( [place]) Pap Smear: NILM Adequacy: Satisfactory for evaluation; transformation zone component PRESENT. Diagnosis: - Negative for Intraepithelial Lesions or Malignancy (NILM) Diagnosis: - Benign reactive/reparative changes Pap in 3 years: due 08/20/2024  12/29/2019 Cytology - PAP( [place]) Pap Smear: NILM Neisseria Gonorrhea: Negative Chlamydia: Negative Trichomonas: Negative Adequacy: Satisfactory for evaluation; transformation zone component PRESENT. Diagnosis: - Negative for intraepithelial lesion or malignancy (NILM) Comment: Normal Reference [place] Chlamydia - Negative Comment: Normal Reference Range Neisseria Gonorrhea - Negative Comment: Normal Reference Range Trichomonas - Negative Pap in 3 years: due  12/28/2022  06/03/2017 Cytology - PAP [place] Pap Smear: NILM Adequacy: Satisfactory for evaluation  endocervical/transformation zone component PRESENT. Diagnosis: NEGATIVE FOR INTRAEPITHELIAL LESIONS OR MALIGNANCY. Chlamydia: Negative Neisseria Gonorrhea: Negative Material Submitted: CervicoVaginal Pap [ThinPrep Imaged] Pap in 3 years: due 06/03/2020  04/26/2014 Cytology - PAP Pap Smear: NILM Pap in 3 years: due 04/26/2017   Last mammogram: reports breast US  last year at outside facility.  Last colonoscopy: n/a.  Flowsheet Row Office Visit from 09/09/2022 in [facility]  PHQ-9 Total Score 0      09/09/2022    8:26 AM 02/16/2019    4:40 PM 02/16/2019    4:07 PM 07/14/2018    1:56 PM  GAD 7 : Generalized Anxiety Score  Nervous, Anxious, on Edge 0 1 1 3   Control/stop worrying 0 1 1 2   Worry too much - different things 0 2 2 3   Trouble relaxing 0 1 1 2   Restless 0 1 1 2   Easily annoyed or irritable  2 2 3   Afraid - awful might happen 0 1 1 2   Total GAD 7 Score  9 9 17   Anxiety Difficulty  Somewhat difficult Somewhat difficult Very difficult    Review of Systems:   Pertinent items are noted in HPI Denies any headaches, blurred vision, fatigue, shortness of breath, chest pain, abdominal pain, abnormal vaginal discharge/itching/odor/irritation, problems with periods, bowel movements, urination, or intercourse unless otherwise stated above. Pertinent History Reviewed:  Reviewed past medical,surgical, social and family history.  Reviewed problem list, medications and allergies. Physical Assessment:   Vitals:   08/24/24 1324  BP: 110/61  Pulse: 95  Weight: 157 lb (71.2 kg)  Height: 5' 5 (1.651 m)  Body mass index is 26.13 kg/m.  Physical Examination:   General appearance - well appearing, alert, and in no distress  Skin - warm and dry, normal color, no suspicious lesions noted  Chest - effort normal  Heart - normal rate  Breasts -  breasts appear normal, no suspicious masses, no skin or nipple changes or axillary nodes  Pelvic - VULVA: normal appearing vulva with no masses, tenderness or lesions   VAGINA: normal appearing vagina with normal color and discharge, no lesions   CERVIX: normal appearing cervix without discharge or lesions. Cervical bleeding after cytobroom collection. Thin prep pap is done with HR HPV cotesting  Extremities:  No swelling or varicosities noted  Chaperone present for exam  No results found for this or any previous visit (from the past 24 hours).  Assessment & Plan:  1. Women's annual routine gynecological examination (Primary) Cervical cancer screening today. STI swabs collected, bloodwork to be done outpatient.  Mammogram: @ 31yo, or sooner if problems Colonoscopy: @ 31yo, or sooner if problems - Cytology - PAP( [place]) - HIV antibody (with reflex); Future - RPR; Future - Hepatitis B Surface AntiGEN; Future - Hepatitis C Antibody; Future - Cervicovaginal ancillary only( [place]) - Hepatitis C Antibody - Hepatitis B Surface AntiGEN - RPR - HIV antibody (with reflex)  2. Abnormal uterine bleeding (AUB) 3. Friable cervix Ddx: breakthrough bleeding, cervical polyp, hyperplasia, cervicitis. No obvious lesion noted on speculum exam. Do not suspect PCOS, particularly in the absence of oligomenorrhea. Consider TVUS to visualize structural causes of bleeding. Consider medication to stabilize endometrium, though she has h/o adverse effects to both OCP and progestin-only contraceptive; would discuss risk/benefits of treatments/effects. Advised she continue noting bleeding, spotting, and other symptoms and f/u as problem-based visit.    Follow-up: Return in about 3 months (around 11/23/2024).  Barabara Maier, DO 08/24/2024 2:39 PM

## 2024-08-25 ENCOUNTER — Ambulatory Visit: Payer: Self-pay

## 2024-08-25 LAB — CERVICOVAGINAL ANCILLARY ONLY
Chlamydia: POSITIVE — AB
Comment: NEGATIVE
Comment: NEGATIVE
Comment: NORMAL
Neisseria Gonorrhea: POSITIVE — AB
Trichomonas: NEGATIVE

## 2024-08-25 LAB — HEPATITIS B SURFACE ANTIGEN: Hepatitis B Surface Ag: NEGATIVE

## 2024-08-25 LAB — RPR: RPR Ser Ql: NONREACTIVE

## 2024-08-25 LAB — HEPATITIS C ANTIBODY: Hep C Virus Ab: NONREACTIVE

## 2024-08-25 LAB — HIV ANTIBODY (ROUTINE TESTING W REFLEX): HIV Screen 4th Generation wRfx: NONREACTIVE

## 2024-08-25 MED ORDER — CEFTRIAXONE SODIUM 500 MG IJ SOLR: 500.0000 mg | Freq: Once | INTRAMUSCULAR | Status: AC

## 2024-08-25 MED ORDER — DOXYCYCLINE HYCLATE 100 MG PO CAPS: 100.0000 mg | ORAL_CAPSULE | Freq: Two times a day (BID) | ORAL | 0 refills | Status: AC

## 2024-08-30 LAB — CYTOLOGY - PAP
Comment: NEGATIVE
Diagnosis: NEGATIVE
High risk HPV: NEGATIVE

## 2024-08-31 ENCOUNTER — Encounter: Payer: Self-pay | Admitting: Obstetrics and Gynecology

## 2024-08-31 ENCOUNTER — Ambulatory Visit (INDEPENDENT_AMBULATORY_CARE_PROVIDER_SITE_OTHER)

## 2024-08-31 DIAGNOSIS — A5402 Gonococcal vulvovaginitis, unspecified: Secondary | ICD-10-CM | POA: Diagnosis not present

## 2024-08-31 MED ORDER — CEFTRIAXONE SODIUM 500 MG IJ SOLR
500.0000 mg | Freq: Once | INTRAMUSCULAR | Status: AC
  Administered 2024-08-31: 500 mg via INTRAMUSCULAR

## 2024-08-31 NOTE — Progress Notes (Signed)
 Pt presents for rocephin  injection. 500 mg of rocephin  injected in [facility]. Pt tolerated well. Pt aware of need for repeat swab at 3 months for TOC.

## 2024-11-23 ENCOUNTER — Other Ambulatory Visit (HOSPITAL_COMMUNITY)
Admission: RE | Admit: 2024-11-23 | Discharge: 2024-11-23 | Disposition: A | Payer: Self-pay | Source: Ambulatory Visit | Attending: Obstetrics and Gynecology | Admitting: Obstetrics and Gynecology

## 2024-11-23 ENCOUNTER — Ambulatory Visit (INDEPENDENT_AMBULATORY_CARE_PROVIDER_SITE_OTHER): Payer: Self-pay

## 2024-11-23 VITALS — BP 107/72 | HR 63 | Wt 162.5 lb

## 2024-11-23 DIAGNOSIS — Z113 Encounter for screening for infections with a predominantly sexual mode of transmission: Secondary | ICD-10-CM | POA: Insufficient documentation

## 2024-11-23 DIAGNOSIS — Z8619 Personal history of other infectious and parasitic diseases: Secondary | ICD-10-CM

## 2024-11-23 DIAGNOSIS — Z9079 Acquired absence of other genital organ(s): Secondary | ICD-10-CM | POA: Insufficient documentation

## 2024-11-23 NOTE — Progress Notes (Signed)
..  SUBJECTIVE:  33 y.o. female is in the office for [facility] for CT and GC. Pt states that she completed all antibiotics and has not been sexually active. Denies significant pelvic pain or fever. No UTI symptoms. Denies history of known exposure to STD.  LMP 10/24/24  OBJECTIVE:  She appears well, afebrile. Urine dipstick: not done.  ASSESSMENT:  TOC- Hx of CT and GC   PLAN:  GC, chlamydia, trichomonas, BVAG, CVAG probe sent to lab. Treatment: To be determined once lab results are received ROV prn if symptoms persist or worsen.

## 2024-11-24 LAB — CERVICOVAGINAL ANCILLARY ONLY
Bacterial Vaginitis (gardnerella): POSITIVE — AB
Candida Glabrata: NEGATIVE
Candida Vaginitis: NEGATIVE
Chlamydia: NEGATIVE
Comment: NEGATIVE
Comment: NEGATIVE
Comment: NEGATIVE
Comment: NEGATIVE
Comment: NEGATIVE
Comment: NORMAL
Neisseria Gonorrhea: NEGATIVE
Trichomonas: NEGATIVE

## 2024-11-25 ENCOUNTER — Ambulatory Visit: Payer: Self-pay | Admitting: Obstetrics and Gynecology

## 2024-11-25 MED ORDER — METRONIDAZOLE 500 MG PO TABS: 500.0000 mg | ORAL_TABLET | Freq: Two times a day (BID) | ORAL | 0 refills | Status: AC

## 2024-11-30 ENCOUNTER — Ambulatory Visit
